# Patient Record
Sex: Female | Born: 1940 | Race: White | Hispanic: No | Marital: Married | Smoking: Never smoker
Health system: Southern US, Community
[De-identification: ages and names within clinical notes are randomized; demographics above are authoritative.]

## PROBLEM LIST (undated history)

## (undated) DIAGNOSIS — E785 Hyperlipidemia, unspecified: Secondary | ICD-10-CM

## (undated) DIAGNOSIS — T7840XA Allergy, unspecified, initial encounter: Secondary | ICD-10-CM

## (undated) DIAGNOSIS — T4145XA Adverse effect of unspecified anesthetic, initial encounter: Secondary | ICD-10-CM

## (undated) DIAGNOSIS — M1990 Unspecified osteoarthritis, unspecified site: Secondary | ICD-10-CM

## (undated) DIAGNOSIS — K219 Gastro-esophageal reflux disease without esophagitis: Secondary | ICD-10-CM

## (undated) DIAGNOSIS — N390 Urinary tract infection, site not specified: Secondary | ICD-10-CM

## (undated) DIAGNOSIS — I10 Essential (primary) hypertension: Secondary | ICD-10-CM

## (undated) DIAGNOSIS — J45909 Unspecified asthma, uncomplicated: Secondary | ICD-10-CM

## (undated) DIAGNOSIS — T8859XA Other complications of anesthesia, initial encounter: Secondary | ICD-10-CM

## (undated) HISTORY — PX: CHOLECYSTECTOMY: SHX55

## (undated) HISTORY — DX: Allergy, unspecified, initial encounter: T78.40XA

## (undated) HISTORY — PX: EYE SURGERY: SHX253

## (undated) HISTORY — DX: Unspecified asthma, uncomplicated: J45.909

## (undated) HISTORY — DX: Hyperlipidemia, unspecified: E78.5

## (undated) HISTORY — DX: Urinary tract infection, site not specified: N39.0

## (undated) HISTORY — DX: Gastro-esophageal reflux disease without esophagitis: K21.9

## (undated) HISTORY — PX: JOINT REPLACEMENT: SHX530

## (undated) HISTORY — DX: Essential (primary) hypertension: I10

---

## 1997-09-25 HISTORY — PX: TOTAL HIP ARTHROPLASTY: SHX124

## 1998-01-20 ENCOUNTER — Ambulatory Visit (HOSPITAL_COMMUNITY): Admission: RE | Admit: 1998-01-20 | Discharge: 1998-01-20 | Payer: Self-pay | Admitting: Specialist

## 1998-08-03 ENCOUNTER — Encounter: Payer: Self-pay | Admitting: Orthopedic Surgery

## 1998-08-10 ENCOUNTER — Inpatient Hospital Stay (HOSPITAL_COMMUNITY): Admission: RE | Admit: 1998-08-10 | Discharge: 1998-08-13 | Payer: Self-pay | Admitting: Orthopedic Surgery

## 1998-08-10 ENCOUNTER — Encounter: Payer: Self-pay | Admitting: Orthopedic Surgery

## 1998-08-13 ENCOUNTER — Inpatient Hospital Stay (HOSPITAL_COMMUNITY)
Admission: RE | Admit: 1998-08-13 | Discharge: 1998-08-23 | Payer: Self-pay | Admitting: Physical Medicine and Rehabilitation

## 1998-08-14 ENCOUNTER — Encounter: Payer: Self-pay | Admitting: Physical Medicine and Rehabilitation

## 1998-08-15 ENCOUNTER — Encounter: Payer: Self-pay | Admitting: Physical Medicine and Rehabilitation

## 1998-08-18 ENCOUNTER — Encounter: Payer: Self-pay | Admitting: Orthopedic Surgery

## 1998-09-03 ENCOUNTER — Ambulatory Visit (HOSPITAL_COMMUNITY): Admission: RE | Admit: 1998-09-03 | Discharge: 1998-09-03 | Payer: Self-pay | Admitting: Specialist

## 2006-12-30 ENCOUNTER — Inpatient Hospital Stay (HOSPITAL_COMMUNITY): Admission: EM | Admit: 2006-12-30 | Discharge: 2007-01-02 | Payer: Self-pay | Admitting: Emergency Medicine

## 2007-04-22 ENCOUNTER — Encounter: Admission: RE | Admit: 2007-04-22 | Discharge: 2007-07-21 | Payer: Self-pay | Admitting: Orthopedic Surgery

## 2007-07-22 ENCOUNTER — Encounter: Admission: RE | Admit: 2007-07-22 | Discharge: 2007-09-25 | Payer: Self-pay | Admitting: Orthopedic Surgery

## 2007-09-26 ENCOUNTER — Encounter: Admission: RE | Admit: 2007-09-26 | Discharge: 2007-09-26 | Payer: Self-pay | Admitting: Orthopedic Surgery

## 2010-09-25 DIAGNOSIS — N390 Urinary tract infection, site not specified: Secondary | ICD-10-CM

## 2010-09-25 HISTORY — DX: Urinary tract infection, site not specified: N39.0

## 2011-02-10 NOTE — Discharge Summary (Signed)
Deborah Pruitt, Deborah Pruitt NO.:  [id number]   MEDICAL RECORD NO.:  [id number]          PATIENT TYPE:  INP   LOCATION:  5034                         FACILITY:  [facility]   PHYSICIAN:  Georges Lynch. [name], M.D.DATE OF BIRTH:  January 01, 1941   DATE OF ADMISSION:  12/30/2006  DATE OF DISCHARGE:  01/01/2007                               DISCHARGE SUMMARY   DISCHARGE DIAGNOSES:  1. Comminuted fracture over the left proximal tibia fibula.  2. Reflux disease.  3. Status post left total hip arthroplasty.   DISCHARGE DIAGNOSES:  1. Closed reduction with long leg casting of left proximal tibia      fibula fracture.  2. Reflux disease.  3. History of left total hip arthroplasty.   HISTORY OF PRESENT ILLNESS:  The patient is a 70 year old female who was  at home earlier the day of admission, missed a step going down the  stairs, fell twisting her left leg.  It was deformed, showed up to the  emergency room, x-rays were taken.  She was found to have a comminuted  fracture of the proximal left tib fib fracture closed.  It was elected  after evaluation, the patient's discussions, possible approach and her  current condition.  The patient elected to proceed with Dr. Darrelyn Hillock to  treat with closed manipulation and casting of her left lower extremity.  The patient was admitted for pain management.   ALLERGIES:  PREVACID.   CURRENT MEDICATIONS:  On admission Protonix 40 mg daily.   CONSULTATIONS:  Routine consults requested physical therapy, case  management.   PROCEDURE:  None.   HOSPITAL COURSE:  On December 30, 2006, the patient was admitted through the  emergency room of [facility] to the orthopedic floor on Dr.  Jeannetta Ellis service.  The patient was evaluated in the emergency room and  she was placed in a long leg cast on her left lower extremity.  The  patient then occurred 2 days followup on the hospital floor, which the  patient left lower extremity remained comfortable, her  exposed areas  remained intact.  She had pink, warm, neurovascularly intact toes.  Cast  was well-fitting.  The patient was sent for an MRI evaluation of the  knee, questionable quad tendon versus patella tendon tear.  An MRI came  back and both tendons were intact.  The patient was able to be  transitioned from IV pain medicines to p.o. meds well.  There were no  other untoward events.  She participated in the therapy and was able to  be discharged home on postop day #2 in good condition with outpatient  home health physical therapy arrangements and followup plan as  necessary.   IMAGING:  An EKG on admission was 61 beats per minute, sinus rhythm with  a sinus arrhythmia.  An MRI of the left lower extremity showed complex,  severe comminuted tibial fracture, fibular head and neck fractures.  Intact ligament structures, no meniscal tears.  Large lipohemia  arthrosis.  Intact quadriceps and patella tendons.  Distal patella  tendinopathy is noted.  Partial muscle tears involving the popliteus and  lateral head  of the gastroc.  The few patchy areas, significant  abnormality of the tib fib may be secondary to osteoporosis with some  patchy red marrow.  Can exclude metastatic disease.  Recommend clinical  correlation.   LABORATORY DATA:  A CBC on admission found the WBC was 8.4, hemoglobin  10.6, hematocrit 31.4, platelets 218.  INR was 1.6 on admission and 1.6  on discharge on Coumadin.  Routine chemistries on April 7, sodium of  138, potassium 3.7, glucose 126, BUN 7, creatinine 0.78.  Estimated JFR  was greater than 60.   ACTIVITY:  The patient is ambulate with the use of a walker,  nonweightbearing on left lower extremity.   DIET:  No restriction.   WOUND CARE:  The patient is to keep the left lower extremity elevated  and keep casting clean and dry.   FOLLOWUP:  The patient is to followup with Dr. Darrelyn Hillock the following  Monday.  The patient is to call [phone] for an  appointment.   MEDICATIONS:  1. Coumadin 5 mg daily.  2. Percocet 10/650 one tablet every 4 hours for pain as needed.  3. Protonix 40 mg daily as taken previously.  4. Multivitamins one tablet a day as previously taken.   DISPOSITION:  Home health physical therapy made with outpatient therapy  agency for ambulation training.   CONDITION ON DISCHARGE:  Improved and good.      Jamelle Rushing, P.A.    ______________________________  Georges Lynch Darrelyn Hillock, M.D.    RWK/MEDQ  D:  03/14/2007  T:  03/14/2007  Job:  [id number]

## 2011-02-10 NOTE — H&P (Signed)
NAMEJACEY, Deborah Pruitt NO.:  [id number]   MEDICAL RECORD NO.:  [id number]          PATIENT TYPE:  INP   LOCATION:  5034                         FACILITY:  [facility]   PHYSICIAN:  Georges Lynch. [name], M.D.DATE OF BIRTH:  23-Jan-1941   DATE OF ADMISSION:  12/30/2006  DATE OF DISCHARGE:                              HISTORY & PHYSICAL   CHIEF COMPLAINT:  Painful swollen left knee.   HISTORY OF PRESENT ILLNESS:  Patient is a 70 year old female who was at  home earlier today, missed a step going down the stairs when she fell  down twisting her left leg.  It was deformed.  She was having quite a  bit of pain, unable to get up off the floor.  Emergency Services were  called and she was transported to [facility] for evaluation.  Upon going to [facility], she was found to have a comminuted fracture of  the tib-fib left leg just below the tibial plateau, involving the tibial  plateau.   ALLERGIES:  PENICILLIN, MILK, AND PREVACID.   CURRENT MEDICATIONS:  Protonix 40 mg a day.   PAST MEDICAL HISTORY:  Includes GERD.   PAST SURGICAL HISTORY:  Left total hip arthroplasty in 1999.   SOCIAL HISTORY:  Patient is married.  Lives with her husband in a three-  story house.  She works as a Environmental manager.  She denies any smoking  or alcohol use.   REVIEW OF SYSTEMS:  Noncontributory in any respiratory, cardiac, GI, GU,  neurologic issues.   PHYSICAL EXAMINATION:  GENERAL:  Patient is a healthy-appearing well  developed female, conscious, alert, and appropriate.  Appears to be  uncomfortable on the Emergency Room gurney.  Her left leg does angulate  off to the lateral aspect just below the knee, significantly swollen.  HEENT:  Head was normocephalic.  Pupils equal, round and reactive.  Gross hearing is intact.  NECK:  Supple.  No palpable lymphadenopathy.  CHEST:  Clear and equal bilaterally.  No wheezes, rales, rhonchi.  HEART:  Regular rate and rhythm.  No murmurs,  rubs or gallops.  ABDOMEN:  Soft, nontender.  Bowel sounds present.  LOWER EXTREMITIES:  Her left knee was slightly angulated just distal to  the knee laterally, significantly swollen.  Motor intact lower extremity  at the foot.  The calf was significantly swollen but it was still soft.  She had good motion of her left hip without any discomfort.  Her right  lower extremity and her upper extremities were normal.  NEUROLOGIC:  She is neurologically intact.  VASCULAR:  Vascularly intact.   X-rays show that she had a complete fracture through the tibial shaft  just below the tibial plateau with fracture lines up through the tibial  plateau.  She also had a fibular fracture.  It was angulated in the  valgus deformity.   IMPRESSION:  Due to her current injuries and findings, Dr. Darrelyn Hillock would  like to treat her with a closed manipulation, so her leg was cleaned  without Betadine.  He injected 20 mL of 2% lidocaine into the fracture  region.  A closed  manipulation was performed.  A fiberglass cast was  placed.  It was bi-valved on the medial and lateral aspect.  Post  reduction x-rays appear that she does still have a little flexion at the  knee but the alignment is grossly still with a little varus deformity.  The fracture margins were overlapping.   DISPOSITION:  At this time Dr. Darrelyn Hillock is going to admit her to the  hospital for pain management.  He would also like to get MRI evaluation  of the knee.  Once the patient is comfortable, able to get up and move  around with physical therapy, we will make arrangements for Home Health  versus skilled care nursing.      Jamelle Rushing, P.A.    ______________________________  Georges Lynch Darrelyn Hillock, M.D.    RWK/MEDQ  D:  12/30/2006  T:  12/31/2006  Job:  [id number]   cc:   Windy Fast A. Darrelyn Hillock, M.D.

## 2013-01-03 ENCOUNTER — Ambulatory Visit (INDEPENDENT_AMBULATORY_CARE_PROVIDER_SITE_OTHER): Payer: BC Managed Care – PPO | Admitting: Nurse Practitioner

## 2013-01-03 DIAGNOSIS — T783XXA Angioneurotic edema, initial encounter: Secondary | ICD-10-CM

## 2013-01-03 DIAGNOSIS — R609 Edema, unspecified: Secondary | ICD-10-CM

## 2013-01-03 MED ORDER — METHYLPREDNISOLONE ACETATE 40 MG/ML IJ SUSP
40.0000 mg | Freq: Once | INTRAMUSCULAR | Status: AC
  Administered 2013-01-03: 40 mg via INTRAMUSCULAR

## 2013-01-03 MED ORDER — METHYLPREDNISOLONE ACETATE 80 MG/ML IJ SUSP: 80.0000 mg | Freq: Once | INTRAMUSCULAR | Status: DC

## 2013-01-03 NOTE — Progress Notes (Signed)
  Subjective:    Patient ID: Deborah Pruitt, female    DOB: [birth year], 72 y.o.   MRN: [id number]  HPI Patient in complaining of intermittent hives and swelling. Started 1 day ago but have been having recurring episode for 2-3 months. Benadryl and Zyrtec have helped to relieve the symptoms. No associated symptoms. Pt denies any change food, products, or medications. Hives described as itchy only, no pain. 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     Review of Systems  Constitutional: Negative.   HENT: Positive for facial swelling.   Respiratory: Negative.   Cardiovascular: Negative.   Genitourinary: Negative.   Skin: Positive for rash (around waist line, under bra, and in groin area).  Neurological: Negative.   Hematological: Negative.   Psychiatric/Behavioral: Negative.        Objective:   Physical Exam  Constitutional: She is oriented to person, place, and time. She appears well-developed and well-nourished.  HENT:  Head: Normocephalic.  Right Ear: External ear normal.  Left Ear: External ear normal.  Eyes: Pupils are equal, round, and reactive to light.  Cardiovascular: Normal rate, regular rhythm and normal heart sounds.   Pulmonary/Chest: Effort normal and breath sounds normal.  Neurological: She is alert and oriented to person, place, and time.  Skin: Skin is warm and dry.  angioedema  Psychiatric: She has a normal mood and affect. Her behavior is normal. Judgment and thought content normal.          Assessment & Plan:  1. Angioedema of lips, initial encounter Keep journal of food/beverages to determine cause of inflammation - methylPREDNISolone acetate (DEPO-MEDROL) injection 80 mg; Inject 1 mL (80 mg total) into the muscle once.  Mary-Margaret Daphine Deutscher, FNP

## 2013-01-03 NOTE — Patient Instructions (Addendum)
Angioedema Angioedema (AE) is a sudden swelling of the eyelids, lips, lobes of ears, external genitalia, skin, and other parts of the body. AE can happen by itself. It usually begins during the night and is found on awakening. It can happen with hives and other allergic reactions. Attacks can be mild and annoying, or life-threatening if the air passages swell. AE generally occurs in a short time period (over minutes to hours) and gets better in 24 to 48 hours. It usually does not cause any serious problems.  There are 2 different kinds of AE:   Allergic AE.  Nonallergic AE.  There may be an overreaction or direct stimulation of cells that are a part of the immune system (mast cells).  There may be problems with the release of chemicals made by the body that cause swelling and inflammation (kinins). AE due to kinins can be inherited from parents (hereditary), or it can develop on its own (acquired). Acquired AE either shows up before, or along with, certain diseases or is due to the body's immune system attacking parts of the body's own cells (autoimmune). CAUSES  Allergic  AE due to allergic reactions are caused by something that causes the body to react (trigger). Common triggers include:  Foods.  Medicines.  Latex.  Direct contact with certain fruits, vegetables, or animal saliva.  Insect stings. Nonallergic  Mast cell stimulation may be caused by:  Medicines.  Dyes used in X-rays.  The body's own immune system reactions to parts of the body (autoimmune disease).  Possibly, some virus infections.  AE due to problems with kinins can be hereditary or acquired. Attacks are triggered by:  Mild injury.  Dental work or any surgery.  Stress.  Sudden changes in temperature.  Exercise.  Medicines.  AE due to problems with kinins can also be due to certain medicines, especially blood pressure medicines like angiotensin-converting enzyme (ACE) inhibitors. African Americans  are at nearly 5 times greater risk of developing AE than Caucasians from ACE inhibitors. SYMPTOMS  Allergic symptoms:  Non-itchy swelling of the skin. Often the swelling is on the face and lips, but any area of the skin can swell. Sometimes, the swelling can be painful. If hives are present, there is intense itching.  Breathing problems if the air passages swell. Nonallergic symptoms:  If internal organs are involved, there may be:  Nausea.  Abdominal pain.  Vomiting.  Difficulty swallowing.  Difficulty passing urine.  Breathing problems if the air passages swell. Depending on the cause of AE, episodes may:  Only happen once (if triggers are removed or avoided).  Come back in unpredictable patterns.  Repeat for several years and then gradually fade away. DIAGNOSIS  AE is diagnosed by:   Asking questions to find out how fast the symptoms began.  Taking a family history.  Physical exam.  Diagnostic tests. Tests could include:  Allergy skin tests to see if the problem is allergic.  Blood tests to diagnose hereditary and some acquired types of AE.  Other tests to see if there is a hidden disease leading to the AE. TREATMENT  Treatment depends on the type and cause (if any) of the AE. Allergic  Allergic types of AE are treated with:  Immediate removal of the trigger or medicine (if any).  Epinephrine injection.  Steroids.  Antihistamines.  Hospitalization for severe attacks. Nonallergic  Mast cell stimulation types of AE are treated with:  Immediate removal of the trigger or medicine (if any).  Epinephrine injection.  Steroids.  Antihistamines.  Hospitalization for severe attacks.  Hereditary AE is treated with:  Medicines to prevent and treat attacks. There is little response to antihistamines, epinephrine, or steroids.  Preventive medicines before dental work or surgery.  Removing or avoiding medicines that trigger  attacks.  Hospitalization for severe attacks.  Acquired AE is treated with:  Treating underlying disease (if any).  Medicines to prevent and treat attacks. HOME CARE INSTRUCTIONS   Always carry your emergency allergy treatment medicines with you.  Wear a medical bracelet.  Avoid known triggers. SEEK MEDICAL CARE IF:   You get repeat attacks.  Your attacks are more frequent or more severe despite preventive measures.  You have hereditary AE and are considering having children. It is important to discuss the risks of passing this on to your children. SEEK IMMEDIATE MEDICAL CARE IF:   You have difficulty breathing.  You have difficulty swallowing.  You experience fainting. This condition should be treated immediately. It can be life-threatening if it involves throat swelling. Document Released: 11/20/2001 Document Revised: 12/04/2011 Document Reviewed: 09/10/2008 [facility] Patient Information 2013 [place], [place].   KEEP JOURNAL OF FOOD AND BEVERAGE YOU ARE INGESTING!!

## 2013-01-17 ENCOUNTER — Ambulatory Visit: Payer: Self-pay | Admitting: Nurse Practitioner

## 2013-01-25 ENCOUNTER — Other Ambulatory Visit: Payer: Self-pay | Admitting: Nurse Practitioner

## 2013-01-28 ENCOUNTER — Other Ambulatory Visit (INDEPENDENT_AMBULATORY_CARE_PROVIDER_SITE_OTHER): Payer: BC Managed Care – PPO

## 2013-01-28 DIAGNOSIS — Z0000 Encounter for general adult medical examination without abnormal findings: Secondary | ICD-10-CM

## 2013-01-28 DIAGNOSIS — E119 Type 2 diabetes mellitus without complications: Secondary | ICD-10-CM

## 2013-01-28 LAB — COMPREHENSIVE METABOLIC PANEL
ALT: 16 U/L (ref 0–35)
AST: 14 U/L (ref 0–37)
Calcium: 9.1 mg/dL (ref 8.4–10.5)
Chloride: 107 mEq/L (ref 96–112)
Creat: 0.83 mg/dL (ref 0.50–1.10)
Potassium: 4.5 mEq/L (ref 3.5–5.3)

## 2013-01-28 NOTE — Progress Notes (Unsigned)
Patient came in for labs only.

## 2013-01-30 ENCOUNTER — Encounter: Payer: Self-pay | Admitting: General Practice

## 2013-01-30 ENCOUNTER — Ambulatory Visit (INDEPENDENT_AMBULATORY_CARE_PROVIDER_SITE_OTHER): Payer: BC Managed Care – PPO | Admitting: General Practice

## 2013-01-30 VITALS — BP 148/76 | HR 75 | Temp 98.6°F | Ht 60.0 in | Wt 172.5 lb

## 2013-01-30 DIAGNOSIS — Z0000 Encounter for general adult medical examination without abnormal findings: Secondary | ICD-10-CM

## 2013-01-30 LAB — NMR LIPOPROFILE WITH LIPIDS
Cholesterol, Total: 151 mg/dL (ref <200)
HDL-C: 40 mg/dL (ref >=40)
LDL (calc): 89 mg/dL (ref <100)
LDL Particle Number: 1283 nmol/L — ABNORMAL HIGH (ref <1000)
LP-IR Score: 54 — ABNORMAL HIGH (ref <=45)
Small LDL Particle Number: 999 nmol/L — ABNORMAL HIGH (ref <=527)
Triglycerides: 111 mg/dL (ref <150)
VLDL Size: 48.7 nm — ABNORMAL HIGH (ref <=46.6)

## 2013-01-30 NOTE — Progress Notes (Signed)
  Subjective:    Patient ID: Deborah Pruitt, female    DOB: 1940/10/03, 72 y.o.   MRN: [id number]  HPI Presents today for physical examination. Reports taking medications as directed. Denies any complaints at this time. Reports having eyes examined. Reports being up to date with mammogram and will schedule pap.    Review of Systems  Constitutional: Negative for fever, chills, activity change, appetite change and fatigue.  HENT: Negative for ear pain, neck pain and neck stiffness.   Eyes: Negative for pain.  Respiratory: Negative for cough, chest tightness and shortness of breath.   Cardiovascular: Negative for chest pain.  Gastrointestinal: Negative for abdominal pain and abdominal distention.  Genitourinary: Negative for difficulty urinating and pelvic pain.  Musculoskeletal: Negative for back pain.  Skin: Negative.   Neurological: Negative for dizziness, syncope and headaches.  Psychiatric/Behavioral: Negative.        Objective:   Physical Exam  Constitutional: She is oriented to person, place, and time. She appears well-developed and well-nourished.  HENT:  Head: Normocephalic and atraumatic.  Right Ear: External ear normal.  Left Ear: External ear normal.  Nose: Nose normal.  Eyes: Conjunctivae and EOM are normal.  Neck: Normal range of motion. No thyromegaly present.  Cardiovascular: Normal rate, regular rhythm and normal heart sounds.   No murmur heard. Pulmonary/Chest: Effort normal and breath sounds normal.  Abdominal: Soft. She exhibits no distension and no mass. There is no tenderness. There is no rebound and no guarding.  Musculoskeletal: Normal range of motion.  Lymphadenopathy:    She has no cervical adenopathy.  Neurological: She is alert and oriented to person, place, and time.  Skin: Skin is warm and dry.  Psychiatric: She has a normal mood and affect.          Assessment & Plan:  Continue current medications Continue healthy diet and exercise   Patient verbalized understanding Coralie Keens, FNP-C

## 2013-01-30 NOTE — Patient Instructions (Signed)

## 2013-02-06 ENCOUNTER — Telehealth: Payer: Self-pay | Admitting: General Practice

## 2013-02-06 NOTE — Telephone Encounter (Signed)
pt aware of labs

## 2013-03-17 ENCOUNTER — Other Ambulatory Visit: Payer: Self-pay | Admitting: General Practice

## 2013-04-30 ENCOUNTER — Other Ambulatory Visit: Payer: Self-pay | Admitting: *Deleted

## 2013-04-30 ENCOUNTER — Other Ambulatory Visit: Payer: Self-pay | Admitting: General Practice

## 2013-04-30 MED ORDER — AMLODIPINE BESYLATE 10 MG PO TABS: 10.0000 mg | ORAL_TABLET | Freq: Every day | ORAL | Status: DC

## 2013-08-19 ENCOUNTER — Encounter (INDEPENDENT_AMBULATORY_CARE_PROVIDER_SITE_OTHER): Payer: Self-pay

## 2013-08-19 ENCOUNTER — Encounter: Payer: Self-pay | Admitting: Family Medicine

## 2013-08-19 ENCOUNTER — Ambulatory Visit (INDEPENDENT_AMBULATORY_CARE_PROVIDER_SITE_OTHER): Payer: BC Managed Care – PPO | Admitting: Family Medicine

## 2013-08-19 VITALS — BP 145/82 | HR 65 | Temp 98.4°F | Ht 60.0 in | Wt 174.0 lb

## 2013-08-19 DIAGNOSIS — I10 Essential (primary) hypertension: Secondary | ICD-10-CM

## 2013-08-19 DIAGNOSIS — Z23 Encounter for immunization: Secondary | ICD-10-CM

## 2013-08-19 DIAGNOSIS — E785 Hyperlipidemia, unspecified: Secondary | ICD-10-CM

## 2013-08-19 DIAGNOSIS — K219 Gastro-esophageal reflux disease without esophagitis: Secondary | ICD-10-CM | POA: Insufficient documentation

## 2013-08-19 MED ORDER — PANTOPRAZOLE SODIUM 40 MG PO TBEC: 40.0000 mg | DELAYED_RELEASE_TABLET | Freq: Every day | ORAL | Status: DC

## 2013-08-19 MED ORDER — AMLODIPINE BESYLATE 10 MG PO TABS: 10.0000 mg | ORAL_TABLET | Freq: Every day | ORAL | Status: DC

## 2013-08-19 MED ORDER — ATORVASTATIN CALCIUM 40 MG PO TABS: 40.0000 mg | ORAL_TABLET | Freq: Every day | ORAL | Status: DC

## 2013-08-19 MED ORDER — OMEGA-3-ACID ETHYL ESTERS 1 G PO CAPS: 1.0000 g | ORAL_CAPSULE | Freq: Every day | ORAL | Status: DC

## 2013-08-19 NOTE — Progress Notes (Signed)
Subjective:    Patient ID: Deborah Pruitt, female    DOB: [birth year], 72 y.o.   MRN: [id number]  Hypertension This is a chronic problem. The current episode started more than 1 year ago. The problem has been waxing and waning since onset. The problem is uncontrolled (Pt hasn't taken bp meds this AM). Associated symptoms include peripheral edema. Pertinent negatives include no anxiety, blurred vision, chest pain, headaches, palpitations or shortness of breath. Risk factors for coronary artery disease include dyslipidemia, post-menopausal state and family history. Past treatments include calcium channel blockers. The current treatment provides mild improvement. Compliance problems include exercise.  There is no history of CAD/MI or a thyroid problem. There is no history of sleep apnea.  Hyperlipidemia This is a chronic problem. The current episode started more than 1 year ago. The problem is controlled. Recent lipid tests were reviewed and are normal. She has no history of diabetes, hypothyroidism or nephrotic syndrome. Factors aggravating her hyperlipidemia include fatty foods. Pertinent negatives include no chest pain, focal sensory loss, leg pain or shortness of breath. Current antihyperlipidemic treatment includes statins and herbal therapy. The current treatment provides moderate improvement of lipids. Risk factors for coronary artery disease include dyslipidemia, family history, hypertension, obesity and post-menopausal.  Gastrophageal Reflux She reports no abdominal pain, no chest pain, no coughing, no heartburn or no nausea. This is a chronic problem. The current episode started more than 1 year ago. The problem occurs rarely. The problem has been resolved. The symptoms are aggravated by certain foods. Pertinent negatives include no fatigue, muscle weakness or orthopnea. She has tried a PPI for the symptoms. The treatment provided significant relief. Past invasive treatments do not include reflux  surgery.      Review of Systems  Constitutional: Negative for fatigue.  Eyes: Negative for blurred vision.  Respiratory: Negative.  Negative for cough and shortness of breath.   Cardiovascular: Negative.  Negative for chest pain and palpitations.  Gastrointestinal: Negative.  Negative for heartburn, nausea and abdominal pain.  Genitourinary: Negative.   Musculoskeletal: Negative for muscle weakness.  Neurological: Negative for headaches.  All other systems reviewed and are negative.       Objective:   Physical Exam  Vitals reviewed. Constitutional: She is oriented to person, place, and time. She appears well-developed and well-nourished.  HENT:  Head: Normocephalic.  Right Ear: External ear normal.  Left Ear: External ear normal.  Mouth/Throat: Oropharynx is clear and moist.  Eyes: Pupils are equal, round, and reactive to light.  Neck: Normal range of motion. Neck supple. No thyromegaly present.  Cardiovascular: Normal rate, regular rhythm, normal heart sounds and intact distal pulses.   No murmur heard. Pulmonary/Chest: Effort normal and breath sounds normal. No respiratory distress. She has no wheezes.  Abdominal: Soft. Bowel sounds are normal. She exhibits no distension. There is no tenderness.  Musculoskeletal: Normal range of motion. She exhibits no edema and no tenderness.  Neurological: She is alert and oriented to person, place, and time.  Skin: Skin is warm and dry. No rash noted. No erythema.  Psychiatric: She has a normal mood and affect. Her behavior is normal. Judgment and thought content normal.     BP 145/82  Pulse 65  Temp(Src) 98.4 F (36.9 C) (Oral)  Ht 5' (1.524 m)  Wt 174 lb (78.926 kg)  BMI 33.98 kg/m2      Assessment & Plan:  Hypertension - Plan: CMP14+EGFR  Hyperlipidemia LDL goal < 100 - Plan: CMP14+EGFR, Lipid panel  GERD (  gastroesophageal reflux disease)  Deatra Canter FNP

## 2013-08-19 NOTE — Addendum Note (Signed)
Addended by: Bearl Mulberry on: 08/19/2013 09:45 AM   Modules accepted: Orders

## 2013-08-19 NOTE — Patient Instructions (Signed)

## 2013-08-20 LAB — LIPID PANEL
Chol/HDL Ratio: 3.2 ratio units (ref 0.0–4.4)
Cholesterol, Total: 113 mg/dL (ref 100–199)
HDL: 35 mg/dL — ABNORMAL LOW (ref >39)
LDL Calculated: 62 mg/dL (ref 0–99)
Triglycerides: 81 mg/dL (ref 0–149)
VLDL Cholesterol Cal: 16 mg/dL (ref 5–40)

## 2013-08-20 LAB — CMP14+EGFR
ALT: 20 IU/L (ref 0–32)
AST: 17 IU/L (ref 0–40)
Albumin/Globulin Ratio: 2 (ref 1.1–2.5)
Albumin: 4.3 g/dL (ref 3.5–4.8)
Alkaline Phosphatase: 70 IU/L (ref 39–117)
BUN/Creatinine Ratio: 12 (ref 11–26)
BUN: 11 mg/dL (ref 8–27)
CO2: 23 mmol/L (ref 18–29)
Calcium: 9 mg/dL (ref 8.6–10.2)
Chloride: 105 mmol/L (ref 97–108)
Creatinine, Ser: 0.89 mg/dL (ref 0.57–1.00)
GFR calc Af Amer: 76 mL/min/{1.73_m2} (ref >59)
GFR calc non Af Amer: 66 mL/min/{1.73_m2} (ref >59)
Globulin, Total: 2.1 g/dL (ref 1.5–4.5)
Glucose: 110 mg/dL — ABNORMAL HIGH (ref 65–99)
Potassium: 4.5 mmol/L (ref 3.5–5.2)
Sodium: 143 mmol/L (ref 134–144)
Total Bilirubin: 0.5 mg/dL (ref 0.0–1.2)
Total Protein: 6.4 g/dL (ref 6.0–8.5)

## 2013-08-26 ENCOUNTER — Telehealth: Payer: Self-pay | Admitting: *Deleted

## 2013-08-26 NOTE — Telephone Encounter (Deleted)
Message copied by Bernita Buffy on Tue Aug 26, 2013 10:45 AM ------      Message from: Deatra Canter      Created: Mon Aug 25, 2013  1:35 PM       Labs ok ------

## 2013-09-04 NOTE — Telephone Encounter (Signed)
Taken care of per note 

## 2013-09-25 DIAGNOSIS — H269 Unspecified cataract: Secondary | ICD-10-CM

## 2013-09-25 HISTORY — DX: Unspecified cataract: H26.9

## 2013-10-16 ENCOUNTER — Ambulatory Visit (INDEPENDENT_AMBULATORY_CARE_PROVIDER_SITE_OTHER): Payer: Medicare Other | Admitting: Nurse Practitioner

## 2013-10-16 ENCOUNTER — Ambulatory Visit (INDEPENDENT_AMBULATORY_CARE_PROVIDER_SITE_OTHER): Payer: Medicare Other

## 2013-10-16 VITALS — BP 160/68 | HR 63 | Temp 98.1°F | Ht 60.0 in

## 2013-10-16 DIAGNOSIS — S93602A Unspecified sprain of left foot, initial encounter: Secondary | ICD-10-CM

## 2013-10-16 DIAGNOSIS — M25579 Pain in unspecified ankle and joints of unspecified foot: Secondary | ICD-10-CM

## 2013-10-16 DIAGNOSIS — S93609A Unspecified sprain of unspecified foot, initial encounter: Secondary | ICD-10-CM

## 2013-10-16 NOTE — Patient Instructions (Signed)
Sprain  A sprain is a tear in one of the strong, fibrous tissues that connect your bones (ligaments). The severity of the sprain depends on how much of the ligament is torn. The tear can be either partial or complete.  CAUSES   Often, sprains are a result of a fall or an injury. The force of the impact causes the fibers of your ligament to stretch beyond their normal length. This excess tension causes the fibers of your ligament to tear.  SYMPTOMS   You may have some loss of motion or increased pain within your normal range of motion. Other symptoms include:  · Bruising.  · Tenderness.  · Swelling.  DIAGNOSIS   In order to diagnose a sprain, your caregiver will physically examine you to determine how torn the ligament is. Your caregiver may also suggest an X-ray exam to make sure no bones are broken.  TREATMENT   If your ligament is only partially torn, treatment usually involves keeping the injured area in a fixed position (immobilization) for a short period. To do this, your caregiver will apply a bandage, cast, or splint to keep the area from moving until it heals. For a partially torn ligament, the healing process usually takes 2 to 3 weeks.  If your ligament is completely torn, you may need surgery to reconnect the ligament to the bone or to reconstruct the ligament. After surgery, a cast or splint may be applied and will need to stay on for 4 to 6 weeks while your ligament heals.  HOME CARE INSTRUCTIONS  · Keep the injured area elevated to decrease swelling.  · To ease pain and swelling, apply ice to your joint twice a day, for 2 to 3 days.  · Put ice in a plastic bag.  · Place a towel between your skin and the bag.  · Leave the ice on for 15 minutes.  · Only take over-the-counter or prescription medicine for pain as directed by your caregiver.  · Do not leave the injured area unprotected until pain and stiffness go away (usually 3 to 4 weeks).  · Do not allow your cast or splint to get wet. Cover your cast or  splint with a plastic bag when you shower or bathe. Do not swim.  · Your caregiver may suggest exercises for you to do during your recovery to prevent or limit permanent stiffness.  SEEK IMMEDIATE MEDICAL CARE IF:  · Your cast or splint becomes damaged.  · Your pain becomes worse.  MAKE SURE YOU:  · Understand these instructions.  · Will watch your condition.  · Will get help right away if you are not doing well or get worse.  Document Released: 09/08/2000 Document Revised: 12/04/2011 Document Reviewed: 09/23/2011  [employer]® Patient Information ©2014 [employer].

## 2013-10-16 NOTE — Progress Notes (Signed)
   Subjective:    Patient ID: Deborah Pruitt, female    DOB: 05/02/1941, 73 y.o.   MRN: [id number]  HPI  Patient in c/o twisting her left foot- painful to walk on. Happened earlier this morning.   Review of Systems  All other systems reviewed and are negative.       Objective:   Physical Exam  Constitutional: She appears well-developed and well-nourished.  Cardiovascular: Normal rate, regular rhythm and normal heart sounds.   Pulmonary/Chest: Effort normal and breath sounds normal.  Musculoskeletal:  From of left foot with pain on dorsal surface with movement in any direction- mild edema along talus with tenderness to palpation.  BP 160/68  Pulse 63  Temp(Src) 98.1 F (36.7 C) (Oral)  Ht 5' (1.524 m)   Left foot xray- no fracture sen-Preliminary reading by Paulene FloorMary Elaysia Devargas, FNP  [facility]       Assessment & Plan:   1. Pain in joint, ankle and foot   2. Sprain of foot, left    Tylenol OTC Wrap foot when up Elevate and ice TID RTO prn Deborah Daphine DeutscherMartin, FNP

## 2014-01-14 ENCOUNTER — Other Ambulatory Visit: Payer: Self-pay | Admitting: General Practice

## 2014-01-15 NOTE — Telephone Encounter (Signed)
ntbs for antibiotic refill 

## 2014-01-15 NOTE — Telephone Encounter (Signed)
Last seen 10/16/13  MMM

## 2014-02-22 ENCOUNTER — Other Ambulatory Visit: Payer: Self-pay | Admitting: Nurse Practitioner

## 2014-02-23 NOTE — Telephone Encounter (Signed)
Seen 08/19/13 and lipid 08/19/13  [facility] [place]

## 2014-05-12 ENCOUNTER — Other Ambulatory Visit: Payer: Self-pay | Admitting: Family Medicine

## 2014-05-21 ENCOUNTER — Ambulatory Visit (INDEPENDENT_AMBULATORY_CARE_PROVIDER_SITE_OTHER): Payer: Medicare Other | Admitting: Family

## 2014-05-21 ENCOUNTER — Encounter: Payer: Self-pay | Admitting: Family

## 2014-05-21 VITALS — BP 179/78 | HR 62 | Temp 98.4°F | Ht 60.0 in | Wt 159.0 lb

## 2014-05-21 DIAGNOSIS — Z13820 Encounter for screening for osteoporosis: Secondary | ICD-10-CM

## 2014-05-21 DIAGNOSIS — K219 Gastro-esophageal reflux disease without esophagitis: Secondary | ICD-10-CM

## 2014-05-21 DIAGNOSIS — Z1321 Encounter for screening for nutritional disorder: Secondary | ICD-10-CM

## 2014-05-21 DIAGNOSIS — I10 Essential (primary) hypertension: Secondary | ICD-10-CM

## 2014-05-21 DIAGNOSIS — E785 Hyperlipidemia, unspecified: Secondary | ICD-10-CM

## 2014-05-21 DIAGNOSIS — Z1211 Encounter for screening for malignant neoplasm of colon: Secondary | ICD-10-CM

## 2014-05-21 MED ORDER — PANTOPRAZOLE SODIUM 40 MG PO TBEC
40.0000 mg | DELAYED_RELEASE_TABLET | Freq: Every day | ORAL | Status: DC
Start: 2014-05-21 — End: 2015-05-12

## 2014-05-21 MED ORDER — HYDROCHLOROTHIAZIDE 12.5 MG PO TABS: 12.5000 mg | ORAL_TABLET | Freq: Every day | ORAL | Status: DC

## 2014-05-21 MED ORDER — AMLODIPINE BESYLATE 10 MG PO TABS: 10.0000 mg | ORAL_TABLET | Freq: Every day | ORAL | Status: DC

## 2014-05-21 MED ORDER — OMEGA-3-ACID ETHYL ESTERS 1 G PO CAPS: ORAL_CAPSULE | ORAL | Status: DC

## 2014-05-21 NOTE — Patient Instructions (Signed)
Hypertension Hypertension, commonly called high blood pressure, is when the force of blood pumping through your arteries is too strong. Your arteries are the blood vessels that carry blood from your heart throughout your body. A blood pressure reading consists of a higher number over a lower number, such as 110/72. The higher number (systolic) is the pressure inside your arteries when your heart pumps. The lower number (diastolic) is the pressure inside your arteries when your heart relaxes. Ideally you want your blood pressure below 120/80. Hypertension forces your heart to work harder to pump blood. Your arteries may become narrow or stiff. Having hypertension puts you at risk for heart disease, stroke, and other problems.  RISK FACTORS Some risk factors for high blood pressure are controllable. Others are not.  Risk factors you cannot control include:   Race. You may be at higher risk if you are African American.  Age. Risk increases with age.  Gender. Men are at higher risk than women before age 45 years. After age 65, women are at higher risk than men. Risk factors you can control include:  Not getting enough exercise or physical activity.  Being overweight.  Getting too much fat, sugar, calories, or salt in your diet.  Drinking too much alcohol. SIGNS AND SYMPTOMS Hypertension does not usually cause signs or symptoms. Extremely high blood pressure (hypertensive crisis) may cause headache, anxiety, shortness of breath, and nosebleed. DIAGNOSIS  To check if you have hypertension, your health care provider will measure your blood pressure while you are seated, with your arm held at the level of your heart. It should be measured at least twice using the same arm. Certain conditions can cause a difference in blood pressure between your right and left arms. A blood pressure reading that is higher than normal on one occasion does not mean that you need treatment. If one blood pressure reading  is high, ask your health care provider about having it checked again. TREATMENT  Treating high blood pressure includes making lifestyle changes and possibly taking medicine. Living a healthy lifestyle can help lower high blood pressure. You may need to change some of your habits. Lifestyle changes may include:  Following the DASH diet. This diet is high in fruits, vegetables, and whole grains. It is low in salt, red meat, and added sugars.  Getting at least 2 hours of brisk physical activity every week.  Losing weight if necessary.  Not smoking.  Limiting alcoholic beverages.  Learning ways to reduce stress. If lifestyle changes are not enough to get your blood pressure under control, your health care provider may prescribe medicine. You may need to take more than one. Work closely with your health care provider to understand the risks and benefits. HOME CARE INSTRUCTIONS  Have your blood pressure rechecked as directed by your health care provider.   Take medicines only as directed by your health care provider. Follow the directions carefully. Blood pressure medicines must be taken as prescribed. The medicine does not work as well when you skip doses. Skipping doses also puts you at risk for problems.   Do not smoke.   Monitor your blood pressure at home as directed by your health care provider. SEEK MEDICAL CARE IF:   You think you are having a reaction to medicines taken.  You have recurrent headaches or feel dizzy.  You have swelling in your ankles.  You have trouble with your vision. SEEK IMMEDIATE MEDICAL CARE IF:  You develop a severe headache or confusion.    You have unusual weakness, numbness, or feel faint.  You have severe chest or abdominal pain.  You vomit repeatedly.  You have trouble breathing. MAKE SURE YOU:   Understand these instructions.  Will watch your condition.  Will get help right away if you are not doing well or get worse. Document  Released: 09/11/2005 Document Revised: 01/26/2014 Document Reviewed: 07/04/2013 [facility] Patient Information 2015 [place], [place]. This information is not intended to replace advice given to you by your health care provider. Make sure you discuss any questions you have with your health care provider. Health Maintenance Adopting a healthy lifestyle and getting preventive care can go a long way to promote health and wellness. Talk with your health care provider about what schedule of regular examinations is right for you. This is a good chance for you to check in with your provider about disease prevention and staying healthy. In between checkups, there are plenty of things you can do on your own. Experts have done a lot of research about which lifestyle changes and preventive measures are most likely to keep you healthy. Ask your health care provider for more information. WEIGHT AND DIET  Eat a healthy diet  Be sure to include plenty of vegetables, fruits, low-fat dairy products, and lean protein.  Do not eat a lot of foods high in solid fats, added sugars, or salt.  Get regular exercise. This is one of the most important things you can do for your health.  Most adults should exercise for at least 150 minutes each week. The exercise should increase your heart rate and make you sweat (moderate-intensity exercise).  Most adults should also do strengthening exercises at least twice a week. This is in addition to the moderate-intensity exercise.  Maintain a healthy weight  Body mass index (BMI) is a measurement that can be used to identify possible weight problems. It estimates body fat based on height and weight. Your health care provider can help determine your BMI and help you achieve or maintain a healthy weight.  For females 71 years of age and older:   A BMI below 18.5 is considered underweight.  A BMI of 18.5 to 24.9 is normal.  A BMI of 25 to 29.9 is considered overweight.  A BMI of  30 and above is considered obese.  Watch levels of cholesterol and blood lipids  You should start having your blood tested for lipids and cholesterol at 71 years of age, then have this test every 5 years.  You may need to have your cholesterol levels checked more often if:  Your lipid or cholesterol levels are high.  You are older than 70 years of age.  You are at high risk for heart disease.  CANCER SCREENING   Lung Cancer  Lung cancer screening is recommended for adults 21-41 years old who are at high risk for lung cancer because of a history of smoking.  A yearly low-dose CT scan of the lungs is recommended for people who:  Currently smoke.  Have quit within the past 15 years.  Have at least a 30-pack-year history of smoking. A pack year is smoking an average of one pack of cigarettes a day for 1 year.  Yearly screening should continue until it has been 15 years since you quit.  Yearly screening should stop if you develop a health problem that would prevent you from having lung cancer treatment.  Breast Cancer  Practice breast self-awareness. This means understanding how your breasts normally appear and  feel.  It also means doing regular breast self-exams. Let your health care provider know about any changes, no matter how small.  If you are in your 20s or 30s, you should have a clinical breast exam (CBE) by a health care provider every 1-3 years as part of a regular health exam.  If you are 75 or older, have a CBE every year. Also consider having a breast X-ray (mammogram) every year.  If you have a family history of breast cancer, talk to your health care provider about genetic screening.  If you are at high risk for breast cancer, talk to your health care provider about having an MRI and a mammogram every year.  Breast cancer gene (BRCA) assessment is recommended for women who have family members with BRCA-related cancers. BRCA-related cancers  include:  Breast.  Ovarian.  Tubal.  Peritoneal cancers.  Results of the assessment will determine the need for genetic counseling and BRCA1 and BRCA2 testing. Cervical Cancer Routine pelvic examinations to screen for cervical cancer are no longer recommended for nonpregnant women who are considered low risk for cancer of the pelvic organs (ovaries, uterus, and vagina) and who do not have symptoms. A pelvic examination may be necessary if you have symptoms including those associated with pelvic infections. Ask your health care provider if a screening pelvic exam is right for you.   The Pap test is the screening test for cervical cancer for women who are considered at risk.  If you had a hysterectomy for a problem that was not cancer or a condition that could lead to cancer, then you no longer need Pap tests.  If you are older than 65 years, and you have had normal Pap tests for the past 10 years, you no longer need to have Pap tests.  If you have had past treatment for cervical cancer or a condition that could lead to cancer, you need Pap tests and screening for cancer for at least 20 years after your treatment.  If you no longer get a Pap test, assess your risk factors if they change (such as having a new sexual partner). This can affect whether you should start being screened again.  Some women have medical problems that increase their chance of getting cervical cancer. If this is the case for you, your health care provider may recommend more frequent screening and Pap tests.  The human papillomavirus (HPV) test is another test that may be used for cervical cancer screening. The HPV test looks for the virus that can cause cell changes in the cervix. The cells collected during the Pap test can be tested for HPV.  The HPV test can be used to screen women 62 years of age and older. Getting tested for HPV can extend the interval between normal Pap tests from three to five years.  An HPV  test also should be used to screen women of any age who have unclear Pap test results.  After 71 years of age, women should have HPV testing as often as Pap tests.  Colorectal Cancer  This type of cancer can be detected and often prevented.  Routine colorectal cancer screening usually begins at 71 years of age and continues through 71 years of age.  Your health care provider may recommend screening at an earlier age if you have risk factors for colon cancer.  Your health care provider may also recommend using home test kits to check for hidden blood in the stool.  A small camera  at the end of a tube can be used to examine your colon directly (sigmoidoscopy or colonoscopy). This is done to check for the earliest forms of colorectal cancer.  Routine screening usually begins at age 28.  Direct examination of the colon should be repeated every 5-10 years through 71 years of age. However, you may need to be screened more often if early forms of precancerous polyps or small growths are found. Skin Cancer  Check your skin from head to toe regularly.  Tell your health care provider about any new moles or changes in moles, especially if there is a change in a mole's shape or color.  Also tell your health care provider if you have a mole that is larger than the size of a pencil eraser.  Always use sunscreen. Apply sunscreen liberally and repeatedly throughout the day.  Protect yourself by wearing long sleeves, pants, a wide-brimmed hat, and sunglasses whenever you are outside. HEART DISEASE, DIABETES, AND HIGH BLOOD PRESSURE   Have your blood pressure checked at least every 1-2 years. High blood pressure causes heart disease and increases the risk of stroke.  If you are between 20 years and 33 years old, ask your health care provider if you should take aspirin to prevent strokes.  Have regular diabetes screenings. This involves taking a blood sample to check your fasting blood sugar  level.  If you are at a normal weight and have a low risk for diabetes, have this test once every three years after 71 years of age.  If you are overweight and have a high risk for diabetes, consider being tested at a younger age or more often. PREVENTING INFECTION  Hepatitis B  If you have a higher risk for hepatitis B, you should be screened for this virus. You are considered at high risk for hepatitis B if:  You were born in a country where hepatitis B is common. Ask your health care provider which countries are considered high risk.  Your parents were born in a high-risk country, and you have not been immunized against hepatitis B (hepatitis B vaccine).  You have HIV or AIDS.  You use needles to inject street drugs.  You live with someone who has hepatitis B.  You have had sex with someone who has hepatitis B.  You get hemodialysis treatment.  You take certain medicines for conditions, including cancer, organ transplantation, and autoimmune conditions. Hepatitis C  Blood testing is recommended for:  Everyone born from [birth year] through 1963.  Anyone with known risk factors for hepatitis C. Sexually transmitted infections (STIs)  You should be screened for sexually transmitted infections (STIs) including gonorrhea and chlamydia if:  You are sexually active and are younger than 71 years of age.  You are older than 71 years of age and your health care provider tells you that you are at risk for this type of infection.  Your sexual activity has changed since you were last screened and you are at an increased risk for chlamydia or gonorrhea. Ask your health care provider if you are at risk.  If you do not have HIV, but are at risk, it may be recommended that you take a prescription medicine daily to prevent HIV infection. This is called pre-exposure prophylaxis (PrEP). You are considered at risk if:  You are sexually active and do not regularly use condoms or know the HIV status  of your partner(s).  You take drugs by injection.  You are sexually active with a partner  who has HIV. Talk with your health care provider about whether you are at high risk of being infected with HIV. If you choose to begin PrEP, you should first be tested for HIV. You should then be tested every 3 months for as long as you are taking PrEP.  PREGNANCY   If you are premenopausal and you may become pregnant, ask your health care provider about preconception counseling.  If you may become pregnant, take 400 to 800 micrograms (mcg) of folic acid every day.  If you want to prevent pregnancy, talk to your health care provider about birth control (contraception). OSTEOPOROSIS AND MENOPAUSE   Osteoporosis is a disease in which the bones lose minerals and strength with aging. This can result in serious bone fractures. Your risk for osteoporosis can be identified using a bone density scan.  If you are 38 years of age or older, or if you are at risk for osteoporosis and fractures, ask your health care provider if you should be screened.  Ask your health care provider whether you should take a calcium or vitamin D supplement to lower your risk for osteoporosis.  Menopause may have certain physical symptoms and risks.  Hormone replacement therapy may reduce some of these symptoms and risks. Talk to your health care provider about whether hormone replacement therapy is right for you.  HOME CARE INSTRUCTIONS   Schedule regular health, dental, and eye exams.  Stay current with your immunizations.   Do not use any tobacco products including cigarettes, chewing tobacco, or electronic cigarettes.  If you are pregnant, do not drink alcohol.  If you are breastfeeding, limit how much and how often you drink alcohol.  Limit alcohol intake to no more than 1 drink per day for nonpregnant women. One drink equals 12 ounces of beer, 5 ounces of wine, or 1 ounces of hard liquor.  Do not use street  drugs.  Do not share needles.  Ask your health care provider for help if you need support or information about quitting drugs.  Tell your health care provider if you often feel depressed.  Tell your health care provider if you have ever been abused or do not feel safe at home. Document Released: 03/27/2011 Document Revised: 01/26/2014 Document Reviewed: 08/13/2013 [facility] Patient Information 2015 [place], [place]. This information is not intended to replace advice given to you by your health care provider. Make sure you discuss any questions you have with your health care provider.

## 2014-05-21 NOTE — Progress Notes (Signed)
Subjective:    Patient ID: Deborah Pruitt, female    DOB: 03-12-1941, 73 y.o.   MRN: [id number]  Hypertension This is a chronic problem. The current episode started more than 1 year ago. The problem has been waxing and waning since onset. The problem is uncontrolled. Pertinent negatives include no anxiety, headaches, palpitations, peripheral edema or shortness of breath. Risk factors for coronary artery disease include dyslipidemia, post-menopausal state and family history. Past treatments include calcium channel blockers. The current treatment provides no improvement. There is no history of kidney disease, CAD/MI, CVA, heart failure or a thyroid problem. There is no history of sleep apnea.  Hyperlipidemia This is a chronic problem. The current episode started more than 1 year ago. The problem is uncontrolled. Recent lipid tests were reviewed and are high. She has no history of diabetes or hypothyroidism. Factors aggravating her hyperlipidemia include fatty foods. Pertinent negatives include no leg pain, myalgias or shortness of breath. Current antihyperlipidemic treatment includes herbal therapy. The current treatment provides mild improvement of lipids. Risk factors for coronary artery disease include dyslipidemia, hypertension, a sedentary lifestyle and family history.  Gastrophageal Reflux She reports no belching, no choking, no heartburn, no sore throat or no water brash. This is a chronic problem. The current episode started more than 1 year ago. The problem occurs rarely. The problem has been resolved. The symptoms are aggravated by certain foods. Pertinent negatives include no muscle weakness or orthopnea. She has tried a PPI for the symptoms. The treatment provided significant relief.      Review of Systems  Constitutional: Negative.   HENT: Negative.  Negative for sore throat.   Eyes: Negative.   Respiratory: Negative.  Negative for choking and shortness of breath.     Cardiovascular: Negative.  Negative for palpitations.  Gastrointestinal: Negative.  Negative for heartburn.  Endocrine: Negative.   Genitourinary: Negative.   Musculoskeletal: Negative.  Negative for muscle weakness and myalgias.  Neurological: Negative.  Negative for headaches.  Hematological: Negative.   Psychiatric/Behavioral: Negative.   All other systems reviewed and are negative.      Objective:   Physical Exam  Vitals reviewed. Constitutional: She is oriented to person, place, and time. She appears well-developed and well-nourished. No distress.  HENT:  Head: Normocephalic and atraumatic.  Right Ear: External ear normal.  Left Ear: External ear normal.  Nose: Nose normal.  Mouth/Throat: Oropharynx is clear and moist.  Eyes: Pupils are equal, round, and reactive to light.  Neck: Normal range of motion. Neck supple. No thyromegaly present.  Cardiovascular: Normal rate, regular rhythm, normal heart sounds and intact distal pulses.   No murmur heard. Pulmonary/Chest: Effort normal and breath sounds normal. No respiratory distress. She has no wheezes.  Abdominal: Soft. Bowel sounds are normal. She exhibits no distension. There is no tenderness.  Musculoskeletal: Normal range of motion. She exhibits no edema and no tenderness.  Neurological: She is alert and oriented to person, place, and time. She has normal reflexes. No cranial nerve deficit.  Skin: Skin is warm and dry.  Psychiatric: She has a normal mood and affect. Her behavior is normal. Judgment and thought content normal.   BP 179/78  Pulse 62  Temp(Src) 98.4 F (36.9 C) (Oral)  Ht 5' (1.524 m)  Wt 159 lb (72.122 kg)  BMI 31.05 kg/m2        Assessment & Plan:  1. Gastroesophageal reflux disease without esophagitis - CMP14+EGFR - pantoprazole (PROTONIX) 40 MG tablet; Take 1 tablet (40  mg total) by mouth daily. As needed  Dispense: 30 tablet; Refill: 11  2. Essential hypertension - CMP14+EGFR - amLODipine  (NORVASC) 10 MG tablet; Take 1 tablet (10 mg total) by mouth daily.  Dispense: 30 tablet; Refill: 11 - hydrochlorothiazide (HYDRODIURIL) 12.5 MG tablet; Take 1 tablet (12.5 mg total) by mouth daily.  Dispense: 90 tablet; Refill: 3  3. Hyperlipidemia with target LDL less than 100 - CMP14+EGFR - Lipid panel - omega-3 acid ethyl esters (LOVAZA) 1 G capsule; TAKE 2 CAPSULES BY MOUTH TWICE A DAY  Dispense: 120 capsule; Refill: 11  4. Colon cancer screening - Ambulatory referral to Gastroenterology - CMP14+EGFR  5. Osteoporosis screening - DG Bone Density; Future - CMP14+EGFR  6. Encounter for vitamin deficiency screening - Vit D  25 hydroxy (rtn osteoporosis monitoring)   Continue all meds Labs pending Health Maintenance reviewed Diet and exercise encouraged RTO 2 for blood pressure recheck  Evelina Dun, FNP

## 2014-05-22 LAB — CMP14+EGFR
ALK PHOS: 60 IU/L (ref 39–117)
ALT: 17 IU/L (ref 0–32)
AST: 20 IU/L (ref 0–40)
Albumin/Globulin Ratio: 1.9 (ref 1.1–2.5)
Albumin: 4.4 g/dL (ref 3.5–4.8)
BUN / CREAT RATIO: 15 (ref 11–26)
BUN: 13 mg/dL (ref 8–27)
CALCIUM: 9.3 mg/dL (ref 8.7–10.3)
CHLORIDE: 104 mmol/L (ref 97–108)
CO2: 22 mmol/L (ref 18–29)
Creatinine, Ser: 0.84 mg/dL (ref 0.57–1.00)
GFR calc Af Amer: 81 mL/min/{1.73_m2} (ref >59)
GFR calc non Af Amer: 71 mL/min/{1.73_m2} (ref >59)
GLOBULIN, TOTAL: 2.3 g/dL (ref 1.5–4.5)
Glucose: 102 mg/dL — ABNORMAL HIGH (ref 65–99)
POTASSIUM: 4.2 mmol/L (ref 3.5–5.2)
SODIUM: 143 mmol/L (ref 134–144)
Total Bilirubin: 0.6 mg/dL (ref 0.0–1.2)
Total Protein: 6.7 g/dL (ref 6.0–8.5)

## 2014-05-22 LAB — LIPID PANEL
CHOL/HDL RATIO: 5.9 ratio — AB (ref 0.0–4.4)
CHOLESTEROL TOTAL: 190 mg/dL (ref 100–199)
HDL: 32 mg/dL — ABNORMAL LOW (ref >39)
LDL Calculated: 119 mg/dL — ABNORMAL HIGH (ref 0–99)
TRIGLYCERIDES: 197 mg/dL — AB (ref 0–149)
VLDL Cholesterol Cal: 39 mg/dL (ref 5–40)

## 2014-05-22 LAB — VITAMIN D 25 HYDROXY (VIT D DEFICIENCY, FRACTURES): Vit D, 25-Hydroxy: 55.4 ng/mL (ref 30.0–100.0)

## 2014-05-25 ENCOUNTER — Telehealth: Payer: Self-pay | Admitting: Family Medicine

## 2014-05-27 ENCOUNTER — Encounter: Payer: Self-pay | Admitting: Internal Medicine

## 2014-06-04 ENCOUNTER — Ambulatory Visit (INDEPENDENT_AMBULATORY_CARE_PROVIDER_SITE_OTHER): Payer: Medicare Other | Admitting: *Deleted

## 2014-06-04 VITALS — BP 141/75 | HR 66

## 2014-06-04 DIAGNOSIS — I10 Essential (primary) hypertension: Secondary | ICD-10-CM

## 2014-06-05 ENCOUNTER — Other Ambulatory Visit: Payer: Self-pay | Admitting: Family

## 2014-06-05 MED ORDER — HYDROCHLOROTHIAZIDE 25 MG PO TABS: 25.0000 mg | ORAL_TABLET | Freq: Every day | ORAL | Status: DC

## 2014-06-05 NOTE — Progress Notes (Signed)
Left detailed message on voicemail.  

## 2014-06-05 NOTE — Progress Notes (Signed)
Pt's blood pressure still elevated. Pt needs to continue amlodipine 10 mg daily and I increased  Hydrochlorothiazide to 25 mg daily. RX sent to pharmacy. Pt will need blood pressure rechecked within next 2 weeks or so.

## 2014-06-29 ENCOUNTER — Other Ambulatory Visit: Payer: Self-pay | Admitting: Family Medicine

## 2014-07-17 ENCOUNTER — Encounter (HOSPITAL_COMMUNITY): Payer: Self-pay | Admitting: Pharmacy Technician

## 2014-07-17 NOTE — Patient Instructions (Signed)
Your procedure is scheduled on: 07/23/2014  Report to [facility] at  930  AM.  Call this number if you have problems the morning of surgery: [phone]   Do not eat food or drink liquids :After Midnight.      Take these medicines the morning of surgery with A SIP OF WATER: amlodipine, hydrodiuril, protonix   Do not wear jewelry, make-up or nail polish.  Do not wear lotions, powders, or perfumes.   Do not shave 48 hours prior to surgery.  Do not bring valuables to the hospital.  Contacts, dentures or bridgework may not be worn into surgery.  Leave suitcase in the car. After surgery it may be brought to your room.  For patients admitted to the hospital, checkout time is 11:00 AM the day of discharge.   Patients discharged the day of surgery will not be allowed to drive home.  :     Please read over the following fact sheets that you were given: Coughing and Deep Breathing, Surgical Site Infection Prevention, Anesthesia Post-op Instructions and Care and Recovery After Surgery    Cataract A cataract is a clouding of the lens of the eye. When a lens becomes cloudy, vision is reduced based on the degree and nature of the clouding. Many cataracts reduce vision to some degree. Some cataracts make people more near-sighted as they develop. Other cataracts increase glare. Cataracts that are ignored and become worse can sometimes look white. The white color can be seen through the pupil. CAUSES   Aging. However, cataracts may occur at any age, even in newborns.   Certain drugs.   Trauma to the eye.   Certain diseases such as diabetes.   Specific eye diseases such as chronic inflammation inside the eye or a sudden attack of a rare form of glaucoma.   Inherited or acquired medical problems.  SYMPTOMS   Gradual, progressive drop in vision in the affected eye.   Severe, rapid visual loss. This most often happens when trauma is the cause.  DIAGNOSIS  To detect a cataract, an eye doctor  examines the lens. Cataracts are best diagnosed with an exam of the eyes with the pupils enlarged (dilated) by drops.  TREATMENT  For an early cataract, vision may improve by using different eyeglasses or stronger lighting. If that does not help your vision, surgery is the only effective treatment. A cataract needs to be surgically removed when vision loss interferes with your everyday activities, such as driving, reading, or watching TV. A cataract may also have to be removed if it prevents examination or treatment of another eye problem. Surgery removes the cloudy lens and usually replaces it with a substitute lens (intraocular lens, IOL).  At a time when both you and your doctor agree, the cataract will be surgically removed. If you have cataracts in both eyes, only one is usually removed at a time. This allows the operated eye to heal and be out of danger from any possible problems after surgery (such as infection or poor wound healing). In rare cases, a cataract may be doing damage to your eye. In these cases, your caregiver may advise surgical removal right away. The vast majority of people who have cataract surgery have better vision afterward. HOME CARE INSTRUCTIONS  If you are not planning surgery, you may be asked to do the following:  Use different eyeglasses.   Use stronger or brighter lighting.   Ask your eye doctor about reducing your medicine dose or changing  medicines if it is thought that a medicine caused your cataract. Changing medicines does not make the cataract go away on its own.   Become familiar with your surroundings. Poor vision can lead to injury. Avoid bumping into things on the affected side. You are at a higher risk for tripping or falling.   Exercise extreme care when driving or operating machinery.   Wear sunglasses if you are sensitive to bright [place] or experiencing problems with glare.  SEEK IMMEDIATE MEDICAL CARE IF:   You have a worsening or sudden vision  loss.   You notice redness, swelling, or increasing pain in the eye.   You have a fever.  Document Released: 09/11/2005 Document Revised: 08/31/2011 Document Reviewed: 05/05/2011 [facility] Patient Information 2012 [email or web address] INSTRUCTIONS POST-ANESTHESIA  IMMEDIATELY FOLLOWING SURGERY:  Do not drive or operate machinery for the first twenty four hours after surgery.  Do not make any important decisions for twenty four hours after surgery or while taking narcotic pain medications or sedatives.  If you develop intractable nausea and vomiting or a severe headache please notify your doctor immediately.  FOLLOW-UP:  Please make an appointment with your surgeon as instructed. You do not need to follow up with anesthesia unless specifically instructed to do so.  WOUND CARE INSTRUCTIONS (if applicable):  Keep a dry clean dressing on the anesthesia/puncture wound site if there is drainage.  Once the wound has quit draining you may leave it open to air.  Generally you should leave the bandage intact for twenty four hours unless there is drainage.  If the epidural site drains for more than 36-48 hours please call the anesthesia department.  QUESTIONS?:  Please feel free to call your physician or the hospital operator if you have any questions, and they will be happy to assist you.

## 2014-07-20 ENCOUNTER — Encounter (HOSPITAL_COMMUNITY): Payer: Self-pay

## 2014-07-20 ENCOUNTER — Encounter (HOSPITAL_COMMUNITY)
Admission: RE | Admit: 2014-07-20 | Discharge: 2014-07-20 | Disposition: A | Discharge status: Home / Self Care | Payer: Medicare Other | Source: Ambulatory Visit | Attending: Ophthalmology | Admitting: Ophthalmology

## 2014-07-20 DIAGNOSIS — Z7982 Long term (current) use of aspirin: Secondary | ICD-10-CM | POA: Diagnosis not present

## 2014-07-20 DIAGNOSIS — H2511 Age-related nuclear cataract, right eye: Secondary | ICD-10-CM | POA: Diagnosis not present

## 2014-07-20 DIAGNOSIS — M1990 Unspecified osteoarthritis, unspecified site: Secondary | ICD-10-CM | POA: Diagnosis not present

## 2014-07-20 DIAGNOSIS — Z79899 Other long term (current) drug therapy: Secondary | ICD-10-CM | POA: Diagnosis not present

## 2014-07-20 DIAGNOSIS — I10 Essential (primary) hypertension: Secondary | ICD-10-CM | POA: Diagnosis not present

## 2014-07-20 DIAGNOSIS — K219 Gastro-esophageal reflux disease without esophagitis: Secondary | ICD-10-CM | POA: Diagnosis not present

## 2014-07-20 HISTORY — DX: Other complications of anesthesia, initial encounter: T88.59XA

## 2014-07-20 HISTORY — DX: Adverse effect of unspecified anesthetic, initial encounter: T41.45XA

## 2014-07-20 HISTORY — DX: Unspecified osteoarthritis, unspecified site: M19.90

## 2014-07-20 LAB — BASIC METABOLIC PANEL WITH GFR
Anion gap: 11 (ref 5–15)
BUN: 11 mg/dL (ref 6–23)
CO2: 28 meq/L (ref 19–32)
Calcium: 9.5 mg/dL (ref 8.4–10.5)
Chloride: 103 meq/L (ref 96–112)
Creatinine, Ser: 0.9 mg/dL (ref 0.50–1.10)
GFR calc Af Amer: 73 mL/min — ABNORMAL LOW
GFR calc non Af Amer: 63 mL/min — ABNORMAL LOW
Glucose, Bld: 103 mg/dL — ABNORMAL HIGH (ref 70–99)
Potassium: 4.2 meq/L (ref 3.7–5.3)
Sodium: 142 meq/L (ref 137–147)

## 2014-07-20 LAB — HEMOGLOBIN AND HEMATOCRIT, BLOOD
HCT: 38.5 % (ref 36.0–46.0)
Hemoglobin: 13.6 g/dL (ref 12.0–15.0)

## 2014-07-21 ENCOUNTER — Telehealth: Payer: Self-pay | Admitting: Family

## 2014-07-22 MED ORDER — PHENYLEPHRINE HCL 2.5 % OP SOLN
OPHTHALMIC | Status: AC
  Filled 2014-07-22: qty 15

## 2014-07-22 MED ORDER — LIDOCAINE HCL (PF) 1 % IJ SOLN
INTRAMUSCULAR | Status: AC
  Filled 2014-07-22: qty 2

## 2014-07-22 MED ORDER — CYCLOPENTOLATE-PHENYLEPHRINE OP SOLN OPTIME - NO CHARGE
OPHTHALMIC | Status: AC
  Filled 2014-07-22: qty 2

## 2014-07-22 MED ORDER — LIDOCAINE HCL 3.5 % OP GEL
OPHTHALMIC | Status: AC
  Filled 2014-07-22: qty 1

## 2014-07-22 MED ORDER — NEOMYCIN-POLYMYXIN-DEXAMETH 3.5-10000-0.1 OP SUSP
OPHTHALMIC | Status: AC
  Filled 2014-07-22: qty 5

## 2014-07-22 MED ORDER — TETRACAINE HCL 0.5 % OP SOLN
OPHTHALMIC | Status: AC
  Filled 2014-07-22: qty 2

## 2014-07-23 ENCOUNTER — Encounter (HOSPITAL_COMMUNITY)
Admission: RE | Disposition: A | Discharge status: Home / Self Care | Payer: Self-pay | Source: Ambulatory Visit | Attending: Ophthalmology

## 2014-07-23 ENCOUNTER — Encounter (HOSPITAL_COMMUNITY): Payer: Medicare Other | Admitting: Anesthesiology

## 2014-07-23 ENCOUNTER — Ambulatory Visit (HOSPITAL_COMMUNITY)
Admission: RE | Admit: 2014-07-23 | Discharge: 2014-07-23 | Disposition: A | Discharge status: Home / Self Care | Payer: Medicare Other | Source: Ambulatory Visit | Attending: Ophthalmology | Admitting: Ophthalmology

## 2014-07-23 ENCOUNTER — Encounter (HOSPITAL_COMMUNITY): Payer: Self-pay

## 2014-07-23 ENCOUNTER — Ambulatory Visit (HOSPITAL_COMMUNITY): Payer: Medicare Other | Admitting: Anesthesiology

## 2014-07-23 DIAGNOSIS — K219 Gastro-esophageal reflux disease without esophagitis: Secondary | ICD-10-CM | POA: Insufficient documentation

## 2014-07-23 DIAGNOSIS — I10 Essential (primary) hypertension: Secondary | ICD-10-CM | POA: Insufficient documentation

## 2014-07-23 DIAGNOSIS — H2511 Age-related nuclear cataract, right eye: Secondary | ICD-10-CM | POA: Insufficient documentation

## 2014-07-23 DIAGNOSIS — M1990 Unspecified osteoarthritis, unspecified site: Secondary | ICD-10-CM | POA: Insufficient documentation

## 2014-07-23 DIAGNOSIS — Z7982 Long term (current) use of aspirin: Secondary | ICD-10-CM | POA: Insufficient documentation

## 2014-07-23 DIAGNOSIS — Z79899 Other long term (current) drug therapy: Secondary | ICD-10-CM | POA: Insufficient documentation

## 2014-07-23 HISTORY — PX: CATARACT EXTRACTION W/PHACO: SHX586

## 2014-07-23 SURGERY — PHACOEMULSIFICATION, CATARACT, WITH IOL INSERTION
Anesthesia: Monitor Anesthesia Care | Site: Eye | Laterality: Right

## 2014-07-23 MED ORDER — NEOMYCIN-POLYMYXIN-DEXAMETH 3.5-10000-0.1 OP SUSP
OPHTHALMIC | Status: DC | PRN
  Administered 2014-07-23: 2 [drp] via OPHTHALMIC

## 2014-07-23 MED ORDER — MIDAZOLAM HCL 2 MG/2ML IJ SOLN
1.0000 mg | INTRAMUSCULAR | Status: DC | PRN
  Administered 2014-07-23: 2 mg via INTRAVENOUS

## 2014-07-23 MED ORDER — MIDAZOLAM HCL 2 MG/2ML IJ SOLN
INTRAMUSCULAR | Status: AC
  Filled 2014-07-23: qty 2

## 2014-07-23 MED ORDER — NEOMYCIN-POLYMYXIN-DEXAMETH 3.5-10000-0.1 OP SUSP
OPHTHALMIC | Status: AC
  Filled 2014-07-23: qty 5

## 2014-07-23 MED ORDER — PHENYLEPHRINE HCL 2.5 % OP SOLN
1.0000 [drp] | OPHTHALMIC | Status: AC
  Administered 2014-07-23 (×3): 1 [drp] via OPHTHALMIC

## 2014-07-23 MED ORDER — CYCLOPENTOLATE-PHENYLEPHRINE 0.2-1 % OP SOLN
1.0000 [drp] | OPHTHALMIC | Status: AC
  Administered 2014-07-23 (×3): 1 [drp] via OPHTHALMIC

## 2014-07-23 MED ORDER — EPINEPHRINE HCL 1 MG/ML IJ SOLN
INTRAMUSCULAR | Status: AC
  Filled 2014-07-23: qty 1

## 2014-07-23 MED ORDER — LIDOCAINE HCL (PF) 1 % IJ SOLN
INTRAMUSCULAR | Status: DC | PRN
  Administered 2014-07-23: .5 mL

## 2014-07-23 MED ORDER — PROVISC 10 MG/ML IO SOLN
INTRAOCULAR | Status: DC | PRN
  Administered 2014-07-23: 0.85 mL via INTRAOCULAR

## 2014-07-23 MED ORDER — FENTANYL CITRATE 0.05 MG/ML IJ SOLN
25.0000 ug | INTRAMUSCULAR | Status: AC
  Administered 2014-07-23 (×2): 25 ug via INTRAVENOUS

## 2014-07-23 MED ORDER — LIDOCAINE 3.5 % OP GEL OPTIME - NO CHARGE
OPHTHALMIC | Status: DC | PRN
  Administered 2014-07-23: 1 [drp] via OPHTHALMIC

## 2014-07-23 MED ORDER — LACTATED RINGERS IV SOLN
INTRAVENOUS | Status: DC
  Administered 2014-07-23: 09:00:00 via INTRAVENOUS

## 2014-07-23 MED ORDER — LIDOCAINE HCL 3.5 % OP GEL
1.0000 "application " | Freq: Once | OPHTHALMIC | Status: AC
  Administered 2014-07-23: 1 via OPHTHALMIC

## 2014-07-23 MED ORDER — EPINEPHRINE HCL 1 MG/ML IJ SOLN
INTRAOCULAR | Status: DC | PRN
  Administered 2014-07-23: 10:00:00

## 2014-07-23 MED ORDER — FENTANYL CITRATE 0.05 MG/ML IJ SOLN
INTRAMUSCULAR | Status: AC
  Filled 2014-07-23: qty 2

## 2014-07-23 MED ORDER — BSS IO SOLN
INTRAOCULAR | Status: DC | PRN
  Administered 2014-07-23: 15 mL

## 2014-07-23 MED ORDER — TETRACAINE HCL 0.5 % OP SOLN
1.0000 [drp] | OPHTHALMIC | Status: AC
  Administered 2014-07-23 (×3): 1 [drp] via OPHTHALMIC

## 2014-07-23 MED ORDER — POVIDONE-IODINE 5 % OP SOLN
OPHTHALMIC | Status: DC | PRN
  Administered 2014-07-23: 1 via OPHTHALMIC

## 2014-07-23 SURGICAL SUPPLY — 11 items
CLOTH BEACON ORANGE TIMEOUT ST (SAFETY) ×2 IMPLANT
EYE SHIELD UNIVERSAL CLEAR (GAUZE/BANDAGES/DRESSINGS) ×2 IMPLANT
GLOVE BIOGEL PI IND STRL 6.5 (GLOVE) IMPLANT
GLOVE BIOGEL PI INDICATOR 6.5 (GLOVE) ×2
GLOVE EXAM NITRILE MD LF STRL (GLOVE) ×2 IMPLANT
PAD ARMBOARD 7.5X6 YLW CONV (MISCELLANEOUS) ×2 IMPLANT
SIGHTPATH CAT PROC W REG LENS (Ophthalmic Related) ×3 IMPLANT
SYRINGE LUER LOK 1CC (MISCELLANEOUS) ×2 IMPLANT
TAPE SURG TRANSPORE 1 IN (GAUZE/BANDAGES/DRESSINGS) ×1 IMPLANT
TAPE SURGICAL TRANSPORE 1 IN (GAUZE/BANDAGES/DRESSINGS) ×2
WATER STERILE IRR 250ML POUR (IV SOLUTION) ×2 IMPLANT

## 2014-07-23 NOTE — Anesthesia Postprocedure Evaluation (Signed)
  Anesthesia Post-op Note  Patient: Deborah Pruitt  Procedure(s) Performed: Procedure(s) (LRB): CATARACT EXTRACTION PHACO AND INTRAOCULAR LENS PLACEMENT RIGHT EYE CDE=6.24 (Right)  Patient Location:  Short Stay  Anesthesia Type: MAC  Level of Consciousness: awake  Airway and Oxygen Therapy: Patient Spontanous Breathing  Post-op Pain: none  Post-op Assessment: Post-op Vital signs reviewed, Patient's Cardiovascular Status Stable, Respiratory Function Stable, Patent Airway, No signs of Nausea or vomiting and Pain level controlled  Post-op Vital Signs: Reviewed and stable  Complications: No apparent anesthesia complications

## 2014-07-23 NOTE — Anesthesia Procedure Notes (Signed)
Procedure Name: MAC Date/Time: 07/23/2014 10:20 AM Performed by: Franco NonesYATES, Montanna Mcbain S Pre-anesthesia Checklist: Patient identified, Emergency Drugs available, Suction available, Timeout performed and Patient being monitored Patient Re-evaluated:Patient Re-evaluated prior to inductionOxygen Delivery Method: Nasal Cannula

## 2014-07-23 NOTE — Transfer of Care (Signed)
Immediate Anesthesia Transfer of Care Note  Patient: Deborah Pruitt  Procedure(s) Performed: Procedure(s) (LRB): CATARACT EXTRACTION PHACO AND INTRAOCULAR LENS PLACEMENT RIGHT EYE CDE=6.24 (Right)  Patient Location: Shortstay  Anesthesia Type: MAC  Level of Consciousness: awake  Airway & Oxygen Therapy: Patient Spontanous Breathing   Post-op Assessment: Report given to PACU RN, Post -op Vital signs reviewed and stable and Patient moving all extremities  Post vital signs: Reviewed and stable  Complications: No apparent anesthesia complications

## 2014-07-23 NOTE — H&P (Signed)
I have reviewed the H&P, the patient was re-examined, and I have identified no interval changes in medical condition and plan of care since the history and physical of record  

## 2014-07-23 NOTE — Anesthesia Preprocedure Evaluation (Signed)
Anesthesia Evaluation  Patient identified by MRN, date of birth, ID band Patient awake    Reviewed: Allergy & Precautions, H&P , NPO status , Patient's Chart, lab work & pertinent test results  Airway Mallampati: II  TM Distance: >3 FB     Dental  (+) Teeth Intact   Pulmonary neg pulmonary ROS,  breath sounds clear to auscultation        Cardiovascular hypertension, Pt. on medications Rhythm:Regular Rate:Normal     Neuro/Psych    GI/Hepatic GERD-  Medicated,  Endo/Other    Renal/GU      Musculoskeletal  (+) Arthritis -,   Abdominal   Peds  Hematology   Anesthesia Other Findings   Reproductive/Obstetrics                             Anesthesia Physical Anesthesia Plan  ASA: II  Anesthesia Plan: MAC   Post-op Pain Management:    Induction: Intravenous  Airway Management Planned: Nasal Cannula  Additional Equipment:   Intra-op Plan:   Post-operative Plan:   Informed Consent: I have reviewed the patients History and Physical, chart, labs and discussed the procedure including the risks, benefits and alternatives for the proposed anesthesia with the patient or authorized representative who has indicated his/her understanding and acceptance.     Plan Discussed with:   Anesthesia Plan Comments:         Anesthesia Quick Evaluation

## 2014-07-23 NOTE — Op Note (Signed)
Date of Admission: 07/23/2014  Date of Surgery: 07/23/2014   Pre-Op Dx: Cataract Right Eye  Post-Op Dx: Senile Nuclear Cataract Right  Eye,  Dx Code H25.11  Surgeon: Gemma PayorKerry Usama Harkless, M.D.  Assistants: None  Anesthesia: Topical with MAC  Indications: Painless, progressive loss of vision with compromise of daily activities.  Surgery: Cataract Extraction with Intraocular lens Implant Right Eye  Discription: The patient had dilating drops and viscous lidocaine placed into the Right eye in the pre-op holding area. After transfer to the operating room, a time out was performed. The patient was then prepped and draped. Beginning with a 75 degree blade a paracentesis port was made at the surgeon's 2 o'clock position. The anterior chamber was then filled with 1% non-preserved lidocaine. This was followed by filling the anterior chamber with Provisc.  A 2.854mm keratome blade was used to make a clear corneal incision at the temporal limbus.  A bent cystatome needle was used to create a continuous tear capsulotomy. Hydrodissection was performed with balanced salt solution on a Fine canula. The lens nucleus was then removed using the phacoemulsification handpiece. Residual cortex was removed with the I&A handpiece. The anterior chamber and capsular bag were refilled with Provisc. A posterior chamber intraocular lens was placed into the capsular bag with it's injector. The implant was positioned with the Kuglan hook. The Provisc was then removed from the anterior chamber and capsular bag with the I&A handpiece. Stromal hydration of the main incision and paracentesis port was performed with BSS on a Fine canula. The wounds were tested for leak which was negative. The patient tolerated the procedure well. There were no operative complications. The patient was then transferred to the recovery room in stable condition.  Complications: None  Specimen: None  EBL: None  Prosthetic device: Hoya iSert 250, power 11.0  D, SN [id number].

## 2014-07-23 NOTE — Discharge Instructions (Signed)

## 2014-07-24 ENCOUNTER — Encounter (HOSPITAL_COMMUNITY): Payer: Self-pay | Admitting: Ophthalmology

## 2014-07-27 ENCOUNTER — Encounter: Payer: Self-pay | Admitting: Family

## 2014-07-27 NOTE — Telephone Encounter (Signed)
Your last kidney results were reviewed and provider noted that they were stable. Stable implies that the number results are continuing to be close to prior results. This does imply that kidney functions are not normal.  Please call to discuss with us.   Patient and I spoke on phone and discussed her concerns.

## 2014-07-28 ENCOUNTER — Encounter: Payer: Self-pay | Admitting: Internal Medicine

## 2014-07-28 ENCOUNTER — Ambulatory Visit (INDEPENDENT_AMBULATORY_CARE_PROVIDER_SITE_OTHER): Payer: Medicare Other | Admitting: Internal Medicine

## 2014-07-28 VITALS — BP 154/76 | HR 64 | Ht 59.5 in | Wt 157.5 lb

## 2014-07-28 DIAGNOSIS — Z1211 Encounter for screening for malignant neoplasm of colon: Secondary | ICD-10-CM

## 2014-07-28 DIAGNOSIS — K219 Gastro-esophageal reflux disease without esophagitis: Secondary | ICD-10-CM

## 2014-07-28 DIAGNOSIS — R1314 Dysphagia, pharyngoesophageal phase: Secondary | ICD-10-CM

## 2014-07-28 NOTE — Progress Notes (Signed)
   Subjective:    Patient ID: Deborah Pruitt, female    DOB: 01/10/1941, 73 y.o.   MRN: [id number]  HPI This is a very nice lady here to discuss reflux and dysphagia. She has had heartburn and indigestion problems for many years since the late 1980s. She also had dysphagia problems which improved but never really resolved. She still has intermittent solid food dysphagia not nearly as frequently as she used to. There is no associated weight loss etc. She has never had an upper endoscopy. She has also never had a screening colonoscopy.  Medications, allergies, past medical history, past surgical history, family history and social history are reviewed and updated in the EMR.  Review of Systems Positive for occasional pedal edema and decreased vision. All other review of systems negative.    Objective:   Physical Exam General:  Well-developed, well-nourished and in no acute distress Eyes:  anicteric. Neck:   supple w/o thyromegaly or mass.  Lungs: Clear to auscultation bilaterally. Heart:  S1S2, no rubs, murmurs, gallops. Abdomen:  soft, non-tender, no hepatosplenomegaly, hernia, or mass and BS+.  Rectal:  deferred Lymph:  no cervical or supraclavicular adenopathy. Extremities:   no edema Skin   no rash. Neuro:  A&O x 3.  Psych:  appropriate mood and  Affect.   Data Reviewed:  Labs, primary care notes in the EMR      Assessment & Plan:   1. Dysphagia, pharyngoesophageal phase   2. Gastroesophageal reflux disease, esophagitis presence not specified   3. Colon cancer screening    She needs to have an upper GI endoscopy to evaluate the dysphagia and chronic reflux. Plan for possible esophageal dilation. The risks and benefits as well as alternatives of endoscopic procedure(s) have been discussed and reviewed. All questions answered. The patient agrees to proceed.  We discussed the various options for colon cancer screening and have decided to perform a screening colonoscopy.The  risks and benefits as well as alternatives of endoscopic procedure(s) have been discussed and reviewed. All questions answered. The patient agrees to proceed.

## 2014-07-28 NOTE — Patient Instructions (Signed)
You have been scheduled for an endoscopy and colonoscopy. Please follow the written instructions given to you at your visit today. Please pick up your prep supplies at the pharmacy. If you use inhalers (even only as needed), please bring them with you on the day of your procedure. Your physician has requested that you go to www.[email or web address] and enter the access code given to you at your visit today. This web site gives a general overview about your procedure. However, you should still follow specific instructions given to you by our office regarding your preparation for the procedure.   I appreciate the opportunity to care for you.  

## 2014-07-30 NOTE — Telephone Encounter (Signed)
Pt aware of appointment date/time 

## 2014-08-04 ENCOUNTER — Encounter: Payer: Self-pay | Admitting: Internal Medicine

## 2014-08-11 ENCOUNTER — Encounter (HOSPITAL_COMMUNITY): Payer: Self-pay | Admitting: *Deleted

## 2014-08-11 ENCOUNTER — Encounter (HOSPITAL_COMMUNITY): Admission: RE | Admit: 2014-08-11 | Payer: Medicare Other | Source: Ambulatory Visit

## 2014-08-12 ENCOUNTER — Other Ambulatory Visit: Payer: Self-pay

## 2014-08-12 ENCOUNTER — Ambulatory Visit: Payer: Self-pay

## 2014-08-14 MED ORDER — LIDOCAINE HCL 3.5 % OP GEL
OPHTHALMIC | Status: AC
  Filled 2014-08-14: qty 1

## 2014-08-14 MED ORDER — CYCLOPENTOLATE-PHENYLEPHRINE OP SOLN OPTIME - NO CHARGE
OPHTHALMIC | Status: AC
  Filled 2014-08-14: qty 2

## 2014-08-14 MED ORDER — NEOMYCIN-POLYMYXIN-DEXAMETH 3.5-10000-0.1 OP SUSP
OPHTHALMIC | Status: AC
  Filled 2014-08-14: qty 5

## 2014-08-14 MED ORDER — TETRACAINE HCL 0.5 % OP SOLN
OPHTHALMIC | Status: AC
  Filled 2014-08-14: qty 2

## 2014-08-14 MED ORDER — PHENYLEPHRINE HCL 2.5 % OP SOLN
OPHTHALMIC | Status: AC
  Filled 2014-08-14: qty 15

## 2014-08-14 MED ORDER — LIDOCAINE HCL (PF) 1 % IJ SOLN
INTRAMUSCULAR | Status: AC
  Filled 2014-08-14: qty 2

## 2014-08-17 ENCOUNTER — Encounter (HOSPITAL_COMMUNITY): Payer: Self-pay | Admitting: *Deleted

## 2014-08-17 ENCOUNTER — Encounter (HOSPITAL_COMMUNITY)
Admission: RE | Disposition: A | Discharge status: Home / Self Care | Payer: Self-pay | Source: Ambulatory Visit | Attending: Ophthalmology

## 2014-08-17 ENCOUNTER — Ambulatory Visit (HOSPITAL_COMMUNITY): Payer: Medicare Other | Admitting: Anesthesiology

## 2014-08-17 ENCOUNTER — Ambulatory Visit (HOSPITAL_COMMUNITY)
Admission: RE | Admit: 2014-08-17 | Discharge: 2014-08-17 | Disposition: A | Discharge status: Home / Self Care | Payer: Medicare Other | Source: Ambulatory Visit | Attending: Ophthalmology | Admitting: Ophthalmology

## 2014-08-17 DIAGNOSIS — K219 Gastro-esophageal reflux disease without esophagitis: Secondary | ICD-10-CM | POA: Diagnosis not present

## 2014-08-17 DIAGNOSIS — H269 Unspecified cataract: Secondary | ICD-10-CM | POA: Diagnosis present

## 2014-08-17 DIAGNOSIS — M1990 Unspecified osteoarthritis, unspecified site: Secondary | ICD-10-CM | POA: Diagnosis not present

## 2014-08-17 DIAGNOSIS — H2512 Age-related nuclear cataract, left eye: Secondary | ICD-10-CM | POA: Diagnosis not present

## 2014-08-17 DIAGNOSIS — I10 Essential (primary) hypertension: Secondary | ICD-10-CM | POA: Diagnosis not present

## 2014-08-17 HISTORY — PX: CATARACT EXTRACTION W/PHACO: SHX586

## 2014-08-17 SURGERY — PHACOEMULSIFICATION, CATARACT, WITH IOL INSERTION
Anesthesia: Monitor Anesthesia Care | Site: Eye | Laterality: Left

## 2014-08-17 MED ORDER — PROVISC 10 MG/ML IO SOLN
INTRAOCULAR | Status: DC | PRN
  Administered 2014-08-17: 0.85 mL via INTRAOCULAR

## 2014-08-17 MED ORDER — MIDAZOLAM HCL 2 MG/2ML IJ SOLN
1.0000 mg | INTRAMUSCULAR | Status: DC | PRN
  Administered 2014-08-17: 2 mg via INTRAVENOUS
  Filled 2014-08-17: qty 2

## 2014-08-17 MED ORDER — BSS IO SOLN
INTRAOCULAR | Status: DC | PRN
  Administered 2014-08-17: 15 mL via INTRAOCULAR

## 2014-08-17 MED ORDER — PHENYLEPHRINE HCL 2.5 % OP SOLN
1.0000 [drp] | OPHTHALMIC | Status: AC
  Administered 2014-08-17 (×3): 1 [drp] via OPHTHALMIC

## 2014-08-17 MED ORDER — EPINEPHRINE HCL 1 MG/ML IJ SOLN
INTRAOCULAR | Status: DC | PRN
  Administered 2014-08-17: 12:00:00

## 2014-08-17 MED ORDER — POVIDONE-IODINE 5 % OP SOLN
OPHTHALMIC | Status: DC | PRN
  Administered 2014-08-17: 1 via OPHTHALMIC

## 2014-08-17 MED ORDER — LACTATED RINGERS IV SOLN
INTRAVENOUS | Status: DC
  Administered 2014-08-17: 1000 mL via INTRAVENOUS

## 2014-08-17 MED ORDER — LIDOCAINE HCL (PF) 1 % IJ SOLN
INTRAMUSCULAR | Status: DC | PRN
  Administered 2014-08-17: .4 mL

## 2014-08-17 MED ORDER — TETRACAINE HCL 0.5 % OP SOLN
1.0000 [drp] | OPHTHALMIC | Status: AC
  Administered 2014-08-17 (×3): 1 [drp] via OPHTHALMIC

## 2014-08-17 MED ORDER — CYCLOPENTOLATE-PHENYLEPHRINE 0.2-1 % OP SOLN
1.0000 [drp] | OPHTHALMIC | Status: AC
  Administered 2014-08-17 (×3): 1 [drp] via OPHTHALMIC

## 2014-08-17 MED ORDER — FENTANYL CITRATE 0.05 MG/ML IJ SOLN
25.0000 ug | INTRAMUSCULAR | Status: AC
  Administered 2014-08-17 (×2): 25 ug via INTRAVENOUS
  Filled 2014-08-17: qty 2

## 2014-08-17 MED ORDER — LIDOCAINE HCL 3.5 % OP GEL
1.0000 "application " | Freq: Once | OPHTHALMIC | Status: AC
  Administered 2014-08-17: 1 via OPHTHALMIC

## 2014-08-17 MED ORDER — EPINEPHRINE HCL 1 MG/ML IJ SOLN
INTRAMUSCULAR | Status: AC
  Filled 2014-08-17: qty 1

## 2014-08-17 MED ORDER — NEOMYCIN-POLYMYXIN-DEXAMETH 3.5-10000-0.1 OP SUSP
OPHTHALMIC | Status: DC | PRN
  Administered 2014-08-17: 1 [drp] via OPHTHALMIC

## 2014-08-17 SURGICAL SUPPLY — 34 items
CAPSULAR TENSION RING-AMO (OPHTHALMIC RELATED) IMPLANT
CLOTH BEACON ORANGE TIMEOUT ST (SAFETY) ×3 IMPLANT
EYE SHIELD UNIVERSAL CLEAR (GAUZE/BANDAGES/DRESSINGS) ×2 IMPLANT
GLOVE BIO SURGEON STRL SZ 6.5 (GLOVE) IMPLANT
GLOVE BIO SURGEONS STRL SZ 6.5 (GLOVE)
GLOVE BIOGEL PI IND STRL 6.5 (GLOVE) IMPLANT
GLOVE BIOGEL PI IND STRL 7.0 (GLOVE) IMPLANT
GLOVE BIOGEL PI IND STRL 7.5 (GLOVE) IMPLANT
GLOVE BIOGEL PI INDICATOR 6.5 (GLOVE)
GLOVE BIOGEL PI INDICATOR 7.0 (GLOVE) ×4
GLOVE BIOGEL PI INDICATOR 7.5 (GLOVE)
GLOVE ECLIPSE 6.5 STRL STRAW (GLOVE) IMPLANT
GLOVE ECLIPSE 7.0 STRL STRAW (GLOVE) IMPLANT
GLOVE ECLIPSE 7.5 STRL STRAW (GLOVE) IMPLANT
GLOVE EXAM NITRILE LRG STRL (GLOVE) IMPLANT
GLOVE EXAM NITRILE MD LF STRL (GLOVE) IMPLANT
GLOVE SKINSENSE NS SZ6.5 (GLOVE)
GLOVE SKINSENSE NS SZ7.0 (GLOVE)
GLOVE SKINSENSE STRL SZ6.5 (GLOVE) IMPLANT
GLOVE SKINSENSE STRL SZ7.0 (GLOVE) IMPLANT
KIT VITRECTOMY (OPHTHALMIC RELATED) IMPLANT
PAD ARMBOARD 7.5X6 YLW CONV (MISCELLANEOUS) ×2 IMPLANT
PROC W NO LENS (INTRAOCULAR LENS)
PROC W SPEC LENS (INTRAOCULAR LENS)
PROCESS W NO LENS (INTRAOCULAR LENS) IMPLANT
PROCESS W SPEC LENS (INTRAOCULAR LENS) IMPLANT
RETRACTOR IRIS SIGHTPATH (OPHTHALMIC RELATED) IMPLANT
RING MALYGIN (MISCELLANEOUS) IMPLANT
SIGHTPATH CAT PROC W REG LENS (Ophthalmic Related) ×3 IMPLANT
SYRINGE LUER LOK 1CC (MISCELLANEOUS) ×3 IMPLANT
TAPE SURG TRANSPARENT 2IN (GAUZE/BANDAGES/DRESSINGS) IMPLANT
TAPE TRANSPARENT 2IN (GAUZE/BANDAGES/DRESSINGS) ×2
VISCOELASTIC ADDITIONAL (OPHTHALMIC RELATED) IMPLANT
WATER STERILE IRR 250ML POUR (IV SOLUTION) ×2 IMPLANT

## 2014-08-17 NOTE — Anesthesia Procedure Notes (Signed)
Procedure Name: MAC Date/Time: 08/17/2014 11:22 AM Performed by: Franco NonesYATES, Aksel Bencomo S Pre-anesthesia Checklist: Patient identified, Emergency Drugs available, Suction available, Timeout performed and Patient being monitored Patient Re-evaluated:Patient Re-evaluated prior to inductionOxygen Delivery Method: Nasal Cannula

## 2014-08-17 NOTE — H&P (Signed)
I have reviewed the H&P, the patient was re-examined, and I have identified no interval changes in medical condition and plan of care since the history and physical of record  

## 2014-08-17 NOTE — Op Note (Signed)
Date of Admission: 08/17/2014  Date of Surgery: 08/17/2014   Pre-Op Dx: Cataract Left Eye  Post-Op Dx: Senile Nuclear Cataract Left  Eye,  Dx Code H25.12  Surgeon: Gemma PayorKerry Adjoa Althouse, M.D.  Assistants: None  Anesthesia: Topical with MAC  Indications: Painless, progressive loss of vision with compromise of daily activities.  Surgery: Cataract Extraction with Intraocular lens Implant Left Eye  Discription: The patient had dilating drops and viscous lidocaine placed into the Left eye in the pre-op holding area. After transfer to the operating room, a time out was performed. The patient was then prepped and draped. Beginning with a 75 degree blade a paracentesis port was made at the surgeon's 2 o'clock position. The anterior chamber was then filled with 1% non-preserved lidocaine. This was followed by filling the anterior chamber with Provisc.  A 2.304mm keratome blade was used to make a clear corneal incision at the temporal limbus.  A bent cystatome needle was used to create a continuous tear capsulotomy. Hydrodissection was performed with balanced salt solution on a Fine canula. The lens nucleus was then removed using the phacoemulsification handpiece. Residual cortex was removed with the I&A handpiece. The anterior chamber and capsular bag were refilled with Provisc. A posterior chamber intraocular lens was placed into the capsular bag with it's injector. It appeared the leading haptic tore the posterior capsule so the optic was fixed with reverse optic capture. The Provisc was then removed from the anterior chamber and capsular bag with the I&A handpiece. Stromal hydration of the main incision and paracentesis port was performed with BSS on a Fine canula. The wounds were tested for leak which was negative. The patient tolerated the procedure well. There were no operative complications. The patient was then transferred to the recovery room in stable condition.  Complications: None  Specimen: None  EBL:  None  Prosthetic device: Hoya iSert 250, power 16.5 D, SN [id number].

## 2014-08-17 NOTE — Anesthesia Preprocedure Evaluation (Signed)
Anesthesia Evaluation  Patient identified by MRN, date of birth, ID band Patient awake    Reviewed: Allergy & Precautions, H&P , NPO status , Patient's Chart, lab work & pertinent test results  Airway Mallampati: II  TM Distance: >3 FB     Dental  (+) Teeth Intact   Pulmonary neg pulmonary ROS,  breath sounds clear to auscultation        Cardiovascular hypertension, Pt. on medications Rhythm:Regular Rate:Normal     Neuro/Psych    GI/Hepatic GERD-  Medicated,  Endo/Other    Renal/GU      Musculoskeletal  (+) Arthritis -,   Abdominal   Peds  Hematology   Anesthesia Other Findings   Reproductive/Obstetrics                             Anesthesia Physical Anesthesia Plan  ASA: II  Anesthesia Plan: MAC   Post-op Pain Management:    Induction: Intravenous  Airway Management Planned: Nasal Cannula  Additional Equipment:   Intra-op Plan:   Post-operative Plan:   Informed Consent: I have reviewed the patients History and Physical, chart, labs and discussed the procedure including the risks, benefits and alternatives for the proposed anesthesia with the patient or authorized representative who has indicated his/her understanding and acceptance.     Plan Discussed with:   Anesthesia Plan Comments:         Anesthesia Quick Evaluation  

## 2014-08-17 NOTE — Discharge Instructions (Signed)

## 2014-08-17 NOTE — Transfer of Care (Signed)
Immediate Anesthesia Transfer of Care Note  Patient: Deborah Pruitt  Procedure(s) Performed: Procedure(s) (LRB): CATARACT EXTRACTION PHACO AND INTRAOCULAR LENS PLACEMENT LEFT EYE (Left)  Patient Location: Shortstay  Anesthesia Type: MAC  Level of Consciousness: awake  Airway & Oxygen Therapy: Patient Spontanous Breathing   Post-op Assessment: Report given to PACU RN, Post -op Vital signs reviewed and stable and Patient moving all extremities  Post vital signs: Reviewed and stable  Complications: No apparent anesthesia complications

## 2014-08-17 NOTE — Anesthesia Postprocedure Evaluation (Signed)
  Anesthesia Post-op Note  Patient: Deborah Pruitt  Procedure(s) Performed: Procedure(s) (LRB): CATARACT EXTRACTION PHACO AND INTRAOCULAR LENS PLACEMENT LEFT EYE (Left)  Patient Location:  Short Stay  Anesthesia Type: MAC  Level of Consciousness: awake  Airway and Oxygen Therapy: Patient Spontanous Breathing  Post-op Pain: none  Post-op Assessment: Post-op Vital signs reviewed, Patient's Cardiovascular Status Stable, Respiratory Function Stable, Patent Airway, No signs of Nausea or vomiting and Pain level controlled  Post-op Vital Signs: Reviewed and stable  Complications: No apparent anesthesia complications

## 2014-08-26 ENCOUNTER — Ambulatory Visit (INDEPENDENT_AMBULATORY_CARE_PROVIDER_SITE_OTHER): Payer: Medicare Other

## 2014-08-26 ENCOUNTER — Encounter: Payer: Self-pay | Admitting: Pharmacist

## 2014-08-26 ENCOUNTER — Ambulatory Visit (INDEPENDENT_AMBULATORY_CARE_PROVIDER_SITE_OTHER): Payer: Medicare Other | Admitting: Pharmacist

## 2014-08-26 VITALS — Ht 60.0 in | Wt 158.5 lb

## 2014-08-26 DIAGNOSIS — Z13820 Encounter for screening for osteoporosis: Secondary | ICD-10-CM

## 2014-08-26 DIAGNOSIS — M810 Age-related osteoporosis without current pathological fracture: Secondary | ICD-10-CM

## 2014-08-26 DIAGNOSIS — R7989 Other specified abnormal findings of blood chemistry: Secondary | ICD-10-CM | POA: Insufficient documentation

## 2014-08-26 NOTE — Patient Instructions (Signed)

## 2014-08-26 NOTE — Progress Notes (Signed)
Patient ID: Deborah Pruitt, female   DOB: 07/03/1941, 73 y.o.   MRN: [id number]  [facility] Current Height: Height: 5' (152.4 cm)      Max Lifetime Height:  5\' 1"  Current Weight: Weight: 158 lb 8 oz (71.895 kg)       Ethnicity:Caucasian    HPI: Patient reports that she has had DEXA at another facility but never here.  Per her report she has been diagnosed with "thin" bones but not sure if this was osteopenia or osteoporosis.  Back Pain?  No       Kyphosis?  No Prior fracture?  Yes - lef at 73 yo Med(s) for Osteoporosis/Osteopenia:  none Med(s) previously tried for Osteoporosis/Osteopenia:  none                                                             PMH: Age at menopause:  About 73 yo Hysterectomy?  No Oophorectomy?  No HRT? No Steroid Use?  No Thyroid med?  No History of cancer?  No History of digestive disorders (ie Crohn's)?  Yes - take daily PPI for GERD Current or previous eating disorders?  No Last Vitamin D Result:  55.4 (04/2014) Last GFR Result:  71 (04/2014)   FH/SH: Family history of osteoporosis?  No Parent with history of hip fracture?  No Family history of breast cancer?  No Exercise?  Yes - walking and stretching of hip.  Wednesday night exercise class at church. Smoking?  No Alcohol?  No    Calcium Assessment Calcium Intake  # of servings/day  Calcium mg  Milk (8 oz) 0   x  300  = 0  Yogurt (4 oz) 0 x  200 = 0  Cheese (1 oz) 1 x  200 = 200mg   Other Calcium sources   1250mg   Ca supplement MVI and calcium supplement = 1050mg    Estimated calcium intake per day 1500mg     DEXA Results Date of Test T-Score for AP Spine L1-L4 T-Score for Total Left Hip T-Score for Total Right Hip  08/26/2014 -0.5 -- -2.8                  Neck of right hip = -2.3  FRAX 10 year estimate: Total FX risk:  21%  (consider medication if >/= 20%) Hip FX risk:  4.8%  (consider medication if >/= 3%)  Assessment: osteoporosis  Recommendations: 1.   Discussed various medications to decrease fracture risk - recommended oral bisphosphonate to start.  Patient declined to start today - wanted to do more research.  Will follow up in 2 to 4 weeks 2.  continue calcium 1200mg  daily through supplementation or diet.  3.  recommend weight bearing exercise - 30 minutes at least 4 days per week.   4.  Counseled and educated about fall risk and prevention.  Recheck DEXA:  2 years  Time spent counseling patient:  30 minutes   Henrene Pastorammy Zealand Boyett, PharmD, CPP

## 2014-09-11 ENCOUNTER — Telehealth: Payer: Self-pay | Admitting: Pharmacist

## 2014-09-11 NOTE — Telephone Encounter (Signed)
Called patient to follow up treatment option for osteoporosis that were discussed at DEXA appt.  Patient states that she has not gotten time to look up options we discussed.  Asked that I follow up again at beginning of 2016.

## 2014-09-30 ENCOUNTER — Encounter: Payer: Self-pay | Admitting: Internal Medicine

## 2014-09-30 ENCOUNTER — Ambulatory Visit (AMBULATORY_SURGERY_CENTER): Payer: PPO | Admitting: Internal Medicine

## 2014-09-30 VITALS — BP 158/82 | HR 61 | Temp 96.9°F | Resp 14 | Ht 59.5 in | Wt 157.0 lb

## 2014-09-30 DIAGNOSIS — K222 Esophageal obstruction: Secondary | ICD-10-CM

## 2014-09-30 DIAGNOSIS — R1314 Dysphagia, pharyngoesophageal phase: Secondary | ICD-10-CM

## 2014-09-30 DIAGNOSIS — Z1211 Encounter for screening for malignant neoplasm of colon: Secondary | ICD-10-CM

## 2014-09-30 MED ORDER — SODIUM CHLORIDE 0.9 % IV SOLN: 500.0000 mL | INTRAVENOUS | Status: DC

## 2014-09-30 NOTE — Op Note (Signed)
[facility] [address] [place], [id number]   COLONOSCOPY PROCEDURE REPORT  PATIENT: Deborah Pruitt, Deborah Pruitt  MR#: [id number] BIRTHDATE: 05/25/1941 , 73  yrs. old GENDER: female ENDOSCOPIST: Iva Booparl E Gessner, MD, [facility] PROCEDURE DATE:  09/30/2014 PROCEDURE:   Colonoscopy, screening First Screening Colonoscopy - Avg.  risk and is 50 yrs.  old or older Yes.  Prior Negative Screening - Now for repeat screening. N/A  History of Adenoma - Now for follow-up colonoscopy & has been > or = to 3 yrs.  N/A  Polyps Removed Today? No.  Polyps Removed Today? No.  Recommend repeat exam, <10 yrs? Polyps Removed Today? No.  Recommend repeat exam, <10 yrs? No. ASA CLASS:   Class II INDICATIONS:first colonoscopy and average risk for colorectal cancer. MEDICATIONS: Residual sedation present, Monitored anesthesia care, and Propofol 100 mg IV  DESCRIPTION OF PROCEDURE:   After the risks benefits and alternatives of the procedure were thoroughly explained, informed consent was obtained.  The digital rectal exam revealed no abnormalities of the rectum.   The LB [id number]  endoscope was introduced through the anus and advanced to the cecum, which was identified by both the appendix and ileocecal valve. No adverse events experienced.   The quality of the prep was excellent, using MiraLax  The instrument was then slowly withdrawn as the colon was fully examined.      COLON FINDINGS: A normal appearing cecum, ileocecal valve, and appendiceal orifice were identified.  The ascending, transverse, descending, sigmoid colon, and rectum appeared unremarkable. Retroflexed right colon and rectal views revealed no abnormalities. The time to cecum=1 minutes 28 seconds.  Withdrawal time=9 minutes 37 seconds.  The scope was withdrawn and the procedure completed. COMPLICATIONS: There were no immediate complications.  ENDOSCOPIC IMPRESSION: Normal colonoscopy - excellent  prep  RECOMMENDATIONS: Follow-up as needed  eSigned:  Iva Booparl E Gessner, MD, [facility] 09/30/2014 8:44 AM   cc: Vernon Preyon Moore, MD and The Patient

## 2014-09-30 NOTE — Progress Notes (Signed)
Called to room to assist during endoscopic procedure.  Patient ID and intended procedure confirmed with present staff. Received instructions for my participation in the procedure from the performing physician.  

## 2014-09-30 NOTE — Patient Instructions (Addendum)
You have a stricture or narrow area where esophagus and stomach meet and I dilated it to help your swallowing. The colonoscopy was normal. You won't need another of those.  I appreciate the opportunity to care for you. Iva Booparl E. Gessner, MD, FACG     YOU HAD AN ENDOSCOPIC PROCEDURE TODAY AT THE [facility]: Refer to the procedure report that was given to you for any specific questions about what was found during the examination.  If the procedure report does not answer your questions, please call your gastroenterologist to clarify.  If you requested that your care partner not be given the details of your procedure findings, then the procedure report has been included in a sealed envelope for you to review at your convenience later.  YOU SHOULD EXPECT: Some feelings of bloating in the abdomen. Passage of more gas than usual.  Walking can help get rid of the air that was put into your GI tract during the procedure and reduce the bloating. If you had a lower endoscopy (such as a colonoscopy or flexible sigmoidoscopy) you may notice spotting of blood in your stool or on the toilet paper. If you underwent a bowel prep for your procedure, then you may not have a normal bowel movement for a few days.  DIET: FOLLOW DILATION HANDOUT.  ACTIVITY: Your care partner should take you home directly after the procedure.  You should plan to take it easy, moving slowly for the rest of the day.  You can resume normal activity the day after the procedure however you should NOT DRIVE or use heavy machinery for 24 hours (because of the sedation medicines used during the test).    SYMPTOMS TO REPORT IMMEDIATELY: A gastroenterologist can be reached at any hour.  During normal business hours, 8:30 AM to 5:00 PM Monday through Friday, call [phone].  After hours and on weekends, please call the GI answering service at [phone] who will take a message and have the physician on call contact  you.   Following lower endoscopy (colonoscopy or flexible sigmoidoscopy):  Excessive amounts of blood in the stool  Significant tenderness or worsening of abdominal pains  Swelling of the abdomen that is new, acute  Fever of 100F or higher  Following upper endoscopy (EGD)  Vomiting of blood or coffee ground material  New chest pain or pain under the shoulder blades  Painful or persistently difficult swallowing  New shortness of breath  Fever of 100F or higher  Black, tarry-looking stools  FOLLOW UP: If any biopsies were taken you will be contacted by phone or by letter within the next 1-3 weeks.  Call your gastroenterologist if you have not heard about the biopsies in 3 weeks.  Our staff will call the home number listed on your records the next business day following your procedure to check on you and address any questions or concerns that you may have at that time regarding the information given to you following your procedure. This is a courtesy call and so if there is no answer at the home number and we have not heard from you through the emergency physician on call, we will assume that you have returned to your regular daily activities without incident.  SIGNATURES/CONFIDENTIALITY: You and/or your care partner have signed paperwork which will be entered into your electronic medical record.  These signatures attest to the fact that that the information above on your After Visit Summary has been reviewed and is understood.  Full responsibility of the confidentiality of this discharge information lies with you and/or your care-partner.   Resume medications. Information given on dilation diet and hiatal hernia with discharge instructions.

## 2014-09-30 NOTE — Progress Notes (Signed)
Report to PACU, RN, vss, BBS= Clear.  

## 2014-09-30 NOTE — Op Note (Signed)
[facility] [address] [place], [id number]   ENDOSCOPY PROCEDURE REPORT  PATIENT: Deborah Pruitt, Deborah Pruitt  MR#: [id number] BIRTHDATE: 1941-03-26 , 73  yrs. old GENDER: female ENDOSCOPIST: Iva Booparl E Javiana Anwar, MD, [facility] PROCEDURE DATE:  09/30/2014 PROCEDURE:  EGD w/ balloon dilation ASA CLASS:     Class II INDICATIONS:  dysphagia. MEDICATIONS: Propofol 200 mg IV and Monitored anesthesia care TOPICAL ANESTHETIC: none  DESCRIPTION OF PROCEDURE: After the risks benefits and alternatives of the procedure were thoroughly explained, informed consent was obtained.  The LB [id number] endoscope was introduced through the mouth and advanced to the second portion of the duodenum , Without limitations.  The instrument was slowly withdrawn as the mucosa was fully examined.    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  Retroflexed views revealed as previously described.     The scope was then withdrawn from the patient and the procedure completed.  COMPLICATIONS: There were no immediate complications.  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  Resume prior diet tomorrow. Stay on PPI   eSigned:  Iva Booparl E Neeta Storey, MD, [facility] 09/30/2014 8:42 AM    CC:Don Christell ConstantMoore, MD and The Patient

## 2014-10-01 ENCOUNTER — Telehealth: Payer: Self-pay

## 2014-10-01 NOTE — Telephone Encounter (Signed)
  Follow up Call-  Call back number 09/30/2014  Post procedure Call Back phone  # [phone]  Permission to leave phone message Yes     Patient questions:  Do you have a fever, pain , or abdominal swelling? No. Pain Score  0 *  Have you tolerated food without any problems? Yes.    Have you been able to return to your normal activities? Yes.    Do you have any questions about your discharge instructions: Diet   No. Medications  No. Follow up visit  No.  Do you have questions or concerns about your Care? No.  Actions: * If pain score is 4 or above: No action needed, pain <4.  No problems per the pt. maw

## 2014-10-14 ENCOUNTER — Telehealth: Payer: Self-pay | Admitting: Pharmacist

## 2014-10-14 NOTE — Telephone Encounter (Signed)
-----   Message from Mon[place] Beachammy Monzerrat [place], [place] sent at 09/11/2014  3:38 PM EST ----- Call to follow up on decision for osteoporosis treatment

## 2014-10-14 NOTE — Telephone Encounter (Signed)
Patient did not answer - left message to call office to discuss medication options to treat osteopenia.

## 2014-10-30 ENCOUNTER — Telehealth: Payer: Self-pay | Admitting: Pharmacist

## 2014-10-30 NOTE — Telephone Encounter (Signed)
I spoke with patient again about treatment options for osteoporosis.  She again postpones treatment because she has not looked at options again since our discussion.  She reports that she has had cataracts surgery and has been frustrated with results.  She is afraid of making the wrong decision about her health.  I urged patient to review information that I gave her and to call me with any questions or concerns.  I will be glad to help her with treatment decisions and address concern. She agrees to review and call me back.

## 2015-03-22 ENCOUNTER — Other Ambulatory Visit: Payer: Self-pay

## 2015-04-27 ENCOUNTER — Other Ambulatory Visit: Payer: Self-pay | Admitting: Family

## 2015-04-30 ENCOUNTER — Telehealth: Payer: Self-pay | Admitting: Family Medicine

## 2015-04-30 MED ORDER — OMEGA-3-ACID ETHYL ESTERS 1 G PO CAPS: 2.0000 | ORAL_CAPSULE | Freq: Two times a day (BID) | ORAL | Status: DC

## 2015-04-30 NOTE — Telephone Encounter (Signed)
done

## 2015-05-12 ENCOUNTER — Encounter: Payer: Self-pay | Admitting: Family Medicine

## 2015-05-12 ENCOUNTER — Ambulatory Visit (INDEPENDENT_AMBULATORY_CARE_PROVIDER_SITE_OTHER): Payer: PPO | Admitting: Family Medicine

## 2015-05-12 VITALS — BP 147/83 | HR 71 | Temp 98.1°F | Ht 59.5 in | Wt 158.4 lb

## 2015-05-12 DIAGNOSIS — R7989 Other specified abnormal findings of blood chemistry: Secondary | ICD-10-CM

## 2015-05-12 DIAGNOSIS — I10 Essential (primary) hypertension: Secondary | ICD-10-CM | POA: Diagnosis not present

## 2015-05-12 DIAGNOSIS — E785 Hyperlipidemia, unspecified: Secondary | ICD-10-CM

## 2015-05-12 DIAGNOSIS — K219 Gastro-esophageal reflux disease without esophagitis: Secondary | ICD-10-CM | POA: Diagnosis not present

## 2015-05-12 MED ORDER — FAMOTIDINE 20 MG PO TABS: 20.0000 mg | ORAL_TABLET | Freq: Two times a day (BID) | ORAL | Status: DC

## 2015-05-12 MED ORDER — OMEGA-3-ACID ETHYL ESTERS 1 G PO CAPS: 2.0000 | ORAL_CAPSULE | Freq: Two times a day (BID) | ORAL | Status: DC

## 2015-05-12 NOTE — Assessment & Plan Note (Signed)
Good compliance meds Needs refill Labs

## 2015-05-12 NOTE — Assessment & Plan Note (Signed)
Taking vitamin D continue

## 2015-05-12 NOTE — Assessment & Plan Note (Addendum)
Elevated hear but good control at home Continue amlodipine She never took hydrochlorothiazide, discontinue officially Labs

## 2015-05-12 NOTE — Progress Notes (Signed)
Patient ID: Deborah Pruitt, female   DOB: 1941/08/14, 74 y.o.   MRN: [id number]   HPI  Patient presents today follow-up chronic medical problems  Hypertension Good compliance No dyspnea, palpitations, leg edema Checks blood pressure at home and average blood pressure is 120s to 130s systolic over 70s diastolic She never took hydrochlorothiazide regularly as she felt drained whenever she started taking it.  GERD Takes her current PPI about 2-3 times per week. She is worried about side effects of the medication she is hurting about in the knees. She has severe central chest burning pain with lying down about 3-4 times per year. She takes Pepcid occasionally feels that this helps her a lot also.  Hyperlipidemia Needs refill Tolerating meds easily  PMH: Smoking status noted ROS: Per HPI, otherwise negative  Objective: BP 147/83 mmHg  Pulse 71  Temp(Src) 98.1 F (36.7 C) (Oral)  Ht 4' 11.5" (1.511 m)  Wt 158 lb 6.4 oz (71.85 kg)  BMI 31.47 kg/m2 Gen: NAD, alert, cooperative with exam HEENT: NCAT, TMs celar WNL, Nares clear, oropharynx clear Neck: No thyromegaly, trachea midline, supple CV: RRR, good S1/S2, no murmur Resp: CTABL, no wheezes, non-labored Ext: No edema, warm Neuro: Alert and oriented, No gross deficits  Assessment and plan:  Low serum vitamin D Taking vitamin D continue   Hypertension Elevated hear but good control at home Continue amlodipine She never took hydrochlorothiazide, discontinue officially Labs  Hyperlipidemia with target LDL less than 100 Good compliance meds Needs refill Labs  GERD (gastroesophageal reflux disease) Symptoms rare occurring only 3-4 times per year Taking Protonix about 2-3 times per week, due to concerns about side effects Discontinue Protonix, take Pepcid instead when necessary    Orders Placed This Encounter  Procedures  . Vit D  25 hydroxy (rtn osteoporosis monitoring)    Meds ordered this encounter    Medications  . omega-3 acid ethyl esters (LOVAZA) 1 G capsule    Sig: Take 2 capsules (2 g total) by mouth 2 (two) times daily.    Dispense:  120 capsule    Refill:  11  . famotidine (PEPCID) 20 MG tablet    Sig: Take 1 tablet (20 mg total) by mouth 2 (two) times daily.    Dispense:  60 tablet    Refill:  11    Murtis Sink, MD [facility] 05/12/2015, 9:27 AM

## 2015-05-12 NOTE — Assessment & Plan Note (Signed)
Symptoms rare occurring only 3-4 times per year Taking Protonix about 2-3 times per week, due to concerns about side effects Discontinue Protonix, take Pepcid instead when necessary

## 2015-05-13 ENCOUNTER — Telehealth: Payer: Self-pay | Admitting: Family Medicine

## 2015-05-13 DIAGNOSIS — E785 Hyperlipidemia, unspecified: Secondary | ICD-10-CM

## 2015-05-13 LAB — CMP14+EGFR
ALT: 13 IU/L (ref 0–32)
AST: 15 IU/L (ref 0–40)
Albumin/Globulin Ratio: 1.7 (ref 1.1–2.5)
Albumin: 4.3 g/dL (ref 3.5–4.8)
Alkaline Phosphatase: 61 IU/L (ref 39–117)
BUN/Creatinine Ratio: 13 (ref 11–26)
BUN: 12 mg/dL (ref 8–27)
Bilirubin Total: 0.5 mg/dL (ref 0.0–1.2)
CO2: 25 mmol/L (ref 18–29)
Calcium: 9.3 mg/dL (ref 8.7–10.3)
Chloride: 103 mmol/L (ref 97–108)
Creatinine, Ser: 0.89 mg/dL (ref 0.57–1.00)
GFR, EST AFRICAN AMERICAN: 75 mL/min/{1.73_m2} (ref >59)
GFR, EST NON AFRICAN AMERICAN: 65 mL/min/{1.73_m2} (ref >59)
GLUCOSE: 109 mg/dL — AB (ref 65–99)
Globulin, Total: 2.5 g/dL (ref 1.5–4.5)
Potassium: 4.2 mmol/L (ref 3.5–5.2)
Sodium: 142 mmol/L (ref 134–144)
TOTAL PROTEIN: 6.8 g/dL (ref 6.0–8.5)

## 2015-05-13 LAB — LIPID PANEL
CHOL/HDL RATIO: 5.8 ratio — AB (ref 0.0–4.4)
Cholesterol, Total: 187 mg/dL (ref 100–199)
HDL: 32 mg/dL — AB (ref >39)
LDL Calculated: 128 mg/dL — ABNORMAL HIGH (ref 0–99)
Triglycerides: 135 mg/dL (ref 0–149)
VLDL CHOLESTEROL CAL: 27 mg/dL (ref 5–40)

## 2015-05-13 LAB — CBC WITH DIFFERENTIAL/PLATELET
BASOS ABS: 0 10*3/uL (ref 0.0–0.2)
BASOS: 1 %
EOS (ABSOLUTE): 0.2 10*3/uL (ref 0.0–0.4)
Eos: 4 %
Hematocrit: 39.9 % (ref 34.0–46.6)
Hemoglobin: 13.5 g/dL (ref 11.1–15.9)
IMMATURE GRANS (ABS): 0 10*3/uL (ref 0.0–0.1)
Immature Granulocytes: 0 %
LYMPHS: 39 %
Lymphocytes Absolute: 1.4 10*3/uL (ref 0.7–3.1)
MCH: 30.5 pg (ref 26.6–33.0)
MCHC: 33.8 g/dL (ref 31.5–35.7)
MCV: 90 fL (ref 79–97)
Monocytes Absolute: 0.3 10*3/uL (ref 0.1–0.9)
Monocytes: 9 %
NEUTROS PCT: 47 %
Neutrophils Absolute: 1.7 10*3/uL (ref 1.4–7.0)
PLATELETS: 243 10*3/uL (ref 150–379)
RBC: 4.42 x10E6/uL (ref 3.77–5.28)
RDW: 14.2 % (ref 12.3–15.4)
WBC: 3.7 10*3/uL (ref 3.4–10.8)

## 2015-05-13 LAB — VITAMIN D 25 HYDROXY (VIT D DEFICIENCY, FRACTURES): Vit D, 25-Hydroxy: 51.6 ng/mL (ref 30.0–100.0)

## 2015-05-13 MED ORDER — ICOSAPENT ETHYL 1 G PO CAPS: 2.0000 g | ORAL_CAPSULE | Freq: Two times a day (BID) | ORAL | Status: DC

## 2015-05-13 NOTE — Telephone Encounter (Signed)
Patient's triglycerides have improved daily all slightly worse. Could be because of DHA portion of lovaza which has been shown to actually elevated LDL. Will change toVascepa. Will ask nursing to notify.   It is ok for her to finish the Lovaza she has currently.   Murtis Sink, MD [facility] 05/13/2015, 8:06 AM

## 2015-05-13 NOTE — Telephone Encounter (Signed)
Pt notified of results Verbalizes understanding Also informed pt of new Rx for Vascepa

## 2015-05-28 ENCOUNTER — Encounter: Payer: Self-pay | Admitting: Family Medicine

## 2015-05-28 ENCOUNTER — Telehealth: Payer: Self-pay | Admitting: *Deleted

## 2015-05-28 NOTE — Telephone Encounter (Signed)
Ok to [employer] due to cost, continue Omega 3  Murtis Sink, MD [facility] 05/28/2015, 1:53 PM

## 2015-06-11 ENCOUNTER — Encounter: Payer: Self-pay | Admitting: Physician Assistant

## 2015-06-11 ENCOUNTER — Ambulatory Visit (INDEPENDENT_AMBULATORY_CARE_PROVIDER_SITE_OTHER): Payer: PPO | Admitting: Physician Assistant

## 2015-06-11 VITALS — BP 148/80 | HR 67 | Ht 59.5 in | Wt 156.0 lb

## 2015-06-11 DIAGNOSIS — I10 Essential (primary) hypertension: Secondary | ICD-10-CM | POA: Diagnosis not present

## 2015-06-11 DIAGNOSIS — Z8249 Family history of ischemic heart disease and other diseases of the circulatory system: Secondary | ICD-10-CM | POA: Diagnosis not present

## 2015-06-11 DIAGNOSIS — E785 Hyperlipidemia, unspecified: Secondary | ICD-10-CM

## 2015-06-11 DIAGNOSIS — G8929 Other chronic pain: Secondary | ICD-10-CM | POA: Diagnosis not present

## 2015-06-11 DIAGNOSIS — R079 Chest pain, unspecified: Secondary | ICD-10-CM | POA: Diagnosis not present

## 2015-06-11 NOTE — Progress Notes (Signed)
   Subjective:    Patient ID: Deborah Pruitt, female    DOB: Feb 11, 1941, 74 y.o.   MRN: [id number]  HPI 74 y/o female presents with c/o back pain, which has progressed to CP over the past 2 weeks. She feels that it may be related to her chair at work because it is very uncomfortable and causes her back pain. She has comorbid HTN and HLD. Has not seen a cardiologist in the past. Family h/o heart disease in parents, mother had CABG and brother CABG.   Episodes of CP starts between her shoulders, pain will ease with stretching or walking, however it progresses to her chest. Pain in chest is described as pressure and soreness. Denies arm pain, SOB, lightheaded or dizziness.Episodes have occurred  Over the past 24 hours. Intensity has not worsened but length of episodes have.  She was sitting at the computer when the episodes occurred.   No DOE, no h/o smoking     Review of Systems  Constitutional: Negative.   HENT: Negative.   Eyes: Negative.   Respiratory: Positive for chest tightness. Negative for shortness of breath.   Cardiovascular: Positive for chest pain (pressure ).  Gastrointestinal: Negative.   Endocrine: Negative.   Genitourinary: Negative.   Musculoskeletal: Positive for back pain.  Skin: Negative.   Allergic/Immunologic: Negative.   Neurological: Negative.   Psychiatric/Behavioral: Negative.        Objective:   Physical Exam  Constitutional: She is oriented to person, place, and time. She appears well-developed and well-nourished. No distress.  Cardiovascular:  EKG wnl   Pulmonary/Chest:  Pain is not reproducible with pressure to chest   Neurological: She is alert and oriented to person, place, and time.  Skin: She is not diaphoretic.  Psychiatric: She has a normal mood and affect. Her behavior is normal. Judgment and thought content normal.  Nursing note and vitals reviewed.         Assessment & Plan:  1. Chest pain, unspecified chest pain type  - EKG  12-Lead - Ambulatory referral to Cardiology  2. Recurrent chest pain  - Ambulatory referral to Cardiology  3. Family history of heart disease in female family member before age 18  - Ambulatory referral to Cardiology  4. Family history of heart disease in female family member before age 79  - Ambulatory referral to Cardiology  5. Essential hypertension  - Ambulatory referral to Cardiology  6. Hyperlipemia  - Ambulatory referral to Cardiology    I have advised patient to go to ED immediately if episodes of CP continue before appt with Cardiology  We discussed going to ED or Cardiology today but she would prefer to wait for appointment with Cardiology. I have discussed her RF's and strongly advised her to go to ED if an episode occurs over the weekend or prior to appt.   Tiffany A. Chauncey Reading PA-C

## 2015-06-11 NOTE — Progress Notes (Signed)
Patient evaluated through triage. VS stable. EKG WNL. Patient appeared well and in no acute distress.  She will return for appt at 12:10 with Lynwood Dawley, PA-C. Patient was agreeable to this.

## 2015-06-11 NOTE — Patient Instructions (Signed)
Chest Pain (Nonspecific) °It is often hard to give a diagnosis for the cause of chest pain. There is always a chance that your pain could be related to something serious, such as a heart attack or a blood clot in the lungs. You need to follow up with your doctor. °HOME CARE °· If antibiotic medicine was given, take it as directed by your doctor. Finish the medicine even if you start to feel better. °· For the next few days, avoid activities that bring on chest pain. Continue physical activities as told by your doctor. °· Do not use any tobacco products. This includes cigarettes, chewing tobacco, and e-cigarettes. °· Avoid drinking alcohol. °· Only take medicine as told by your doctor. °· Follow your doctor's suggestions for more testing if your chest pain does not go away. °· Keep all doctor visits you made. °GET HELP IF: °· Your chest pain does not go away, even after treatment. °· You have a rash with blisters on your chest. °· You have a fever. °GET HELP RIGHT AWAY IF:  °· You have more pain or pain that spreads to your arm, neck, jaw, back, or belly (abdomen). °· You have shortness of breath. °· You cough more than usual or cough up blood. °· You have very bad back or belly pain. °· You feel sick to your stomach (nauseous) or throw up (vomit). °· You have very bad weakness. °· You pass out (faint). °· You have chills. °This is an emergency. Do not wait to see if the problems will go away. Call your local emergency services (911 in [place] Do not drive yourself to the hospital. °MAKE SURE YOU:  °· Understand these instructions. °· Will watch your condition. °· Will get help right away if you are not doing well or get worse. °Document Released: 02/28/2008 Document Revised: 09/16/2013 Document Reviewed: 02/28/2008 °ExitCare® Patient Information ©2015 [employer]. This information is not intended to replace advice given to you by your health care provider. Make sure you discuss any questions you have with your  health care provider. ° °Angina Pectoris °Angina pectoris is extreme discomfort in your chest, neck, or arm. Your doctor may call it just angina. It is caused by a lack of oxygen to your heart wall. It may feel like tightness or heavy pressure. It may feel like a crushing or squeezing pain. Some people say it feels like gas. It may go down your shoulders, back, and arms. Some people have symptoms other than pain. These include: °· Tiredness. °· Shortness of breath. °· Cold sweats. °· Feeling sick to your stomach (nausea). °There are four types of angina: °· Stable angina. This type often lasts the same amount of time each time it happens. Activity, stress, or excitement can bring it on. It often gets better after taking a medicine called nitroglycerin. This goes under your tongue. °· Unstable angina. This type can happen when you are not active or even during sleep. It can suddenly get worse or happen more often. It may not get better after taking the special medicine. It can last up to 30 minutes. °· Microvascular angina. This type is more common in women. It may be more severe or last longer than other types. °· Prinzmetal angina. This type often happens when you are not active or in the early morning hours. °HOME CARE  °· Only take medicines as told by your doctor. °· Stay active or exercise more as told by your doctor. °· Limit very hard activity   activity as told by your doctor.  Limit heavy lifting as told by your doctor.  Keep a healthy weight.  Learn about and eat foods that are healthy for your heart.  Do not use any tobacco such as cigarettes, chewing tobacco, or e-cigarettes. GET HELP RIGHT AWAY IF:   You have chest, neck, deep shoulder, or arm pain or discomfort that lasts more than a few minutes.  You have chest, neck, deep shoulder, or arm pain or discomfort that goes away and comes back over and over again.  You have heavy sweating that seems to happen for no reason.  You have shortness of  breath or trouble breathing.  Your angina does not get better after a few minutes of rest.  Your angina does not get better after you take nitroglycerin medicine. These can all be symptoms of a heart attack. Get help right away. Call your local emergency service (911 in [place] Do not  drive yourself to the hospital. Do not  wait to for your symptoms to go away. MAKE SURE YOU:   Understand these instructions.  Will watch your condition.  Will get help right away if you are not doing well or get worse. Document Released: 02/28/2008 Document Revised: 09/16/2013 Document Reviewed: 01/13/2014 [facility] Patient Information 2015 [place], [place]. This information is not intended to replace advice given to you by your health care provider. Make sure you discuss any questions you have with your health care provider.

## 2015-06-12 ENCOUNTER — Encounter (HOSPITAL_COMMUNITY): Payer: Self-pay | Admitting: *Deleted

## 2015-06-12 ENCOUNTER — Emergency Department (HOSPITAL_COMMUNITY)
Admission: EM | Admit: 2015-06-12 | Discharge: 2015-06-12 | Disposition: A | Discharge status: Home / Self Care | Payer: PPO | Attending: Emergency Medicine | Admitting: Emergency Medicine

## 2015-06-12 ENCOUNTER — Emergency Department (HOSPITAL_COMMUNITY): Payer: PPO

## 2015-06-12 DIAGNOSIS — Z8639 Personal history of other endocrine, nutritional and metabolic disease: Secondary | ICD-10-CM | POA: Diagnosis not present

## 2015-06-12 DIAGNOSIS — Z8719 Personal history of other diseases of the digestive system: Secondary | ICD-10-CM | POA: Diagnosis not present

## 2015-06-12 DIAGNOSIS — R110 Nausea: Secondary | ICD-10-CM | POA: Diagnosis not present

## 2015-06-12 DIAGNOSIS — R51 Headache: Secondary | ICD-10-CM | POA: Insufficient documentation

## 2015-06-12 DIAGNOSIS — Z880 Allergy status to penicillin: Secondary | ICD-10-CM | POA: Insufficient documentation

## 2015-06-12 DIAGNOSIS — I10 Essential (primary) hypertension: Secondary | ICD-10-CM | POA: Diagnosis not present

## 2015-06-12 DIAGNOSIS — Z8739 Personal history of other diseases of the musculoskeletal system and connective tissue: Secondary | ICD-10-CM | POA: Diagnosis not present

## 2015-06-12 DIAGNOSIS — R079 Chest pain, unspecified: Secondary | ICD-10-CM | POA: Diagnosis not present

## 2015-06-12 DIAGNOSIS — J45909 Unspecified asthma, uncomplicated: Secondary | ICD-10-CM | POA: Diagnosis not present

## 2015-06-12 DIAGNOSIS — Z79899 Other long term (current) drug therapy: Secondary | ICD-10-CM | POA: Insufficient documentation

## 2015-06-12 DIAGNOSIS — Z87440 Personal history of urinary (tract) infections: Secondary | ICD-10-CM | POA: Insufficient documentation

## 2015-06-12 DIAGNOSIS — R232 Flushing: Secondary | ICD-10-CM | POA: Insufficient documentation

## 2015-06-12 LAB — CBC
HEMATOCRIT: 40.4 % (ref 36.0–46.0)
HEMOGLOBIN: 13.7 g/dL (ref 12.0–15.0)
MCH: 30.2 pg (ref 26.0–34.0)
MCHC: 33.9 g/dL (ref 30.0–36.0)
MCV: 89 fL (ref 78.0–100.0)
Platelets: 198 10*3/uL (ref 150–400)
RBC: 4.54 MIL/uL (ref 3.87–5.11)
RDW: 13 % (ref 11.5–15.5)
WBC: 4.2 10*3/uL (ref 4.0–10.5)

## 2015-06-12 LAB — BASIC METABOLIC PANEL
ANION GAP: 8 (ref 5–15)
BUN: 8 mg/dL (ref 6–20)
CALCIUM: 9.4 mg/dL (ref 8.9–10.3)
CO2: 25 mmol/L (ref 22–32)
Chloride: 106 mmol/L (ref 101–111)
Creatinine, Ser: 0.87 mg/dL (ref 0.44–1.00)
GFR calc Af Amer: >60 mL/min (ref >60)
GFR calc non Af Amer: >60 mL/min (ref >60)
GLUCOSE: 127 mg/dL — AB (ref 65–99)
Potassium: 3.5 mmol/L (ref 3.5–5.1)
Sodium: 139 mmol/L (ref 135–145)

## 2015-06-12 LAB — TROPONIN I

## 2015-06-12 NOTE — ED Notes (Signed)
Discharge instructions reviewed with patient. Understanding verbalized. Patient declined wheelchair at time of discharge. No distress noted at time of discharge.

## 2015-06-12 NOTE — Discharge Instructions (Signed)
Call the cardiology clinic on Monday to arrange a follow-up appointment. The contact information has been provided in this discharge summary.  Return to the emergency department if symptoms significantly worsen or change.   Chest Pain (Nonspecific) It is often hard to give a specific diagnosis for the cause of chest pain. There is always a chance that your pain could be related to something serious, such as a heart attack or a blood clot in the lungs. You need to follow up with your health care provider for further evaluation. CAUSES   Heartburn.  Pneumonia or bronchitis.  Anxiety or stress.  Inflammation around your heart (pericarditis) or lung (pleuritis or pleurisy).  A blood clot in the lung.  A collapsed lung (pneumothorax). It can develop suddenly on its own (spontaneous pneumothorax) or from trauma to the chest.  Shingles infection (herpes zoster virus). The chest wall is composed of bones, muscles, and cartilage. Any of these can be the source of the pain.  The bones can be bruised by injury.  The muscles or cartilage can be strained by coughing or overwork.  The cartilage can be affected by inflammation and become sore (costochondritis). DIAGNOSIS  Lab tests or other studies may be needed to find the cause of your pain. Your health care provider may have you take a test called an ambulatory electrocardiogram (ECG). An ECG records your heartbeat patterns over a 24-hour period. You may also have other tests, such as:  Transthoracic echocardiogram (TTE). During echocardiography, sound waves are used to evaluate how blood flows through your heart.  Transesophageal echocardiogram (TEE).  Cardiac monitoring. This allows your health care provider to monitor your heart rate and rhythm in real time.  Holter monitor. This is a portable device that records your heartbeat and can help diagnose heart arrhythmias. It allows your health care provider to track your heart activity for  several days, if needed.  Stress tests by exercise or by giving medicine that makes the heart beat faster. TREATMENT   Treatment depends on what may be causing your chest pain. Treatment may include:  Acid blockers for heartburn.  Anti-inflammatory medicine.  Pain medicine for inflammatory conditions.  Antibiotics if an infection is present.  You may be advised to change lifestyle habits. This includes stopping smoking and avoiding alcohol, caffeine, and chocolate.  You may be advised to keep your head raised (elevated) when sleeping. This reduces the chance of acid going backward from your stomach into your esophagus. Most of the time, nonspecific chest pain will improve within 2-3 days with rest and mild pain medicine.  HOME CARE INSTRUCTIONS   If antibiotics were prescribed, take them as directed. Finish them even if you start to feel better.  For the next few days, avoid physical activities that bring on chest pain. Continue physical activities as directed.  Do not use any tobacco products, including cigarettes, chewing tobacco, or electronic cigarettes.  Avoid drinking alcohol.  Only take medicine as directed by your health care provider.  Follow your health care provider's suggestions for further testing if your chest pain does not go away.  Keep any follow-up appointments you made. If you do not go to an appointment, you could develop lasting (chronic) problems with pain. If there is any problem keeping an appointment, call to reschedule. SEEK MEDICAL CARE IF:   Your chest pain does not go away, even after treatment.  You have a rash with blisters on your chest.  You have a fever. SEEK IMMEDIATE MEDICAL CARE  IF:   You have increased chest pain or pain that spreads to your arm, neck, jaw, back, or abdomen.  You have shortness of breath.  You have an increasing cough, or you cough up blood.  You have severe back or abdominal pain.  You feel nauseous or  vomit.  You have severe weakness.  You faint.  You have chills. This is an emergency. Do not wait to see if the pain will go away. Get medical help at once. Call your local emergency services (911 in [place] Do not drive yourself to the hospital. MAKE SURE YOU:   Understand these instructions.  Will watch your condition.  Will get help right away if you are not doing well or get worse. Document Released: 06/21/2005 Document Revised: 09/16/2013 Document Reviewed: 04/16/2008 [facility] Patient Information 2015 [place], [place]. This information is not intended to replace advice given to you by your health care provider. Make sure you discuss any questions you have with your health care provider.

## 2015-06-12 NOTE — ED Provider Notes (Signed)
CSN: [id number]   Arrival date & time 06/12/15 0029  History  This chart was scribed for Geoffery Lyons, MD by Bethel Born, ED Scribe. This patient was seen in room A12C/A12C and the patient's care was started at 12:59 AM.  Chief Complaint  Patient presents with  . Chest Pain    HPI The history is provided by the patient. No language interpreter was used.   Deborah Pruitt is a 74 y.o. female with PMHx of GERD, HLD, HTN, and asthma who presents to the Emergency Department complaining of intermittent and diffuse chest pain with onset 2-3 weeks ago. She has no pain at present. Initially the pain was primarily in the back and described as sore. Recently the pain has been in the chest.The pain is variably improved with movement and occurs and resolves spontaneously. She recently had a normal EKG at another ED.  Associated symptoms include hot flashes, nausea, and headache. Pt denies vomiting and SOB. No personal history of cardiac disease but her family has history of CAD. Pt has never had a stress test. Denies tobacco use.   Past Medical History  Diagnosis Date  . Hyperlipidemia   . Hypertension   . GERD (gastroesophageal reflux disease)   . Complication of anesthesia     Ileus post hip replacement  . Arthritis   . Asthma   . Urinary tract infection 2012    Past Surgical History  Procedure Laterality Date  . Cholecystectomy    . Joint replacement Left     hip  . Cataract extraction w/phaco Right 07/23/2014    Procedure: CATARACT EXTRACTION PHACO AND INTRAOCULAR LENS PLACEMENT RIGHT EYE CDE=6.24;  Surgeon: Gemma Payor, MD;  Location: [facility];  Service: Ophthalmology;  Laterality: Right;  . Total hip arthroplasty Left 1999  . Cataract extraction w/phaco Left 08/17/2014    Procedure: CATARACT EXTRACTION PHACO AND INTRAOCULAR LENS PLACEMENT LEFT EYE;  Surgeon: Gemma Payor, MD;  Location: [facility];  Service: Ophthalmology;  Laterality: Left;  CDE:4.65    Family History  Problem Relation  Age of Onset  . Stroke Mother   . Hypertension Mother   . Heart disease Father   . Liver cancer Brother   . Lung cancer Brother   . Stomach cancer Maternal Grandmother   . Heart disease Mother   . Colon cancer Neg Hx   . Colon polyps Neg Hx   . Diabetes Father   . Esophageal cancer Neg Hx     Social History  Substance Use Topics  . Smoking status: Never Smoker   . Smokeless tobacco: None  . Alcohol Use: No     Review of Systems 10 Systems reviewed and all are negative for acute change except as noted in the HPI. Home Medications   Prior to Admission medications   Medication Sig Start Date End Date Taking? Authorizing Alon Mazor  acidophilus (RISAQUAD) CAPS capsule Take 2 capsules by mouth daily.    Historical Tymere Depuy, MD  amLODipine (NORVASC) 10 MG tablet Take 1 tablet (10 mg total) by mouth daily. Patient taking differently: Take 5 mg by mouth daily. Pt takes 1/2 tablet, by mouth, daily 05/21/14   Junie Spencer, FNP  CALCIUM PO Take 2 tablets by mouth daily.    Historical Chyann Ambrocio, MD  Cholecalciferol (VITAMIN D3) 2000 UNITS TABS Take 1 tablet by mouth daily.    Historical Darol Cush, MD  Cyanocobalamin (VITAMIN B 12 PO) Take 1 tablet by mouth daily.    Historical Evadean Sproule, MD  famotidine (PEPCID)  20 MG tablet Take 1 tablet (20 mg total) by mouth 2 (two) times daily. 05/12/15   Elenora Gamma, MD  Multiple Vitamin (MULTIVITAMIN WITH MINERALS) TABS tablet Take 2 tablets by mouth daily.    Historical Katonya Blecher, MD    Allergies  Milk-related compounds; Ciprofloxacin; Dexilant; Penicillins; and Vioxx  Triage Vitals: BP 162/76 mmHg  Pulse 60  Temp(Src) 97.7 F (36.5 C) (Oral)  Resp 16  SpO2 95%  Physical Exam  Constitutional: She is oriented to person, place, and time. She appears well-developed and well-nourished. No distress.  HENT:  Head: Normocephalic and atraumatic.  Eyes: EOM are normal.  Neck: Normal range of motion.  Cardiovascular: Normal rate, regular rhythm  and normal heart sounds.   Pulmonary/Chest: Effort normal and breath sounds normal.  Abdominal: Soft. She exhibits no distension. There is no tenderness.  Musculoskeletal: Normal range of motion.  Neurological: She is alert and oriented to person, place, and time.  Skin: Skin is warm and dry.  Psychiatric: She has a normal mood and affect. Judgment normal.  Nursing note and vitals reviewed.   ED Course  Procedures   DIAGNOSTIC STUDIES: Oxygen Saturation is 95% on RA, normal by my interpretation.    COORDINATION OF CARE: 1:05 AM Discussed treatment plan which includes CXR, EKG, and labs with pt at bedside and pt agreed to plan.  2:23 AM I re-evaluated the patient and provided an update on the results of testing.  Labs Reviewed  BASIC METABOLIC PANEL - Abnormal; Notable for the following:    Glucose, Bld 127 (*)    All other components within normal limits  CBC  TROPONIN I    Imaging Review Dg Chest 2 View  06/12/2015   CLINICAL DATA:  Chest pain and nausea today.  EXAM: CHEST  2 VIEW  COMPARISON:  12/30/2006  FINDINGS: The cardiomediastinal contours are unchanged. The lungs are clear. Pulmonary vasculature is normal. No consolidation, pleural effusion, or pneumothorax. No acute osseous abnormalities are seen.  IMPRESSION: No acute pulmonary process.   Electronically Signed   By: Rubye Oaks M.D.   On: 06/12/2015 01:54     I, Geoffery Lyons, MD, personally reviewed and evaluated these images and lab results as part of my medical decision-making.   EKG Interpretation  Date/Time:  Saturday June 12 2015 00:31:30 EDT Ventricular Rate:  63 PR Interval:  148 QRS Duration: 70 QT Interval:  394 QTC Calculation: 403 R Axis:   27 Text Interpretation:  Normal sinus rhythm Normal ECG Confirmed by DELO  MD, DOUGLAS ([id number]) on 06/12/2015 12:50:01 AM    MDM   Final diagnoses:  None     Patient presents with complaints of chest discomfort that has been occurring  intermittently for the past week. She was seen by her primary doctor earlier today and was told tests were unremarkable. He presents here this evening after another episode that occurred. Her troponin is negative and EKG is unchanged. Her symptoms sound atypical for cardiac pain and with negative enzymes and unchanged EKG with symptoms intermittently for a week, I feel as though she is appropriate for discharge and outpatient follow-up. I will give her the contact information for the cardiology clinic to call on Monday to arrange an appointment. If she worsens, she understands to return here to be reevaluated.   I personally performed the services described in this documentation, which was scribed in my presence. The recorded information has been reviewed and is accurate.    Geoffery Lyons, MD 06/12/15  0231 

## 2015-06-12 NOTE — ED Notes (Signed)
The pt is c/o chest pain for over a week.  She was seen by her doctor earlier today in the office.  She had a ekg and everything was ok.  C/o posterior chest pain and nausea.  She no longer has GB

## 2015-06-24 ENCOUNTER — Telehealth: Payer: Self-pay | Admitting: Physician Assistant

## 2015-06-24 NOTE — Telephone Encounter (Signed)
New Message  This message is to inform you that we have made 3 consecutive attempts to contact your patient. We were unsuccessful in these attempts and wanted you to be aware of our efforts. We have mailed a letter to the pt to call in and schedule and will remove the patient from our referral work queue at this time.  Shanti [facility]

## 2015-06-24 NOTE — Telephone Encounter (Signed)
Ok. Thank you. I see that she presented to the ED on 06/12/15 and was referred to Cardiology also

## 2015-07-09 ENCOUNTER — Other Ambulatory Visit: Payer: Self-pay | Admitting: Family

## 2015-07-15 ENCOUNTER — Telehealth: Payer: Self-pay | Admitting: Family Medicine

## 2015-08-23 ENCOUNTER — Telehealth: Payer: Self-pay | Admitting: Family Medicine

## 2015-10-07 ENCOUNTER — Telehealth: Payer: Self-pay | Admitting: Family Medicine

## 2016-01-26 DIAGNOSIS — H2513 Age-related nuclear cataract, bilateral: Secondary | ICD-10-CM | POA: Diagnosis not present

## 2016-01-26 DIAGNOSIS — H40033 Anatomical narrow angle, bilateral: Secondary | ICD-10-CM | POA: Diagnosis not present

## 2016-01-31 ENCOUNTER — Other Ambulatory Visit: Payer: Self-pay | Admitting: Family Medicine

## 2016-02-04 DIAGNOSIS — H2640 Unspecified secondary cataract: Secondary | ICD-10-CM | POA: Diagnosis not present

## 2016-02-28 ENCOUNTER — Other Ambulatory Visit: Payer: Self-pay | Admitting: Family Medicine

## 2016-02-28 NOTE — Telephone Encounter (Signed)
Last seen 06/11/15 Deborah Pruitt  Dr Ermalinda MemosBradshaw  PCP

## 2016-03-25 DIAGNOSIS — G44219 Episodic tension-type headache, not intractable: Secondary | ICD-10-CM | POA: Diagnosis not present

## 2016-05-10 ENCOUNTER — Other Ambulatory Visit: Payer: Self-pay | Admitting: Family Medicine

## 2016-05-10 DIAGNOSIS — E785 Hyperlipidemia, unspecified: Secondary | ICD-10-CM

## 2016-05-12 ENCOUNTER — Encounter: Payer: Self-pay | Admitting: Family Medicine

## 2016-05-12 ENCOUNTER — Ambulatory Visit (INDEPENDENT_AMBULATORY_CARE_PROVIDER_SITE_OTHER): Payer: PPO | Admitting: Family Medicine

## 2016-05-12 VITALS — BP 138/83 | HR 66 | Temp 97.1°F | Ht 59.5 in | Wt 162.4 lb

## 2016-05-12 DIAGNOSIS — E785 Hyperlipidemia, unspecified: Secondary | ICD-10-CM

## 2016-05-12 DIAGNOSIS — I10 Essential (primary) hypertension: Secondary | ICD-10-CM | POA: Diagnosis not present

## 2016-05-12 DIAGNOSIS — Z0000 Encounter for general adult medical examination without abnormal findings: Secondary | ICD-10-CM | POA: Insufficient documentation

## 2016-05-12 DIAGNOSIS — K219 Gastro-esophageal reflux disease without esophagitis: Secondary | ICD-10-CM | POA: Diagnosis not present

## 2016-05-12 DIAGNOSIS — Z23 Encounter for immunization: Secondary | ICD-10-CM

## 2016-05-12 NOTE — Patient Instructions (Signed)
Great to see you!  Try to start walking 20 minutes 5 days a week  We will cal with your lab results within 1 week

## 2016-05-12 NOTE — Addendum Note (Signed)
Addended by: Margurite AuerbachOMPTON, KARLA G on: 05/12/2016 10:01 AM   Modules accepted: Orders

## 2016-05-12 NOTE — Progress Notes (Signed)
   HPI  Patient presents today here for follow-up of chronic medical condition.  Hypertension Good medication compliance Checks blood pressure at home ranging 120-135 over 70s. No headache, chest pain, dyspnea, palpitations, leg edema.  Hyperlipidemia Watching her diet, limiting fried and fatty foods. Good medication compliance.  GERD Almost daily symptoms, it is helped by Pepcid, although not completely. History of hiatal hernia Recently treated with Protonix for 10+ years  PMH: Smoking status noted ROS: Per HPI  Objective: BP 138/83   Pulse 66   Temp 97.1 F (36.2 C) (Oral)   Ht 4' 11.5" (1.511 m)   Wt 162 lb 6 oz (73.7 kg)   BMI 32.25 kg/m  Gen: NAD, alert, cooperative with exam HEENT: NCAT CV: RRR, good S1/S2, no murmur Resp: CTABL, no wheezes, non-labored Abd: SNTND, BS present, no guarding or organomegaly Ext: No edema, warm Neuro: Alert and oriented, No gross deficits  Assessment and plan:  # Hypertension Well-controlled No medication changes, continue amlodipine  # Hyperlipidemia Repeating labs Continue localized in   # GERD Reasonably well-controlled, continue H2 blocker  # Healthcare maintenance Ordered mammogram today Prevnar as well Pneumovax next year    Orders Placed This Encounter  Procedures  . CMP14+EGFR  . CBC with Differential/Platelet  . Lipid panel    Laroy Apple, MD [facility] 05/12/2016, 8:15 AM

## 2016-05-13 LAB — CBC WITH DIFFERENTIAL/PLATELET
BASOS ABS: 0 10*3/uL (ref 0.0–0.2)
BASOS: 1 %
EOS (ABSOLUTE): 0.1 10*3/uL (ref 0.0–0.4)
Eos: 4 %
Hematocrit: 39.9 % (ref 34.0–46.6)
Hemoglobin: 13.9 g/dL (ref 11.1–15.9)
IMMATURE GRANULOCYTES: 0 %
Immature Grans (Abs): 0 10*3/uL (ref 0.0–0.1)
Lymphocytes Absolute: 1.6 10*3/uL (ref 0.7–3.1)
Lymphs: 39 %
MCH: 31.7 pg (ref 26.6–33.0)
MCHC: 34.8 g/dL (ref 31.5–35.7)
MCV: 91 fL (ref 79–97)
MONOS ABS: 0.4 10*3/uL (ref 0.1–0.9)
Monocytes: 10 %
NEUTROS PCT: 46 %
Neutrophils Absolute: 1.8 10*3/uL (ref 1.4–7.0)
PLATELETS: 225 10*3/uL (ref 150–379)
RBC: 4.38 x10E6/uL (ref 3.77–5.28)
RDW: 13.9 % (ref 12.3–15.4)
WBC: 4 10*3/uL (ref 3.4–10.8)

## 2016-05-13 LAB — CMP14+EGFR
A/G RATIO: 1.7 (ref 1.2–2.2)
ALK PHOS: 60 IU/L (ref 39–117)
ALT: 16 IU/L (ref 0–32)
AST: 17 IU/L (ref 0–40)
Albumin: 4.3 g/dL (ref 3.5–4.8)
BILIRUBIN TOTAL: 0.4 mg/dL (ref 0.0–1.2)
BUN/Creatinine Ratio: 13 (ref 12–28)
BUN: 12 mg/dL (ref 8–27)
CHLORIDE: 104 mmol/L (ref 96–106)
CO2: 24 mmol/L (ref 18–29)
Calcium: 9.6 mg/dL (ref 8.7–10.3)
Creatinine, Ser: 0.91 mg/dL (ref 0.57–1.00)
GFR calc Af Amer: 73 mL/min/{1.73_m2} (ref >59)
GFR calc non Af Amer: 63 mL/min/{1.73_m2} (ref >59)
GLOBULIN, TOTAL: 2.5 g/dL (ref 1.5–4.5)
Glucose: 115 mg/dL — ABNORMAL HIGH (ref 65–99)
POTASSIUM: 4.1 mmol/L (ref 3.5–5.2)
SODIUM: 144 mmol/L (ref 134–144)
Total Protein: 6.8 g/dL (ref 6.0–8.5)

## 2016-05-13 LAB — LIPID PANEL
Chol/HDL Ratio: 5.7 ratio units — ABNORMAL HIGH (ref 0.0–4.4)
Cholesterol, Total: 153 mg/dL (ref 100–199)
HDL: 27 mg/dL — AB (ref >39)
LDL Calculated: 67 mg/dL (ref 0–99)
Triglycerides: 293 mg/dL — ABNORMAL HIGH (ref 0–149)
VLDL Cholesterol Cal: 59 mg/dL — ABNORMAL HIGH (ref 5–40)

## 2016-05-19 ENCOUNTER — Telehealth: Payer: Self-pay | Admitting: Family Medicine

## 2016-05-19 NOTE — Telephone Encounter (Signed)
Patient aware of results.

## 2016-05-21 ENCOUNTER — Other Ambulatory Visit: Payer: Self-pay | Admitting: Family Medicine

## 2016-05-21 DIAGNOSIS — K219 Gastro-esophageal reflux disease without esophagitis: Secondary | ICD-10-CM

## 2016-08-07 ENCOUNTER — Other Ambulatory Visit: Payer: Self-pay | Admitting: Family Medicine

## 2016-08-27 ENCOUNTER — Other Ambulatory Visit: Payer: Self-pay | Admitting: Family Medicine

## 2016-08-27 DIAGNOSIS — E785 Hyperlipidemia, unspecified: Secondary | ICD-10-CM

## 2016-09-20 ENCOUNTER — Encounter: Payer: PPO | Admitting: *Deleted

## 2016-09-25 ENCOUNTER — Other Ambulatory Visit: Payer: Self-pay | Admitting: Family Medicine

## 2016-09-25 DIAGNOSIS — K219 Gastro-esophageal reflux disease without esophagitis: Secondary | ICD-10-CM

## 2016-09-25 DIAGNOSIS — E785 Hyperlipidemia, unspecified: Secondary | ICD-10-CM

## 2016-11-01 ENCOUNTER — Ambulatory Visit (INDEPENDENT_AMBULATORY_CARE_PROVIDER_SITE_OTHER): Payer: PPO | Admitting: Family Medicine

## 2016-11-01 ENCOUNTER — Encounter: Payer: Self-pay | Admitting: Family Medicine

## 2016-11-01 VITALS — BP 159/78 | HR 67 | Temp 97.7°F | Ht 59.5 in | Wt 162.0 lb

## 2016-11-01 DIAGNOSIS — K2100 Gastro-esophageal reflux disease with esophagitis, without bleeding: Secondary | ICD-10-CM

## 2016-11-01 MED ORDER — PANTOPRAZOLE SODIUM 40 MG PO TBEC: 40.0000 mg | DELAYED_RELEASE_TABLET | Freq: Every day | ORAL | 2 refills | Status: DC

## 2016-11-01 NOTE — Progress Notes (Signed)
BP (!) 159/78   Pulse 67   Temp 97.7 F (36.5 C) (Oral)   Ht 4' 11.5" (1.511 m)   Wt 162 lb (73.5 kg)   BMI 32.17 kg/m    Subjective:    Patient ID: Deborah Pruitt, female    DOB: May 31, 1941, 76 y.o.   MRN: [id number]  HPI: Deborah Pruitt is a 76 y.o. female presenting on 11/01/2016 for constriction in esophagus?   HPI Dysphagia Patient has been having issues with a globus sensation and feeling like food gets stuck in her throat. She says it usually happens with potatoes and bread products and is usually very short-lived and then passes. This time it started last night when she was eating some chicken and she felt like it got stuck in her throat but it did not clear this time until just within the past 30 minutes. She does not feel nauseous or vomiting but she does feel like some of the acid and maybe some of the foods sometimes comes back up and nuts with clears it. She usually ends up getting hiccups afterwards. She used to be on protonic switch controlled greatly but then was switched to famotidine because of concerns of calcium and osteoporosis but she has not been doing very well since she was switched to famotidine. She says she has an EGD as recently as 2015 which did not show any major abnormalities. She denies any blood in her vomit or stool. Right now in office she says since it finally did clear her throat is feeling good but before she was feeling like something was stuck there and she had a sore throat because of it.  Relevant past medical, surgical, family and social history reviewed and updated as indicated. Interim medical history since our last visit reviewed. Allergies and medications reviewed and updated.  Review of Systems  Constitutional: Negative for chills and fever.  HENT: Positive for sore throat and trouble swallowing. Negative for congestion, ear discharge and ear pain.   Eyes: Negative for redness and visual disturbance.  Respiratory: Negative for  chest tightness and shortness of breath.   Cardiovascular: Negative for chest pain and leg swelling.  Gastrointestinal: Negative for abdominal pain, diarrhea, nausea and vomiting.  Genitourinary: Negative for difficulty urinating and dysuria.  Musculoskeletal: Negative for back pain and gait problem.  Skin: Negative for rash.  Neurological: Negative for light-headedness and headaches.  Psychiatric/Behavioral: Negative for agitation and behavioral problems.  All other systems reviewed and are negative.   Per HPI unless specifically indicated above   Allergies as of 11/01/2016      Reactions   Milk-related Compounds Other (See Comments)   "I almost died"   Ciprofloxacin Hives, Swelling   Dexilant [dexlansoprazole] Hives, Swelling   Penicillins Other (See Comments)   Childhood allergy.   Vioxx [rofecoxib] Hives, Swelling      Medication List       Accurate as of 11/01/16 10:40 AM. Always use your most recent med list.          acidophilus Caps capsule Take 2 capsules by mouth daily.   amLODipine 10 MG tablet Commonly known as:  NORVASC TAKE 1 TABLET (10 MG TOTAL) BY MOUTH DAILY.   famotidine 20 MG tablet Commonly known as:  PEPCID TAKE 1 TABLET (20 MG TOTAL) BY MOUTH 2 (TWO) TIMES DAILY.   multivitamin with minerals Tabs tablet Take 2 tablets by mouth daily.   omega-3 acid ethyl esters 1 g capsule Commonly known as:  LOVAZA TAKE 2 CAPSULES (2 G TOTAL) BY MOUTH 2 (TWO) TIMES DAILY.   pantoprazole 40 MG tablet Commonly known as:  PROTONIX Take 1 tablet (40 mg total) by mouth daily.   RESTASIS MULTIDOSE 0.05 % ophthalmic emulsion Generic drug:  cycloSPORINE   VITAMIN B 12 PO Take 1 tablet by mouth daily.   Vitamin D3 2000 units Tabs Take 1 tablet by mouth daily.          Objective:    BP (!) 159/78   Pulse 67   Temp 97.7 F (36.5 C) (Oral)   Ht 4' 11.5" (1.511 m)   Wt 162 lb (73.5 kg)   BMI 32.17 kg/m   Wt Readings from Last 3 Encounters:  11/01/16  162 lb (73.5 kg)  05/12/16 162 lb 6 oz (73.7 kg)  06/11/15 156 lb (70.8 kg)    Physical Exam  Constitutional: She is oriented to person, place, and time. She appears well-developed and well-nourished. No distress.  HENT:  Right Ear: External ear normal.  Left Ear: External ear normal.  Nose: Nose normal.  Mouth/Throat: Oropharynx is clear and moist. No oropharyngeal exudate.  Eyes: Conjunctivae are normal.  Neck: Neck supple. No thyromegaly present.  Cardiovascular: Normal rate, regular rhythm, normal heart sounds and intact distal pulses.   No murmur heard. Pulmonary/Chest: Effort normal and breath sounds normal. No respiratory distress. She has no wheezes.  Abdominal: Soft. Bowel sounds are normal. She exhibits no distension. There is no tenderness. There is no rebound.  Musculoskeletal: Normal range of motion. She exhibits no edema or tenderness.  Lymphadenopathy:    She has no cervical adenopathy.  Neurological: She is alert and oriented to person, place, and time. Coordination normal.  Skin: Skin is warm and dry. No rash noted. She is not diaphoretic.  Psychiatric: She has a normal mood and affect. Her behavior is normal.  Nursing note and vitals reviewed.     Assessment & Plan:   Problem List Items Addressed This Visit      Digestive   GERD (gastroesophageal reflux disease) - Primary   Relevant Medications   pantoprazole (PROTONIX) 40 MG tablet       Follow up plan: Return in about 3 months (around 01/29/2017), or if symptoms worsen or fail to improve, for Follow-up GERD.  Counseling provided for all of the vaccine components No orders of the defined types were placed in this encounter.   Arville CareJoshua Dettinger, MD [facility] 11/01/2016, 10:40 AM

## 2016-12-04 ENCOUNTER — Other Ambulatory Visit: Payer: Self-pay | Admitting: Family Medicine

## 2016-12-04 DIAGNOSIS — E785 Hyperlipidemia, unspecified: Secondary | ICD-10-CM

## 2017-01-19 ENCOUNTER — Other Ambulatory Visit: Payer: Self-pay | Admitting: Family Medicine

## 2017-02-08 ENCOUNTER — Other Ambulatory Visit: Payer: Self-pay | Admitting: Family Medicine

## 2017-02-08 DIAGNOSIS — E785 Hyperlipidemia, unspecified: Secondary | ICD-10-CM

## 2017-04-07 ENCOUNTER — Other Ambulatory Visit: Payer: Self-pay | Admitting: Family Medicine

## 2017-04-07 DIAGNOSIS — E785 Hyperlipidemia, unspecified: Secondary | ICD-10-CM

## 2017-05-31 ENCOUNTER — Other Ambulatory Visit: Payer: Self-pay | Admitting: Family Medicine

## 2017-05-31 DIAGNOSIS — E785 Hyperlipidemia, unspecified: Secondary | ICD-10-CM

## 2017-07-11 ENCOUNTER — Other Ambulatory Visit: Payer: Self-pay | Admitting: *Deleted

## 2017-07-11 NOTE — Telephone Encounter (Signed)
Last seen 10/2015

## 2017-07-12 MED ORDER — AMLODIPINE BESYLATE 10 MG PO TABS: ORAL_TABLET | ORAL | 2 refills | Status: DC

## 2017-07-26 ENCOUNTER — Other Ambulatory Visit: Payer: Self-pay | Admitting: Family Medicine

## 2017-07-26 DIAGNOSIS — K2100 Gastro-esophageal reflux disease with esophagitis, without bleeding: Secondary | ICD-10-CM

## 2017-07-27 NOTE — Telephone Encounter (Signed)
Last seen 11/01/16  Dr Dettinger  Dr Ermalinda MemosBradshaw PCP

## 2017-08-23 ENCOUNTER — Encounter: Payer: Self-pay | Admitting: Pediatrics

## 2017-08-23 ENCOUNTER — Ambulatory Visit (INDEPENDENT_AMBULATORY_CARE_PROVIDER_SITE_OTHER): Payer: PPO

## 2017-08-23 ENCOUNTER — Ambulatory Visit: Payer: PPO | Admitting: Pediatrics

## 2017-08-23 VITALS — BP 166/83 | HR 64 | Temp 97.2°F | Ht 59.5 in | Wt 163.0 lb

## 2017-08-23 DIAGNOSIS — Z23 Encounter for immunization: Secondary | ICD-10-CM

## 2017-08-23 DIAGNOSIS — M179 Osteoarthritis of knee, unspecified: Secondary | ICD-10-CM | POA: Diagnosis not present

## 2017-08-23 DIAGNOSIS — I10 Essential (primary) hypertension: Secondary | ICD-10-CM

## 2017-08-23 DIAGNOSIS — M79604 Pain in right leg: Secondary | ICD-10-CM

## 2017-08-23 NOTE — Progress Notes (Signed)
  Subjective:   Patient ID: Deborah Pruitt, female    DOB: 10/12/1940, 76 y.o.   MRN: [id number] CC: Leg Pain (right, evaluated by EMS)  HPI: Deborah Pruitt is a 76 y.o. female presenting for Leg Pain (right, evaluated by EMS)  Had pain in upper R leg last night, got so severe called EMS Started as a cramp walking to the bathroom When sitting back down started to feel nauseous, achy Couldn't stand due to severe pain Said so severe felt like she was dying Straightening leg when she had the pain helped a little bit Lasted about 5-10 min Yesterday was doing more than usual activities, moving tables, doing lots of  yard work  Today R upper leg feels slightly sore on the lateral side Points to approximately 15 cm circle in the right upper inner leg where pain was severe last night Denies any groin pain 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 Allergies and medications reviewed and updated. Social History   Tobacco Use  Smoking Status Never Smoker  Smokeless Tobacco Never Used   ROS: Per HPI   Objective:    BP (!) 166/83   Pulse 64   Temp (!) 97.2 F (36.2 C) (Oral)   Ht 4' 11.5" (1.511 m)   Wt 163 lb (73.9 kg)   BMI 32.37 kg/m   Wt Readings from Last 3 Encounters:  08/23/17 163 lb (73.9 kg)  11/01/16 162 lb (73.5 kg)  05/12/16 162 lb 6 oz (73.7 kg)    Gen: NAD, alert, cooperative with exam, NCAT EYES: EOMI, no conjunctival injection, or no icterus CV: NRRR, normal S1/S2, no murmur, distal pulses 2+ b/l Resp: CTABL, no wheezes, normal WOB Abd: +BS, soft, NTND. no guarding or organomegaly Ext: No edema, warm Neuro:  Alert and oriented, strength equal b/l UE and LE, coordination grossly normal MSK: Decreased internal and external rotation bilateral hips left more than right No pain with range of motion right hip ROM right knee 80 to 180 degrees, no pain with ROM  Mild tenderness lateral R upper thigh with palpation Skin: R Leg normal to inspection, no redness or induration, no rash   Assessment & Plan:  Deborah Pruitt was seen today for leg pain.  Diagnoses and all orders for this visit:  Right leg pain Increased activity last night Concerned with the severity of her pain though has since resolved and not returned Will get femur xray to look for bone lesion Return precautions dicussed -     DG FEMUR, MIN 2 VIEWS RIGHT; Future  Encounter for immunization -     Flu vaccine HIGH DOSE PF  HTN BP elevated today Taking BP meds regularly No symptoms At home usually much improved, 130s/60s per pt Cont to check at home Let me know if remains elevated  Follow up plan: Return if symptoms worsen or fail to improve. Rex Krasarol Vincent, MD [facility]

## 2017-09-05 ENCOUNTER — Other Ambulatory Visit: Payer: Self-pay

## 2017-09-05 DIAGNOSIS — E785 Hyperlipidemia, unspecified: Secondary | ICD-10-CM

## 2017-09-05 MED ORDER — OMEGA-3-ACID ETHYL ESTERS 1 G PO CAPS: 2.0000 | ORAL_CAPSULE | Freq: Two times a day (BID) | ORAL | 1 refills | Status: DC

## 2017-10-11 ENCOUNTER — Other Ambulatory Visit: Payer: Self-pay | Admitting: *Deleted

## 2017-10-11 DIAGNOSIS — E785 Hyperlipidemia, unspecified: Secondary | ICD-10-CM

## 2017-10-11 MED ORDER — OMEGA-3-ACID ETHYL ESTERS 1 G PO CAPS: 2.0000 | ORAL_CAPSULE | Freq: Two times a day (BID) | ORAL | 3 refills | Status: DC

## 2017-10-24 ENCOUNTER — Telehealth: Payer: Self-pay | Admitting: Family Medicine

## 2017-11-19 ENCOUNTER — Encounter: Payer: Self-pay | Admitting: Pediatrics

## 2017-11-28 ENCOUNTER — Other Ambulatory Visit: Payer: Self-pay

## 2017-11-28 ENCOUNTER — Encounter (HOSPITAL_COMMUNITY): Payer: Self-pay | Admitting: Obstetrics and Gynecology

## 2017-11-28 ENCOUNTER — Emergency Department (HOSPITAL_COMMUNITY): Payer: PPO

## 2017-11-28 ENCOUNTER — Observation Stay (HOSPITAL_COMMUNITY)
Admission: EM | Admit: 2017-11-28 | Discharge: 2017-11-30 | Disposition: A | Discharge status: Home / Self Care | Payer: PPO | Attending: Internal Medicine | Admitting: Internal Medicine

## 2017-11-28 DIAGNOSIS — S72002A Fracture of unspecified part of neck of left femur, initial encounter for closed fracture: Secondary | ICD-10-CM | POA: Diagnosis present

## 2017-11-28 DIAGNOSIS — W19XXXA Unspecified fall, initial encounter: Secondary | ICD-10-CM | POA: Diagnosis not present

## 2017-11-28 DIAGNOSIS — I10 Essential (primary) hypertension: Secondary | ICD-10-CM | POA: Diagnosis not present

## 2017-11-28 DIAGNOSIS — E876 Hypokalemia: Secondary | ICD-10-CM

## 2017-11-28 DIAGNOSIS — Z79899 Other long term (current) drug therapy: Secondary | ICD-10-CM | POA: Insufficient documentation

## 2017-11-28 DIAGNOSIS — Z96642 Presence of left artificial hip joint: Secondary | ICD-10-CM | POA: Insufficient documentation

## 2017-11-28 DIAGNOSIS — E785 Hyperlipidemia, unspecified: Secondary | ICD-10-CM | POA: Diagnosis not present

## 2017-11-28 DIAGNOSIS — S7290XA Unspecified fracture of unspecified femur, initial encounter for closed fracture: Secondary | ICD-10-CM | POA: Diagnosis present

## 2017-11-28 DIAGNOSIS — W1830XA Fall on same level, unspecified, initial encounter: Secondary | ICD-10-CM | POA: Insufficient documentation

## 2017-11-28 DIAGNOSIS — S72009A Fracture of unspecified part of neck of unspecified femur, initial encounter for closed fracture: Secondary | ICD-10-CM | POA: Diagnosis present

## 2017-11-28 DIAGNOSIS — S79912A Unspecified injury of left hip, initial encounter: Secondary | ICD-10-CM | POA: Diagnosis not present

## 2017-11-28 DIAGNOSIS — M9712XA Periprosthetic fracture around internal prosthetic left knee joint, initial encounter: Secondary | ICD-10-CM | POA: Diagnosis not present

## 2017-11-28 DIAGNOSIS — S79922A Unspecified injury of left thigh, initial encounter: Secondary | ICD-10-CM | POA: Diagnosis not present

## 2017-11-28 DIAGNOSIS — S72392A Other fracture of shaft of left femur, initial encounter for closed fracture: Secondary | ICD-10-CM | POA: Diagnosis not present

## 2017-11-28 DIAGNOSIS — M25552 Pain in left hip: Secondary | ICD-10-CM | POA: Diagnosis not present

## 2017-11-28 DIAGNOSIS — S299XXA Unspecified injury of thorax, initial encounter: Secondary | ICD-10-CM | POA: Diagnosis not present

## 2017-11-28 DIAGNOSIS — K219 Gastro-esophageal reflux disease without esophagitis: Secondary | ICD-10-CM | POA: Diagnosis not present

## 2017-11-28 DIAGNOSIS — M25559 Pain in unspecified hip: Secondary | ICD-10-CM | POA: Diagnosis not present

## 2017-11-28 DIAGNOSIS — M79605 Pain in left leg: Secondary | ICD-10-CM | POA: Diagnosis not present

## 2017-11-28 HISTORY — DX: Unspecified fracture of unspecified femur, initial encounter for closed fracture: S72.90XA

## 2017-11-28 HISTORY — DX: Fracture of unspecified part of neck of left femur, initial encounter for closed fracture: S72.002A

## 2017-11-28 LAB — COMPREHENSIVE METABOLIC PANEL WITH GFR
ALT: 20 U/L (ref 14–54)
AST: 25 U/L (ref 15–41)
Albumin: 4 g/dL (ref 3.5–5.0)
Alkaline Phosphatase: 69 U/L (ref 38–126)
Anion gap: 8 (ref 5–15)
BUN: 14 mg/dL (ref 6–20)
CO2: 25 mmol/L (ref 22–32)
Calcium: 9.1 mg/dL (ref 8.9–10.3)
Chloride: 106 mmol/L (ref 101–111)
Creatinine, Ser: 0.93 mg/dL (ref 0.44–1.00)
GFR calc Af Amer: >60 mL/min
GFR calc non Af Amer: 59 mL/min — ABNORMAL LOW
Glucose, Bld: 159 mg/dL — ABNORMAL HIGH (ref 65–99)
Potassium: 3.8 mmol/L (ref 3.5–5.1)
Sodium: 139 mmol/L (ref 135–145)
Total Bilirubin: 0.7 mg/dL (ref 0.3–1.2)
Total Protein: 7.2 g/dL (ref 6.5–8.1)

## 2017-11-28 LAB — CBC WITH DIFFERENTIAL/PLATELET
BASOS ABS: 0 10*3/uL (ref 0.0–0.1)
Basophils Relative: 0 %
Eosinophils Absolute: 0.2 10*3/uL (ref 0.0–0.7)
Eosinophils Relative: 3 %
HEMATOCRIT: 39.1 % (ref 36.0–46.0)
Hemoglobin: 13.2 g/dL (ref 12.0–15.0)
LYMPHS ABS: 1.4 10*3/uL (ref 0.7–4.0)
LYMPHS PCT: 26 %
MCH: 30.2 pg (ref 26.0–34.0)
MCHC: 33.8 g/dL (ref 30.0–36.0)
MCV: 89.5 fL (ref 78.0–100.0)
MONO ABS: 0.5 10*3/uL (ref 0.1–1.0)
MONOS PCT: 10 %
NEUTROS ABS: 3.2 10*3/uL (ref 1.7–7.7)
Neutrophils Relative %: 61 %
Platelets: 226 10*3/uL (ref 150–400)
RBC: 4.37 MIL/uL (ref 3.87–5.11)
RDW: 13 % (ref 11.5–15.5)
WBC: 5.3 10*3/uL (ref 4.0–10.5)

## 2017-11-28 LAB — PROTIME-INR
INR: 0.99
PROTHROMBIN TIME: 13 s (ref 11.4–15.2)

## 2017-11-28 LAB — TYPE AND SCREEN
ABO/RH(D): O POS
ANTIBODY SCREEN: NEGATIVE

## 2017-11-28 LAB — ABO/RH: ABO/RH(D): O POS

## 2017-11-28 MED ORDER — VITAMIN D 1000 UNITS PO TABS
2000.0000 [IU] | ORAL_TABLET | Freq: Every day | ORAL | Status: DC
  Administered 2017-11-29 – 2017-11-30 (×2): 2000 [IU] via ORAL
  Filled 2017-11-28 (×2): qty 2

## 2017-11-28 MED ORDER — ADULT MULTIVITAMIN W/MINERALS CH
2.0000 | ORAL_TABLET | Freq: Every day | ORAL | Status: DC
  Administered 2017-11-29 – 2017-11-30 (×2): 2 via ORAL
  Filled 2017-11-28 (×2): qty 2

## 2017-11-28 MED ORDER — MORPHINE SULFATE (PF) 2 MG/ML IV SOLN: 0.5000 mg | INTRAVENOUS | Status: DC | PRN

## 2017-11-28 MED ORDER — PANTOPRAZOLE SODIUM 40 MG PO TBEC
40.0000 mg | DELAYED_RELEASE_TABLET | Freq: Every day | ORAL | Status: DC
  Administered 2017-11-29 – 2017-11-30 (×2): 40 mg via ORAL
  Filled 2017-11-28 (×2): qty 1

## 2017-11-28 MED ORDER — OMEGA-3-ACID ETHYL ESTERS 1 G PO CAPS
2.0000 | ORAL_CAPSULE | Freq: Every day | ORAL | Status: DC
  Administered 2017-11-29 – 2017-11-30 (×2): 2 g via ORAL
  Filled 2017-11-28 (×2): qty 2

## 2017-11-28 MED ORDER — FENTANYL CITRATE (PF) 100 MCG/2ML IJ SOLN
50.0000 ug | Freq: Once | INTRAMUSCULAR | Status: AC
  Administered 2017-11-28: 50 ug via INTRAVENOUS
  Filled 2017-11-28: qty 2

## 2017-11-28 MED ORDER — AMLODIPINE BESYLATE 5 MG PO TABS
10.0000 mg | ORAL_TABLET | Freq: Every day | ORAL | Status: DC
  Administered 2017-11-29 – 2017-11-30 (×2): 10 mg via ORAL
  Filled 2017-11-28 (×2): qty 2

## 2017-11-28 MED ORDER — RISAQUAD PO CAPS
2.0000 | ORAL_CAPSULE | Freq: Every day | ORAL | Status: DC
  Administered 2017-11-29 – 2017-11-30 (×2): 2 via ORAL
  Filled 2017-11-28 (×2): qty 2

## 2017-11-28 MED ORDER — CYCLOBENZAPRINE HCL 10 MG PO TABS
10.0000 mg | ORAL_TABLET | Freq: Once | ORAL | Status: AC
  Administered 2017-11-28: 23:00:00 10 mg via ORAL
  Filled 2017-11-28: qty 1

## 2017-11-28 MED ORDER — HYDROCODONE-ACETAMINOPHEN 5-325 MG PO TABS
1.0000 | ORAL_TABLET | Freq: Four times a day (QID) | ORAL | Status: DC | PRN
  Administered 2017-11-29: 04:00:00 2 via ORAL
  Administered 2017-11-29 – 2017-11-30 (×2): 1 via ORAL
  Filled 2017-11-28: qty 2
  Filled 2017-11-28 (×2): qty 1
  Filled 2017-11-28: qty 2

## 2017-11-28 MED ORDER — ENOXAPARIN SODIUM 40 MG/0.4ML ~~LOC~~ SOLN
40.0000 mg | Freq: Every day | SUBCUTANEOUS | Status: DC
  Administered 2017-11-28 – 2017-11-29 (×2): 40 mg via SUBCUTANEOUS
  Filled 2017-11-28 (×2): qty 0.4

## 2017-11-28 MED ORDER — CYCLOSPORINE 0.05 % OP EMUL
1.0000 [drp] | Freq: Two times a day (BID) | OPHTHALMIC | Status: DC
  Administered 2017-11-28 – 2017-11-30 (×4): 1 [drp] via OPHTHALMIC
  Filled 2017-11-28 (×4): qty 1

## 2017-11-28 NOTE — H&P (Signed)
History and Physical    Deborah Pruitt:[id number] DOB: 07-06-1941 DOA: 11/28/2017  PCP: Elenora Gamma, MD  Patient coming from: Home.  Chief Complaint: Fall.  HPI: Deborah Pruitt is a 77 y.o. female with history of hypertension hyperlipidemia had a fall at fast food restaurant while trying to get up from the CT position.  Patient states he may have hit her head but did not lose consciousness.  He was finding it difficult to get up from the following position and 3 other people had to help her.  Following which he started developing pain in the left hip.  Denies any chest pain shortness of breath dizziness or palpitations.  ED Course: In the ER x-rays revealed left periprosthetic hip fracture.  Dr. Ranell Patrick on-call orthopedic surgeon was consulted and at this time the requested pain relief and conservative management.  Physical therapy evaluation.  Patient admitted for pain management and possible rehab placement.  Review of Systems: As per HPI, rest all negative.   Past Medical History:  Diagnosis Date  . Arthritis   . Asthma   . Complication of anesthesia    Ileus post hip replacement  . GERD (gastroesophageal reflux disease)   . Hyperlipidemia   . Hypertension   . Urinary tract infection 2012    Past Surgical History:  Procedure Laterality Date  . CATARACT EXTRACTION W/PHACO Right 07/23/2014   Procedure: CATARACT EXTRACTION PHACO AND INTRAOCULAR LENS PLACEMENT RIGHT EYE CDE=6.24;  Surgeon: Gemma Payor, MD;  Location: [facility];  Service: Ophthalmology;  Laterality: Right;  . CATARACT EXTRACTION W/PHACO Left 08/17/2014   Procedure: CATARACT EXTRACTION PHACO AND INTRAOCULAR LENS PLACEMENT LEFT EYE;  Surgeon: Gemma Payor, MD;  Location: [facility];  Service: Ophthalmology;  Laterality: Left;  CDE:4.65  . CHOLECYSTECTOMY    . JOINT REPLACEMENT Left    hip  . TOTAL HIP ARTHROPLASTY Left 1999     reports that  has never smoked. she has never used smokeless tobacco. She  reports that she does not drink alcohol or use drugs.  Allergies  Allergen Reactions  . Milk-Related Compounds Other (See Comments)    "I almost died"  . Ciprofloxacin Hives and Swelling  . Dexilant [Dexlansoprazole] Hives and Swelling  . Penicillins Other (See Comments)    Childhood allergy.  . Vioxx [Rofecoxib] Hives and Swelling    Family History  Problem Relation Age of Onset  . Stroke Mother   . Hypertension Mother   . Heart disease Mother   . Heart disease Father   . Diabetes Father   . Liver cancer Brother   . Lung cancer Brother   . Stomach cancer Maternal Grandmother   . Colon cancer Neg Hx   . Colon polyps Neg Hx   . Esophageal cancer Neg Hx     Prior to Admission medications   Medication Sig Start Date End Date Taking? Authorizing Provider  acidophilus (RISAQUAD) CAPS capsule Take 2 capsules by mouth daily.   Yes [provider]  amLODipine (NORVASC) 10 MG tablet TAKE 1 TABLET (10 MG TOTAL) BY MOUTH DAILY. 07/12/17  Yes Elenora Gamma, MD  Cholecalciferol (VITAMIN D3) 2000 UNITS TABS Take 1 tablet by mouth daily.   Yes [provider]  Cyanocobalamin (VITAMIN B 12 PO) Take 1 tablet by mouth daily.   Yes [provider]  Multiple Vitamin (MULTIVITAMIN WITH MINERALS) TABS tablet Take 2 tablets by mouth daily.   Yes [provider]  omega-3 acid ethyl esters (LOVAZA) 1  g capsule Take 2 capsules (2 g total) by mouth 2 (two) times daily. Patient taking differently: Take 2 capsules by mouth daily.  10/11/17  Yes Elenora Gamma, MD  pantoprazole (PROTONIX) 40 MG tablet TAKE 1 TABLET (40 MG TOTAL) BY MOUTH DAILY. 07/27/17  Yes Elenora Gamma, MD  RESTASIS MULTIDOSE 0.05 % ophthalmic emulsion Place 1 drop into both eyes 2 (two) times daily.  03/25/16  Yes [provider]  famotidine (PEPCID) 20 MG tablet TAKE 1 TABLET (20 MG TOTAL) BY MOUTH 2 (TWO) TIMES DAILY. Patient not taking: Reported on 11/28/2017 09/25/16   Elenora Gamma, MD    Physical Exam: Vitals:   11/28/17 1749 11/28/17 1951 11/28/17 2000 11/28/17 2100  BP: (!) 155/73 (!) 145/61 138/61 128/62  Pulse: 71 81 79 62  Resp: (!) 21 14 16 17   SpO2: 98% 96% 96% 94%  Weight: 73.9 kg (163 lb)     Height: 5' (1.524 m)         Constitutional: Moderately built and nourished. Vitals:   11/28/17 1749 11/28/17 1951 11/28/17 2000 11/28/17 2100  BP: (!) 155/73 (!) 145/61 138/61 128/62  Pulse: 71 81 79 62  Resp: (!) 21 14 16 17   SpO2: 98% 96% 96% 94%  Weight: 73.9 kg (163 lb)     Height: 5' (1.524 m)      Eyes: Anicteric no pallor. ENMT: No discharge from the ears eyes nose or mouth. Neck: No mass felt.  No neck rigidity no JVD appreciated. Respiratory: No rhonchi or crepitations. Cardiovascular: S1-S2 heard no murmurs appreciated. Abdomen: Soft nontender bowel sounds present. Musculoskeletal: Pain on moving left hip. Skin: No rash.  Skin appears warm. Neurologic: Alert awake oriented to time place and person.  Moves all extremities. Psychiatric: Appears normal.  Normal affect.   Labs on Admission: I have personally reviewed following labs and imaging studies  CBC: Recent Labs  Lab 11/28/17 1747  WBC 5.3  NEUTROABS 3.2  HGB 13.2  HCT 39.1  MCV 89.5  PLT 226   Basic Metabolic Panel: Recent Labs  Lab 11/28/17 1747  NA 139  K 3.8  CL 106  CO2 25  GLUCOSE 159*  BUN 14  CREATININE 0.93  CALCIUM 9.1   GFR: Estimated Creatinine Clearance: 47.7 mL/min (by C-G formula based on SCr of 0.93 mg/dL). Liver Function Tests: Recent Labs  Lab 11/28/17 1747  AST 25  ALT 20  ALKPHOS 69  BILITOT 0.7  PROT 7.2  ALBUMIN 4.0   No results for input(s): LIPASE, AMYLASE in the last 168 hours. No results for input(s): AMMONIA in the last 168 hours. Coagulation Profile: Recent Labs  Lab 11/28/17 1747  INR 0.99   Cardiac Enzymes: No results for input(s): CKTOTAL, CKMB, CKMBINDEX, TROPONINI in the last 168 hours. BNP (last 3  results) No results for input(s): PROBNP in the last 8760 hours. HbA1C: No results for input(s): HGBA1C in the last 72 hours. CBG: No results for input(s): GLUCAP in the last 168 hours. Lipid Profile: No results for input(s): CHOL, HDL, LDLCALC, TRIG, CHOLHDL, LDLDIRECT in the last 72 hours. Thyroid Function Tests: No results for input(s): TSH, T4TOTAL, FREET4, T3FREE, THYROIDAB in the last 72 hours. Anemia Panel: No results for input(s): VITAMINB12, FOLATE, FERRITIN, TIBC, IRON, RETICCTPCT in the last 72 hours. Urine analysis: No results found for: COLORURINE, APPEARANCEUR, LABSPEC, PHURINE, GLUCOSEU, HGBUR, BILIRUBINUR, KETONESUR, PROTEINUR, UROBILINOGEN, NITRITE, LEUKOCYTESUR Sepsis Labs: @LABRCNTIP (procalcitonin:4,lacticidven:4) )No results found for this or any previous visit (from the  past 240 hour(s)).   Radiological Exams on Admission: Dg Chest 2 View  Result Date: 11/28/2017 CLINICAL DATA:  Recent fall with findings suspicious of proximal left femoral fracture EXAM: CHEST - 2 VIEW COMPARISON:  06/12/2015 FINDINGS: Cardiac shadow is enlarged but accentuated by the frontal technique. The lungs are clear bilaterally. No sizable effusion is seen. Mild degenerative changes of the thoracic spine are noted. IMPRESSION: No acute abnormality noted. Electronically Signed   By: Alcide CleverMark  Lukens M.D.   On: 11/28/2017 18:48   Dg Hip Unilat With Pelvis 2-3 Views Left  Result Date: 11/28/2017 CLINICAL DATA:  Left hip pain following fall, initial encounter EXAM: DG HIP (WITH OR WITHOUT PELVIS) 2-3V LEFT COMPARISON:  None. FINDINGS: The pelvic ring appears intact. Left hip prosthesis is noted in satisfactory position. Mild cortical irregularity is noted along the proximal femur just below the greater trochanter suspicious for an underlying undisplaced fracture. IMPRESSION: Changes suspicious for an undisplaced fracture just below the trochanteric region in the proximal left femur. Electronically Signed    By: Alcide CleverMark  Lukens M.D.   On: 11/28/2017 18:45   Dg Femur Min 2 Views Left  Result Date: 11/28/2017 CLINICAL DATA:  Recent fall with left leg pain, initial encounter EXAM: LEFT FEMUR 2 VIEWS COMPARISON:  None. FINDINGS: Left hip prosthesis is noted. Better visualized on the femur film is a cortical irregularity just below the intratrochanteric region suspicious for undisplaced fracture. No other focal abnormality is noted. Degenerative changes about the knee joint are seen. IMPRESSION: Cortical irregularity just below the intratrochanteric region suspicious for undisplaced proximal femoral fracture. Electronically Signed   By: Alcide CleverMark  Lukens M.D.   On: 11/28/2017 18:48    EKG: Independently reviewed.  Normal sinus rhythm with PVCs.  Assessment/Plan Principal Problem:   Hip fracture, left (HCC) Active Problems:   Hyperlipidemia with target LDL less than 100   Essential hypertension   Femur fracture (HCC)   Hip fracture (HCC)    1. Left periprosthetic hip fracture -appreciate orthopedic surgery consult.  Patient placed on pain relief medications and physical therapy consult.  Patient probably may need rehab.  Orthopedic surgery not planning surgical management at this time.  Nonweightbearing. 2. Hypertension we will continue patient's home medication -amlodipine.  Will keep patient on PRN IV hydralazine. 3. Hyperlipidemia continue statins.   DVT prophylaxis: Lovenox since surgery is not anticipated. Code Status: Full code. Family Communication: Patient's husband. Disposition Plan: To be determined. Consults called: Orthopedic surgery. Admission status: Observation.   Eduard ClosArshad N Markita Stcharles MD [facility] Pager [phone].  If 7PM-7AM, please contact night-coverage [email or web address] Password [email or web address]  11/28/2017, 10:24 PM

## 2017-11-28 NOTE — Consult Note (Signed)
Reason for Consult:Left Hip Pain after fall Referring Physician: EDP  Deborah Pruitt is an 77 y.o. female.  HPI: 77 yo female who fell off of a seat at [employer] this afternoon injuring her left hip.  Patient complained of immediate severe pain in the left hip and thigh.  The patient has a history of left total hip replacement 20 years ago by Dr Lindwood Qua.  Patient unable to stand after fall due to pain and was transported by EMS to [facility] ED for further evaluation.  She denies other complaints currently although she says she hit her head but it does not hurt now.  Past Medical History:  Diagnosis Date  . Arthritis   . Asthma   . Complication of anesthesia    Ileus post hip replacement  . GERD (gastroesophageal reflux disease)   . Hyperlipidemia   . Hypertension   . Urinary tract infection 2012    Past Surgical History:  Procedure Laterality Date  . CATARACT EXTRACTION W/PHACO Right 07/23/2014   Procedure: CATARACT EXTRACTION PHACO AND INTRAOCULAR LENS PLACEMENT RIGHT EYE CDE=6.24;  Surgeon: Tonny Branch, MD;  Location: [facility];  Service: Ophthalmology;  Laterality: Right;  . CATARACT EXTRACTION W/PHACO Left 08/17/2014   Procedure: CATARACT EXTRACTION PHACO AND INTRAOCULAR LENS PLACEMENT LEFT EYE;  Surgeon: Tonny Branch, MD;  Location: [facility];  Service: Ophthalmology;  Laterality: Left;  CDE:4.65  . CHOLECYSTECTOMY    . JOINT REPLACEMENT Left    hip  . TOTAL HIP ARTHROPLASTY Left 1999    Family History  Problem Relation Age of Onset  . Stroke Mother   . Hypertension Mother   . Heart disease Mother   . Heart disease Father   . Diabetes Father   . Liver cancer Brother   . Lung cancer Brother   . Stomach cancer Maternal Grandmother   . Colon cancer Neg Hx   . Colon polyps Neg Hx   . Esophageal cancer Neg Hx     Social History:  reports that  has never smoked. she has never used smokeless tobacco. She reports that she does not drink alcohol or use drugs.  Allergies:  Allergies   Allergen Reactions  . Milk-Related Compounds Other (See Comments)    "I almost died"  . Ciprofloxacin Hives and Swelling  . Dexilant [Dexlansoprazole] Hives and Swelling  . Penicillins Other (See Comments)    Childhood allergy.  . Vioxx [Rofecoxib] Hives and Swelling    Medications: I have reviewed the patient's current medications.  Results for orders placed or performed during the hospital encounter of 11/28/17 (from the past 48 hour(s))  Type and screen [place]     Status: None   Collection Time: 11/28/17  5:45 PM  Result Value Ref Range   ABO/RH(D) O POS    Antibody Screen NEG    Sample Expiration      12/01/2017 Performed at [facility], [place] [address] [place], [place] [address]   ABO/Rh     Status: None   Collection Time: 11/28/17  5:45 PM  Result Value Ref Range   ABO/RH(D)      O POS Performed at [facility], [place] [address], [place], [place] [address]   CBC with Differential     Status: None   Collection Time: 11/28/17  5:47 PM  Result Value Ref Range   WBC 5.3 4.0 - 10.5 K/uL   RBC 4.37 3.87 - 5.11 MIL/uL   Hemoglobin 13.2 12.0 -  15.0 g/dL   HCT 39.1 36.0 - 46.0 %   MCV 89.5 78.0 - 100.0 fL   MCH 30.2 26.0 - 34.0 pg   MCHC 33.8 30.0 - 36.0 g/dL   RDW 13.0 11.5 - 15.5 %   Platelets 226 150 - 400 K/uL   Neutrophils Relative % 61 %   Neutro Abs 3.2 1.7 - 7.7 K/uL   Lymphocytes Relative 26 %   Lymphs Abs 1.4 0.7 - 4.0 K/uL   Monocytes Relative 10 %   Monocytes Absolute 0.5 0.1 - 1.0 K/uL   Eosinophils Relative 3 %   Eosinophils Absolute 0.2 0.0 - 0.7 K/uL   Basophils Relative 0 %   Basophils Absolute 0.0 0.0 - 0.1 K/uL    Comment: Performed at [facility], [place] [address], [place], [place] [address]  Comprehensive metabolic panel     Status: Abnormal   Collection Time: 11/28/17  5:47 PM  Result Value Ref Range   Sodium 139 135 - 145 mmol/L   Potassium 3.8 3.5 - 5.1 mmol/L    Chloride 106 101 - 111 mmol/L   CO2 25 22 - 32 mmol/L   Glucose, Bld 159 (H) 65 - 99 mg/dL   BUN 14 6 - 20 mg/dL   Creatinine, Ser 0.93 0.44 - 1.00 mg/dL   Calcium 9.1 8.9 - 10.3 mg/dL   Total Protein 7.2 6.5 - 8.1 g/dL   Albumin 4.0 3.5 - 5.0 g/dL   AST 25 15 - 41 U/L   ALT 20 14 - 54 U/L   Alkaline Phosphatase 69 38 - 126 U/L   Total Bilirubin 0.7 0.3 - 1.2 mg/dL   GFR calc non Af Amer 59 (L) >60 mL/min   GFR calc Af Amer >60 >60 mL/min    Comment: (NOTE) The eGFR has been calculated using the CKD EPI equation. This calculation has not been validated in all clinical situations. eGFR's persistently <60 mL/min signify possible Chronic Kidney Disease.    Anion gap 8 5 - 15    Comment: Performed at [facility], [place] [address], [place],  [address]  Protime-INR     Status: None   Collection Time: 11/28/17  5:47 PM  Result Value Ref Range   Prothrombin Time 13.0 11.4 - 15.2 seconds   INR 0.99     Comment: Performed at [facility], [place] [address], [place],  [address]    Dg Chest 2 View  Result Date: 11/28/2017 CLINICAL DATA:  Recent fall with findings suspicious of proximal left femoral fracture EXAM: CHEST - 2 VIEW COMPARISON:  06/12/2015 FINDINGS: Cardiac shadow is enlarged but accentuated by the frontal technique. The lungs are clear bilaterally. No sizable effusion is seen. Mild degenerative changes of the thoracic spine are noted. IMPRESSION: No acute abnormality noted. Electronically Signed   By: Inez Catalina M.D.   On: 11/28/2017 18:48   Dg Hip Unilat With Pelvis 2-3 Views Left  Result Date: 11/28/2017 CLINICAL DATA:  Left hip pain following fall, initial encounter EXAM: DG HIP (WITH OR WITHOUT PELVIS) 2-3V LEFT COMPARISON:  None. FINDINGS: The pelvic ring appears intact. Left hip prosthesis is noted in satisfactory position. Mild cortical irregularity is noted along the proximal femur just below the greater trochanter  suspicious for an underlying undisplaced fracture. IMPRESSION: Changes suspicious for an undisplaced fracture just below the trochanteric region in the proximal left femur. Electronically Signed   By: Inez Catalina M.D.   On: 11/28/2017 18:45  Dg Femur Min 2 Views Left  Result Date: 11/28/2017 CLINICAL DATA:  Recent fall with left leg pain, initial encounter EXAM: LEFT FEMUR 2 VIEWS COMPARISON:  None. FINDINGS: Left hip prosthesis is noted. Better visualized on the femur film is a cortical irregularity just below the intratrochanteric region suspicious for undisplaced fracture. No other focal abnormality is noted. Degenerative changes about the knee joint are seen. IMPRESSION: Cortical irregularity just below the intratrochanteric region suspicious for undisplaced proximal femoral fracture. Electronically Signed   By: Inez Catalina M.D.   On: 11/28/2017 18:48    ROS Blood pressure (!) 145/61, pulse 81, resp. rate 14, height 5' (1.524 m), weight 73.9 kg (163 lb), SpO2 96 %. Physical Exam  Well developed, well nourished female in no apparent distress relaxing on ED stretcher.  No tenderness in the CTL spine. Normal AROM of the neck.  Skull is nontender and there is no swelling nor deformity. Bilateral shoulders, elbows, and wrists with normal AROM and normal strength. Sensation intact bilateral UEs Abdomen nontender and non distended. Bilateral LEs well perfused with normal sensation and able to wiggle her toes. Right LE with normal hip and knee ROM Left LE guarded with no knee tenderness and moderate generalized swelling which is baseline for her after a remote tibia fracture. Unable to assess hip ROM due to pain Leg lengths grossly equal  Assessment/Plan: Left periprosthetic hip fracture (trochanteric) non displaced - admit for pain control and physical therapy Recommend conservative management for this low energy injury which should be stable and should heal without surgery. Minimal WB on the  left LE (foot flat just for balance) with a walker.  Will be at risk for DVT - needs prophylaxis.  I will alert Dr Gladstone Lighter to her admission. We will follow with you, thank you!  Nichalos Brenton,STEVEN R 11/28/2017, 9:03 PM

## 2017-11-28 NOTE — ED Provider Notes (Signed)
[facility]-EMERGENCY DEPT Provider Note   CSN: [id number] Arrival date & time: 11/28/17  1653     History   Chief Complaint Chief Complaint  Patient presents with  . Fall    HPI Deborah Pruitt is a 77 y.o. female.  The history is provided by the patient and medical records. No language interpreter was used.  Hip Pain  This is a new problem. The current episode started 1 to 2 hours ago. The problem occurs constantly. The problem has not changed since onset.Pertinent negatives include no chest pain, no abdominal pain, no headaches and no shortness of breath. The symptoms are aggravated by walking and standing. Nothing relieves the symptoms. She has tried nothing for the symptoms. The treatment provided no relief.    Past Medical History:  Diagnosis Date  . Arthritis   . Asthma   . Complication of anesthesia    Ileus post hip replacement  . GERD (gastroesophageal reflux disease)   . Hyperlipidemia   . Hypertension   . Urinary tract infection 2012    Patient Active Problem List   Diagnosis Date Noted  . Essential hypertension 08/23/2017  . Healthcare maintenance 05/12/2016  . Low serum vitamin D 08/26/2014  . Osteoporosis 08/26/2014  . Hyperlipidemia with target LDL less than 100 08/19/2013  . GERD (gastroesophageal reflux disease) 08/19/2013    Past Surgical History:  Procedure Laterality Date  . CATARACT EXTRACTION W/PHACO Right 07/23/2014   Procedure: CATARACT EXTRACTION PHACO AND INTRAOCULAR LENS PLACEMENT RIGHT EYE CDE=6.24;  Surgeon: Gemma Payor, MD;  Location: [facility];  Service: Ophthalmology;  Laterality: Right;  . CATARACT EXTRACTION W/PHACO Left 08/17/2014   Procedure: CATARACT EXTRACTION PHACO AND INTRAOCULAR LENS PLACEMENT LEFT EYE;  Surgeon: Gemma Payor, MD;  Location: [facility];  Service: Ophthalmology;  Laterality: Left;  CDE:4.65  . CHOLECYSTECTOMY    . JOINT REPLACEMENT Left    hip  . TOTAL HIP ARTHROPLASTY Left 1999    OB  History    No data available       Home Medications    Prior to Admission medications   Medication Sig Start Date End Date Taking? Authorizing Provider  acidophilus (RISAQUAD) CAPS capsule Take 2 capsules by mouth daily.    [provider]  amLODipine (NORVASC) 10 MG tablet TAKE 1 TABLET (10 MG TOTAL) BY MOUTH DAILY. 07/12/17   Elenora Gamma, MD  Cholecalciferol (VITAMIN D3) 2000 UNITS TABS Take 1 tablet by mouth daily.    [provider]  Cyanocobalamin (VITAMIN B 12 PO) Take 1 tablet by mouth daily.    [provider]  famotidine (PEPCID) 20 MG tablet TAKE 1 TABLET (20 MG TOTAL) BY MOUTH 2 (TWO) TIMES DAILY. 09/25/16   Elenora Gamma, MD  Multiple Vitamin (MULTIVITAMIN WITH MINERALS) TABS tablet Take 2 tablets by mouth daily.    [provider]  omega-3 acid ethyl esters (LOVAZA) 1 g capsule Take 2 capsules (2 g total) by mouth 2 (two) times daily. 10/11/17   Elenora Gamma, MD  pantoprazole (PROTONIX) 40 MG tablet TAKE 1 TABLET (40 MG TOTAL) BY MOUTH DAILY. 07/27/17   Elenora Gamma, MD  RESTASIS MULTIDOSE 0.05 % ophthalmic emulsion  03/25/16   [provider]    Family History Family History  Problem Relation Age of Onset  . Stroke Mother   . Hypertension Mother   . Heart disease Mother   . Heart disease Father   . Diabetes Father   . Liver  cancer Brother   . Lung cancer Brother   . Stomach cancer Maternal Grandmother   . Colon cancer Neg Hx   . Colon polyps Neg Hx   . Esophageal cancer Neg Hx     Social History Social History   Tobacco Use  . Smoking status: Never Smoker  . Smokeless tobacco: Never Used  Substance Use Topics  . Alcohol use: No  . Drug use: No     Allergies   Milk-related compounds; Ciprofloxacin; Dexilant [dexlansoprazole]; Penicillins; and Vioxx [rofecoxib]   Review of Systems Review of Systems  Constitutional: Negative for chills, diaphoresis, fatigue and fever.  HENT: Negative for  congestion.   Respiratory: Negative for cough, chest tightness, shortness of breath, wheezing and stridor.   Cardiovascular: Negative for chest pain and palpitations.  Gastrointestinal: Negative for abdominal pain, constipation, diarrhea, nausea and rectal pain.  Genitourinary: Negative for dysuria, enuresis and flank pain.  Musculoskeletal: Negative for back pain, neck pain and neck stiffness.  Skin: Negative for rash and wound.  Neurological: Negative for weakness, light-headedness, numbness and headaches.  Psychiatric/Behavioral: Negative for agitation.  All other systems reviewed and are negative.    Physical Exam Updated Vital Signs BP (!) 161/72 (BP Location: Left Arm)   Pulse 65   Temp 98.2 F (36.8 C) (Oral)   Resp 16   Ht 5' (1.524 m)   Wt 73.9 kg (163 lb)   SpO2 98%   BMI 31.83 kg/m   Physical Exam  Constitutional: She appears well-developed and well-nourished. No distress.  HENT:  Head: Normocephalic and atraumatic.  Mouth/Throat: Oropharynx is clear and moist. No oropharyngeal exudate.  Eyes: Conjunctivae and EOM are normal. Pupils are equal, round, and reactive to light.  Neck: Normal range of motion.  Cardiovascular: Normal rate and intact distal pulses.  No murmur heard. Pulmonary/Chest: Effort normal. No stridor. No respiratory distress. She has no rales. She exhibits no tenderness.  Abdominal: Soft. Bowel sounds are normal. She exhibits no distension. There is no tenderness.  Musculoskeletal: She exhibits tenderness and deformity. She exhibits no edema.       Left elbow: She exhibits normal range of motion, no swelling, no effusion and no deformity. No tenderness found.       Left hip: She exhibits tenderness and deformity. She exhibits no laceration.       Arms: Neurological: She is alert. No cranial nerve deficit or sensory deficit. She exhibits normal muscle tone.  Skin: Capillary refill takes less than 2 seconds. No rash noted. She is not diaphoretic. No  erythema.  Nursing note and vitals reviewed.    ED Treatments / Results  Labs (all labs ordered are listed, but only abnormal results are displayed) Labs Reviewed  COMPREHENSIVE METABOLIC PANEL - Abnormal; Notable for the following components:      Result Value   Glucose, Bld 159 (*)    GFR calc non Af Amer 59 (*)    All other components within normal limits  CBC WITH DIFFERENTIAL/PLATELET  PROTIME-INR  TYPE AND SCREEN  ABO/RH    EKG  EKG Interpretation  Date/Time:  Wednesday November 28 2017 17:57:19 EST Ventricular Rate:  70 PR Interval:    QRS Duration: 88 QT Interval:  405 QTC Calculation: 437 R Axis:   34 Text Interpretation:  Sinus rhythm Multiple ventricular premature complexes When compared to prior,  more PVC present.  No STEMI Confirmed by Theda Belfastegeler, Chris ([id number]) on 11/28/2017 6:18:23 PM       Radiology Dg Chest 2  View  Result Date: 11/28/2017 CLINICAL DATA:  Recent fall with findings suspicious of proximal left femoral fracture EXAM: CHEST - 2 VIEW COMPARISON:  06/12/2015 FINDINGS: Cardiac shadow is enlarged but accentuated by the frontal technique. The lungs are clear bilaterally. No sizable effusion is seen. Mild degenerative changes of the thoracic spine are noted. IMPRESSION: No acute abnormality noted. Electronically Signed   By: Alcide Clever M.D.   On: 11/28/2017 18:48   Dg Hip Unilat With Pelvis 2-3 Views Left  Result Date: 11/28/2017 CLINICAL DATA:  Left hip pain following fall, initial encounter EXAM: DG HIP (WITH OR WITHOUT PELVIS) 2-3V LEFT COMPARISON:  None. FINDINGS: The pelvic ring appears intact. Left hip prosthesis is noted in satisfactory position. Mild cortical irregularity is noted along the proximal femur just below the greater trochanter suspicious for an underlying undisplaced fracture. IMPRESSION: Changes suspicious for an undisplaced fracture just below the trochanteric region in the proximal left femur. Electronically Signed   By: Alcide Clever  M.D.   On: 11/28/2017 18:45   Dg Femur Min 2 Views Left  Result Date: 11/28/2017 CLINICAL DATA:  Recent fall with left leg pain, initial encounter EXAM: LEFT FEMUR 2 VIEWS COMPARISON:  None. FINDINGS: Left hip prosthesis is noted. Better visualized on the femur film is a cortical irregularity just below the intratrochanteric region suspicious for undisplaced fracture. No other focal abnormality is noted. Degenerative changes about the knee joint are seen. IMPRESSION: Cortical irregularity just below the intratrochanteric region suspicious for undisplaced proximal femoral fracture. Electronically Signed   By: Alcide Clever M.D.   On: 11/28/2017 18:48    Procedures Procedures (including critical care time)  Medications Ordered in ED Medications  acidophilus (RISAQUAD) capsule 2 capsule (not administered)  amLODipine (NORVASC) tablet 10 mg (not administered)  cholecalciferol (VITAMIN D) tablet 2,000 Units (not administered)  multivitamin with minerals tablet 2 tablet (not administered)  omega-3 acid ethyl esters (LOVAZA) capsule 2 g (not administered)  pantoprazole (PROTONIX) EC tablet 40 mg (not administered)  cycloSPORINE (RESTASIS) 0.05 % ophthalmic emulsion 1 drop (1 drop Both Eyes Given 11/28/17 2305)  HYDROcodone-acetaminophen (NORCO/VICODIN) 5-325 MG per tablet 1-2 tablet (not administered)  morphine 2 MG/ML injection 0.5 mg (not administered)  enoxaparin (LOVENOX) injection 40 mg (40 mg Subcutaneous Given 11/28/17 2305)  fentaNYL (SUBLIMAZE) injection 50 mcg (50 mcg Intravenous Given 11/28/17 1953)  cyclobenzaprine (FLEXERIL) tablet 10 mg (10 mg Oral Given 11/28/17 2305)     Initial Impression / Assessment and Plan / ED Course  I have reviewed the triage vital signs and the nursing notes.  Pertinent labs & imaging results that were available during my care of the patient were reviewed by me and considered in my medical decision making (see chart for details).     GAYLA BENN is a  77 y.o. female with a past medical history significant for hypertension, hyperlipidemia, GERD, osteoporosis, and prior left hip fracture status post replacement in 1999 who presents with a fall and left hip injury.  Patient reports that she was at [employer] this afternoon when she slid out of the bench falling to the ground.  She reports that she had immediate onset of left hip pain and leg shortening.  She tried to stand up and was unable to bear weight due to pain.  She describes the pain as 10 out of 10 when she tries to bear weight or move it however at rest it is a 3 out of 10.  She reports some tingling in  her left leg but still able to wiggle her toes and ankle.  She denies any pain in her lower leg or her knee.  Pain is primarily from the mid thigh towards the hip on the left side.  Patient also reports a superficial abrasion on her left elbow but denies pain in this location.  She reports hitting her head but denies any headache, vision changes, nausea or vomiting.  Patient denies other complaints on arrival and had no preceding symptoms today.  On exam, patient has shortening of the left lower leg.  Patient had normal dorsalis pedis pulse in her bilateral lower extremities.  Patient had normal sensation in her legs.  Patient had tenderness in the left hip and left thigh.  No evidence of skin injury on the left leg.  Patient had a small less than 1 cm abrasion in the left elbow that was hemostatic.  Normal elbow range of motion.  Symmetric grip strength in upper extremities.  Normal pulses and sensation in upper extremities.  Patient had no focal neurologic deficits on my exam however her range of motion was limited due to hip pain.  Lungs were clear and chest was nontender.  Based on patient's history of hip fracture I suspect patient has recurrent hip fracture.  Patient will have imaging as well as screening laboratory testing chest x-ray and EKG for preoperative screening purposes.    Patient did not  want pain medicine on arrival however, she may need pain management as well.  Patient reports that [facility] orthopedics took care of her previously for her different leg surgeries and it was primarily managed by Dr. Darrelyn Hillock.   Anticipate speaking with transfer orthopedics after imaging and workup is completed.  7:43 PM Diagnostic imaging revealed a nondisplaced proximal left femur fracture just below the intertrochanteric region.  [facility] will be called.  Patient began developing worsening pain and muscle spasms, will provide pain medication.  Screening laboratory testing was overall reassuring.  Chest x-ray unremarkable.    Suspect patient will be admitted as she is unable to bear weight on her femur fracture.  7:58 PM Orthopedic return my call and recommended patient be admitted to hospitalist service and they will come see the patient.  They felt that based on the imaging report of nondisplaced fracture, she may not need surgery.  Hospitalist and will be called for admission.   Final Clinical Impressions(s) / ED Diagnoses   Final diagnoses:  Fall, initial encounter  Other closed fracture of shaft of left femur, initial encounter (HCC)    Clinical Impression: 1. Fall, initial encounter   2. Other closed fracture of shaft of left femur, initial encounter (HCC)     Disposition: Admit  This note was prepared with assistance of Dragon voice recognition software. Occasional wrong-word or sound-a-like substitutions may have occurred due to the inherent limitations of voice recognition software.      Kemiah Booz, Canary Brim, MD 11/29/17 [phone]

## 2017-11-28 NOTE — ED Notes (Signed)
ED TO INPATIENT HANDOFF REPORT  Name/Age/Gender Deborah Pruitt 77 y.o. female  Code Status Code Status History    This patient does not have a recorded code status. Please follow your organizational policy for patients in this situation.    Advance Directive Documentation     Most Recent Value  Type of Advance Directive  Healthcare Power of Attorney, Living will  Pre-existing out of facility DNR order (yellow form or pink MOST form)  No data  "MOST" Form in Place?  No data      Home/SNF/Other Nursing Home  Chief Complaint Fall; Hip Pain  Level of Care/Admitting Diagnosis ED Disposition    ED Disposition Condition Comment   Admit  Hospital Area: [facility] [[id number]]  Level of Care: Med-Surg [16]  Diagnosis: Femur fracture [facility]) [045409]  Admitting Physician: Rise Patience [phone]  Attending Physician: Rise Patience [[id number]]  PT Class (Do Not Modify): Observation [104]  PT Acc Code (Do Not Modify): Observation [10022]       Medical History Past Medical History:  Diagnosis Date  . Arthritis   . Asthma   . Complication of anesthesia    Ileus post hip replacement  . GERD (gastroesophageal reflux disease)   . Hyperlipidemia   . Hypertension   . Urinary tract infection 2012    Allergies Allergies  Allergen Reactions  . Milk-Related Compounds Other (See Comments)    "I almost died"  . Ciprofloxacin Hives and Swelling  . Dexilant [Dexlansoprazole] Hives and Swelling  . Penicillins Other (See Comments)    Childhood allergy.  . Vioxx [Rofecoxib] Hives and Swelling    IV Location/Drains/Wounds Patient Lines/Drains/Airways Status   Active Line/Drains/Airways    Name:   Placement date:   Placement time:   Site:   Days:   Peripheral IV 11/28/17 Left Antecubital   11/28/17    1752    Antecubital   less than 1   Incision (Closed) 07/23/14 Eye Right   07/23/14    1032     1224   Incision (Closed) 08/17/14 Eye Left   08/17/14     1133     1199          Labs/Imaging Results for orders placed or performed during the hospital encounter of 11/28/17 (from the past 48 hour(s))  Type and screen [place]     Status: None   Collection Time: 11/28/17  5:45 PM  Result Value Ref Range   ABO/RH(D) O POS    Antibody Screen NEG    Sample Expiration      12/01/2017 Performed at [facility], [place] [address], [place], [place] [address]   ABO/Rh     Status: None   Collection Time: 11/28/17  5:45 PM  Result Value Ref Range   ABO/RH(D)      O POS Performed at [facility], [place] [address], [place], [place] [address]   CBC with Differential     Status: None   Collection Time: 11/28/17  5:47 PM  Result Value Ref Range   WBC 5.3 4.0 - 10.5 K/uL   RBC 4.37 3.87 - 5.11 MIL/uL   Hemoglobin 13.2 12.0 - 15.0 g/dL   HCT 39.1 36.0 - 46.0 %   MCV 89.5 78.0 - 100.0 fL   MCH 30.2 26.0 - 34.0 pg   MCHC 33.8 30.0 - 36.0 g/dL   RDW 13.0 11.5 - 15.5 %   Platelets 226 150 - 400  K/uL   Neutrophils Relative % 61 %   Neutro Abs 3.2 1.7 - 7.7 K/uL   Lymphocytes Relative 26 %   Lymphs Abs 1.4 0.7 - 4.0 K/uL   Monocytes Relative 10 %   Monocytes Absolute 0.5 0.1 - 1.0 K/uL   Eosinophils Relative 3 %   Eosinophils Absolute 0.2 0.0 - 0.7 K/uL   Basophils Relative 0 %   Basophils Absolute 0.0 0.0 - 0.1 K/uL    Comment: Performed at [facility], [place] [address], [place], [place] [address]  Comprehensive metabolic panel     Status: Abnormal   Collection Time: 11/28/17  5:47 PM  Result Value Ref Range   Sodium 139 135 - 145 mmol/L   Potassium 3.8 3.5 - 5.1 mmol/L   Chloride 106 101 - 111 mmol/L   CO2 25 22 - 32 mmol/L   Glucose, Bld 159 (H) 65 - 99 mg/dL   BUN 14 6 - 20 mg/dL   Creatinine, Ser 0.93 0.44 - 1.00 mg/dL   Calcium 9.1 8.9 - 10.3 mg/dL   Total Protein 7.2 6.5 - 8.1 g/dL   Albumin 4.0 3.5 - 5.0 g/dL   AST 25 15 - 41 U/L   ALT 20 14 - 54 U/L    Alkaline Phosphatase 69 38 - 126 U/L   Total Bilirubin 0.7 0.3 - 1.2 mg/dL   GFR calc non Af Amer 59 (L) >60 mL/min   GFR calc Af Amer >60 >60 mL/min    Comment: (NOTE) The eGFR has been calculated using the CKD EPI equation. This calculation has not been validated in all clinical situations. eGFR's persistently <60 mL/min signify possible Chronic Kidney Disease.    Anion gap 8 5 - 15    Comment: Performed at [facility], [place] [address], [place], [place] [address]  Protime-INR     Status: None   Collection Time: 11/28/17  5:47 PM  Result Value Ref Range   Prothrombin Time 13.0 11.4 - 15.2 seconds   INR 0.99     Comment: Performed at [facility], [place] [address] [place], [place] [address]   Dg Chest 2 View  Result Date: 11/28/2017 CLINICAL DATA:  Recent fall with findings suspicious of proximal left femoral fracture EXAM: CHEST - 2 VIEW COMPARISON:  06/12/2015 FINDINGS: Cardiac shadow is enlarged but accentuated by the frontal technique. The lungs are clear bilaterally. No sizable effusion is seen. Mild degenerative changes of the thoracic spine are noted. IMPRESSION: No acute abnormality noted. Electronically Signed   By: Inez Catalina M.D.   On: 11/28/2017 18:48   Dg Hip Unilat With Pelvis 2-3 Views Left  Result Date: 11/28/2017 CLINICAL DATA:  Left hip pain following fall, initial encounter EXAM: DG HIP (WITH OR WITHOUT PELVIS) 2-3V LEFT COMPARISON:  None. FINDINGS: The pelvic ring appears intact. Left hip prosthesis is noted in satisfactory position. Mild cortical irregularity is noted along the proximal femur just below the greater trochanter suspicious for an underlying undisplaced fracture. IMPRESSION: Changes suspicious for an undisplaced fracture just below the trochanteric region in the proximal left femur. Electronically Signed   By: Inez Catalina M.D.   On: 11/28/2017 18:45   Dg Femur Min 2 Views Left  Result Date: 11/28/2017 CLINICAL DATA:   Recent fall with left leg pain, initial encounter EXAM: LEFT FEMUR 2 VIEWS COMPARISON:  None. FINDINGS: Left hip prosthesis is noted. Better visualized on the femur film is a cortical irregularity just below the  intratrochanteric region suspicious for undisplaced fracture. No other focal abnormality is noted. Degenerative changes about the knee joint are seen. IMPRESSION: Cortical irregularity just below the intratrochanteric region suspicious for undisplaced proximal femoral fracture. Electronically Signed   By: Inez Catalina M.D.   On: 11/28/2017 18:48    Pending Labs Unresulted Labs (From admission, onward)   None      Vitals/Pain Today's Vitals   11/28/17 1745 11/28/17 1749 11/28/17 1951 11/28/17 1952  BP: (!) 155/73 (!) 155/73 (!) 145/61   Pulse: 72 71 81   Resp: 18 (!) 21 14   SpO2: 98% 98% 96%   Weight:  163 lb (73.9 kg)    Height:  5' (1.524 m)    PainSc:    6     Isolation Precautions No active isolations  Medications Medications  fentaNYL (SUBLIMAZE) injection 50 mcg (50 mcg Intravenous Given 11/28/17 1953)    Mobility walks

## 2017-11-28 NOTE — ED Triage Notes (Signed)
Per EMS:  Pt was at [employer] and went to sit on a bench and slid and hit the ground on L hip. Shortening noted Per EMS Pt had a L hip replacement in 1999. Pt reports a pain of 10/10. Pt has had a significant ortho hx. 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

## 2017-11-28 NOTE — Progress Notes (Signed)
Pt gave permission for husband to remain in the room while nursing admission history was done. Deborah Pruitt. Deborah Pruitt BSN, RN-BC Admissions RN 11/28/2017 8:39 PM

## 2017-11-29 DIAGNOSIS — S72002G Fracture of unspecified part of neck of left femur, subsequent encounter for closed fracture with delayed healing: Secondary | ICD-10-CM

## 2017-11-29 DIAGNOSIS — E876 Hypokalemia: Secondary | ICD-10-CM | POA: Diagnosis not present

## 2017-11-29 DIAGNOSIS — I10 Essential (primary) hypertension: Secondary | ICD-10-CM | POA: Diagnosis not present

## 2017-11-29 DIAGNOSIS — W19XXXA Unspecified fall, initial encounter: Secondary | ICD-10-CM | POA: Diagnosis not present

## 2017-11-29 DIAGNOSIS — S72392A Other fracture of shaft of left femur, initial encounter for closed fracture: Secondary | ICD-10-CM | POA: Diagnosis not present

## 2017-11-29 DIAGNOSIS — E785 Hyperlipidemia, unspecified: Secondary | ICD-10-CM | POA: Diagnosis not present

## 2017-11-29 LAB — CBC WITH DIFFERENTIAL/PLATELET
BASOS ABS: 0 10*3/uL (ref 0.0–0.1)
Basophils Relative: 0 %
Eosinophils Absolute: 0.1 10*3/uL (ref 0.0–0.7)
Eosinophils Relative: 2 %
HCT: 35.1 % — ABNORMAL LOW (ref 36.0–46.0)
HEMOGLOBIN: 12 g/dL (ref 12.0–15.0)
LYMPHS PCT: 29 %
Lymphs Abs: 1.5 10*3/uL (ref 0.7–4.0)
MCH: 30.8 pg (ref 26.0–34.0)
MCHC: 34.2 g/dL (ref 30.0–36.0)
MCV: 90.2 fL (ref 78.0–100.0)
MONO ABS: 0.5 10*3/uL (ref 0.1–1.0)
Monocytes Relative: 10 %
NEUTROS ABS: 3 10*3/uL (ref 1.7–7.7)
Neutrophils Relative %: 59 %
Platelets: 206 10*3/uL (ref 150–400)
RBC: 3.89 MIL/uL (ref 3.87–5.11)
RDW: 13.2 % (ref 11.5–15.5)
WBC: 5 10*3/uL (ref 4.0–10.5)

## 2017-11-29 LAB — BASIC METABOLIC PANEL
ANION GAP: 7 (ref 5–15)
BUN: 12 mg/dL (ref 6–20)
CO2: 26 mmol/L (ref 22–32)
Calcium: 8.6 mg/dL — ABNORMAL LOW (ref 8.9–10.3)
Chloride: 106 mmol/L (ref 101–111)
Creatinine, Ser: 0.82 mg/dL (ref 0.44–1.00)
GFR calc Af Amer: >60 mL/min (ref >60)
GLUCOSE: 171 mg/dL — AB (ref 65–99)
POTASSIUM: 3.3 mmol/L — AB (ref 3.5–5.1)
Sodium: 139 mmol/L (ref 135–145)

## 2017-11-29 MED ORDER — POTASSIUM CHLORIDE CRYS ER 20 MEQ PO TBCR
40.0000 meq | EXTENDED_RELEASE_TABLET | Freq: Once | ORAL | Status: AC
  Administered 2017-11-29: 40 meq via ORAL
  Filled 2017-11-29: qty 2

## 2017-11-29 MED ORDER — METHOCARBAMOL 500 MG PO TABS
500.0000 mg | ORAL_TABLET | Freq: Three times a day (TID) | ORAL | Status: DC | PRN
  Administered 2017-11-29 – 2017-11-30 (×4): 500 mg via ORAL
  Filled 2017-11-29 (×4): qty 1

## 2017-11-29 MED ORDER — DIAZEPAM 5 MG/ML IJ SOLN
5.0000 mg | Freq: Once | INTRAMUSCULAR | Status: DC
  Filled 2017-11-29: qty 2

## 2017-11-29 MED ORDER — METHOCARBAMOL 1000 MG/10ML IJ SOLN
500.0000 mg | Freq: Three times a day (TID) | INTRAVENOUS | Status: DC | PRN
  Filled 2017-11-29: qty 5

## 2017-11-29 NOTE — Progress Notes (Addendum)
PROGRESS NOTE    Deborah Pruitt  ZOX:[id number] DOB: [birth year] DOA: 11/28/2017 PCP: Elenora Gamma, MD    Brief Narrative:  Deborah Pruitt is a 77 y.o. female with history of hypertension hyperlipidemia had a fall at fast food restaurant while trying to get up from the seated position.  Patient states she may have hit her head but did not lose consciousness.  She was finding it difficult to get up from the following position and 3 other people had to help her.  Following which he started developing pain in the left hip.  Denied any chest pain shortness of breath dizziness or palpitations.  ED Course: In the ER x-rays revealed left periprosthetic hip fracture.  Dr. Ranell Patrick on-call orthopedic surgeon was consulted and at this time the requested pain relief and conservative management.  Physical therapy evaluation.  Patient admitted for pain management and possible rehab placement.     Assessment & Plan:   Principal Problem:   Hip fracture, left (HCC) Active Problems:   Hyperlipidemia with target LDL less than 100   Essential hypertension   Femur fracture (HCC)   Hip fracture (HCC)  #1 left periprosthetic hip fracture Secondary to mechanical fall.  Continue current pain regimen.  We will add Robaxin as needed for muscle spasms.  Patient seen in consultation by orthopedics who are currently recommending conservative treatment with PT/OT, pain management.  Up with therapy.  Per orthopedics.  2.  Hypertension Blood pressure stable.  Continue current regimen of Norvasc.  3.  Hyperlipidemia Continue lovaza.  4.  Hypokalemia Replete.   DVT prophylaxis: Lovenox Code Status: Full Family Communication: Updated patient.  No family at bedside. Disposition Plan: Per orthopedics likely this weekend home versus SNIF.   Consultants:   Orthopedics: Dr. Ranell Patrick 11/28/2017  Procedures:   Chest x-ray 11/28/2017  Films of the left femur 11/28/2017  Plain films of the L hip  11/28/2017  Antimicrobials:  None   Subjective: She states as long as she sits still has no pain however when she tries to move has some pain in the left hip.  Patient states was able to ambulate with a little bit with physical therapy today.  No chest pain no shortness of breath.  Patient complaining of some muscle spasms as well with her pain.  Objective: Vitals:   11/28/17 2240 11/29/17 0421 11/29/17 1051 11/29/17 1337  BP: (!) 161/72 (!) 143/59 138/60 (!) 149/65  Pulse: 65 64 63 68  Resp: 16 15 14 15   Temp: 98.2 F (36.8 C) 98.2 F (36.8 C) 98.1 F (36.7 C) 98.6 F (37 C)  TempSrc: Oral Oral Oral Oral  SpO2: 98% 97% 98% 100%  Weight:      Height:        Intake/Output Summary (Last 24 hours) at 11/29/2017 1351 Last data filed at 11/29/2017 1338 Gross per 24 hour  Intake 470 ml  Output 600 ml  Net -130 ml   Filed Weights   11/28/17 1749  Weight: 73.9 kg (163 lb)    Examination:  General exam: Appears calm and comfortable  Respiratory system: Clear to auscultation. Respiratory effort normal. Cardiovascular system: S1 & S2 heard, RRR. No JVD, murmurs, rubs, gallops or clicks. No pedal edema. Gastrointestinal system: Abdomen is nondistended, soft and nontender. No organomegaly or masses felt. Normal bowel sounds heard. Central nervous system: Alert and oriented. No focal neurological deficits. Extremities: Left hip tender to palpation.  Ice pack on left hip.  Skin: No rashes, lesions  or ulcers Psychiatry: Judgement and insight appear normal. Mood & affect appropriate.     Data Reviewed: I have personally reviewed following labs and imaging studies  CBC: Recent Labs  Lab 11/28/17 1747 11/29/17 0917  WBC 5.3 5.0  NEUTROABS 3.2 3.0  HGB 13.2 12.0  HCT 39.1 35.1*  MCV 89.5 90.2  PLT 226 206   Basic Metabolic Panel: Recent Labs  Lab 11/28/17 1747 11/29/17 0917  NA 139 139  K 3.8 3.3*  CL 106 106  CO2 25 26  GLUCOSE 159* 171*  BUN 14 12  CREATININE 0.93  0.82  CALCIUM 9.1 8.6*   GFR: Estimated Creatinine Clearance: 54.1 mL/min (by C-G formula based on SCr of 0.82 mg/dL). Liver Function Tests: Recent Labs  Lab 11/28/17 1747  AST 25  ALT 20  ALKPHOS 69  BILITOT 0.7  PROT 7.2  ALBUMIN 4.0   No results for input(s): LIPASE, AMYLASE in the last 168 hours. No results for input(s): AMMONIA in the last 168 hours. Coagulation Profile: Recent Labs  Lab 11/28/17 1747  INR 0.99   Cardiac Enzymes: No results for input(s): CKTOTAL, CKMB, CKMBINDEX, TROPONINI in the last 168 hours. BNP (last 3 results) No results for input(s): PROBNP in the last 8760 hours. HbA1C: No results for input(s): HGBA1C in the last 72 hours. CBG: No results for input(s): GLUCAP in the last 168 hours. Lipid Profile: No results for input(s): CHOL, HDL, LDLCALC, TRIG, CHOLHDL, LDLDIRECT in the last 72 hours. Thyroid Function Tests: No results for input(s): TSH, T4TOTAL, FREET4, T3FREE, THYROIDAB in the last 72 hours. Anemia Panel: No results for input(s): VITAMINB12, FOLATE, FERRITIN, TIBC, IRON, RETICCTPCT in the last 72 hours. Sepsis Labs: No results for input(s): PROCALCITON, LATICACIDVEN in the last 168 hours.  No results found for this or any previous visit (from the past 240 hour(s)).       Radiology Studies: Dg Chest 2 View  Result Date: 11/28/2017 CLINICAL DATA:  Recent fall with findings suspicious of proximal left femoral fracture EXAM: CHEST - 2 VIEW COMPARISON:  06/12/2015 FINDINGS: Cardiac shadow is enlarged but accentuated by the frontal technique. The lungs are clear bilaterally. No sizable effusion is seen. Mild degenerative changes of the thoracic spine are noted. IMPRESSION: No acute abnormality noted. Electronically Signed   By: Alcide CleverMark  Lukens M.D.   On: 11/28/2017 18:48   Dg Hip Unilat With Pelvis 2-3 Views Left  Result Date: 11/28/2017 CLINICAL DATA:  Left hip pain following fall, initial encounter EXAM: DG HIP (WITH OR WITHOUT PELVIS)  2-3V LEFT COMPARISON:  None. FINDINGS: The pelvic ring appears intact. Left hip prosthesis is noted in satisfactory position. Mild cortical irregularity is noted along the proximal femur just below the greater trochanter suspicious for an underlying undisplaced fracture. IMPRESSION: Changes suspicious for an undisplaced fracture just below the trochanteric region in the proximal left femur. Electronically Signed   By: Alcide CleverMark  Lukens M.D.   On: 11/28/2017 18:45   Dg Femur Min 2 Views Left  Result Date: 11/28/2017 CLINICAL DATA:  Recent fall with left leg pain, initial encounter EXAM: LEFT FEMUR 2 VIEWS COMPARISON:  None. FINDINGS: Left hip prosthesis is noted. Better visualized on the femur film is a cortical irregularity just below the intratrochanteric region suspicious for undisplaced fracture. No other focal abnormality is noted. Degenerative changes about the knee joint are seen. IMPRESSION: Cortical irregularity just below the intratrochanteric region suspicious for undisplaced proximal femoral fracture. Electronically Signed   By: Eulah PontMark  Lukens M.D.  On: 11/28/2017 18:48        Scheduled Meds: . acidophilus  2 capsule Oral Daily  . amLODipine  10 mg Oral Daily  . cholecalciferol  2,000 Units Oral Daily  . cycloSPORINE  1 drop Both Eyes BID  . diazepam  5 mg Intravenous Once  . enoxaparin (LOVENOX) injection  40 mg Subcutaneous QHS  . multivitamin with minerals  2 tablet Oral Daily  . omega-3 acid ethyl esters  2 capsule Oral Daily  . pantoprazole  40 mg Oral Daily  . potassium chloride  40 mEq Oral Once   Continuous Infusions:   LOS: 0 days    Time spent: 35 mins    Ramiro Harvest, MD [facility] Pager [phone]  If 7PM-7AM, please contact night-coverage [email or web address] Password [email or web address] 11/29/2017, 1:51 PM

## 2017-11-29 NOTE — Evaluation (Signed)
Physical Therapy Evaluation Patient Details Name: Deborah Pruitt MRN: [id number] DOB: [birth year] Today's Date: 11/29/2017   History of Present Illness  77 y.o. female with history of L THA in 1999 had a fall at fast food restaurant and sustained left nondisplaced periprosthetic hip fracture; plan for conservative management.   Clinical Impression  Pt admitted with above diagnosis. Pt currently with functional limitations due to the deficits listed below (see PT Problem List).  Pt will benefit from skilled PT to increase their independence and safety with mobility to allow discharge to the venue listed below.  Pt educated on WB status and able to maintain well.  Pt only able to tolerate short distance.  Pt with multiple levels at home however can remain on main level with one step to enter.  Will continue to assist pt with safe mobility during acute stay.     Follow Up Recommendations No PT follow up(pending f/u visit with ortho)    Equipment Recommendations  None recommended by PT    Recommendations for Other Services       Precautions / Restrictions Precautions Precautions: Fall Restrictions Weight Bearing Restrictions: Yes LLE Weight Bearing: Touchdown weight bearing      Mobility  Bed Mobility Overal bed mobility: Needs Assistance Bed Mobility: Supine to Sit     Supine to sit: Min assist     General bed mobility comments: verbal cues for technique, bringing LEs over EOB together for better pain control, pt initiated and able to scoot hips to side of bed but required assist for bringing LEs over EOB for better pain control  Transfers Overall transfer level: Needs assistance Equipment used: Rolling walker (2 wheeled) Transfers: Sit to/from Stand Sit to Stand: Min guard         General transfer comment: verbal cues for safe technique, maintains TDWB well  Ambulation/Gait Ambulation/Gait assistance: Min guard Ambulation Distance (Feet): 16 Feet Assistive device:  Rolling walker (2 wheeled) Gait Pattern/deviations: Step-to pattern     General Gait Details: pt mostly maintained NWB status, cues for RW positioning, distance to tolerance  Stairs            Wheelchair Mobility    Modified Rankin (Stroke Patients Only)       Balance Overall balance assessment: History of Falls                                           Pertinent Vitals/Pain Pain Assessment: 0-10 Pain Score: 4  Pain Location: L hip Pain Descriptors / Indicators: Sore Pain Intervention(s): Limited activity within patient's tolerance;Monitored during session;Repositioned;Ice applied    Home Living Family/patient expects to be discharged to:: Private residence Living Arrangements: Spouse/significant other Available Help at Discharge: Family Type of Home: House Home Access: Stairs to enter Entrance Stairs-Rails: None Entrance Stairs-Number of Steps: 1 Home Layout: Able to live on main level with bedroom/bathroom;Multi-level Home Equipment: Walker - 2 wheels;Bedside commode      Prior Function Level of Independence: Independent               Hand Dominance        Extremity/Trunk Assessment        Lower Extremity Assessment Lower Extremity Assessment: LLE deficits/detail LLE Deficits / Details: requires assist for mobility due to pain, assisted with gentle movement       Communication   Communication: No difficulties  Cognition Arousal/Alertness:  Awake/alert Behavior During Therapy: WFL for tasks assessed/performed Overall Cognitive Status: Within Functional Limits for tasks assessed                                        General Comments      Exercises     Assessment/Plan    PT Assessment Patient needs continued PT services  PT Problem List Decreased strength;Decreased mobility;Decreased balance;Decreased knowledge of use of DME;Pain;Decreased knowledge of precautions;Decreased activity tolerance        PT Treatment Interventions Gait training;DME instruction;Therapeutic activities;Therapeutic exercise;Patient/family education;Functional mobility training;Stair training    PT Goals (Current goals can be found in the Care Plan section)  Acute Rehab PT Goals PT Goal Formulation: With patient Time For Goal Achievement: 12/06/17 Potential to Achieve Goals: Good    Frequency Min 5X/week   Barriers to discharge        Co-evaluation               AM-PAC PT "6 Clicks" Daily Activity  Outcome Measure Difficulty turning over in bed (including adjusting bedclothes, sheets and blankets)?: A Lot Difficulty moving from lying on back to sitting on the side of the bed? : Unable Difficulty sitting down on and standing up from a chair with arms (e.g., wheelchair, bedside commode, etc,.)?: Unable Help needed moving to and from a bed to chair (including a wheelchair)?: A Little Help needed walking in hospital room?: A Little Help needed climbing 3-5 steps with a railing? : A Lot 6 Click Score: 12    End of Session Equipment Utilized During Treatment: Gait belt Activity Tolerance: Patient tolerated treatment well Patient left: in chair;with call bell/phone within reach;with family/visitor present Nurse Communication: Mobility status PT Visit Diagnosis: Difficulty in walking, not elsewhere classified (R26.2);Pain Pain - Right/Left: Left Pain - part of body: Hip    Time: [id number] PT Time Calculation (min) (ACUTE ONLY): 21 min   Charges:   PT Evaluation $PT Eval Low Complexity: 1 Low     PT G Codes:       Zenovia JarredKati Payzlee Ryder, PT, DPT 11/29/2017 Pager: [phone]  Maida SaleLEMYRE,KATHrine E 11/29/2017, 12:16 PM

## 2017-11-29 NOTE — Progress Notes (Signed)
Brief Pharmacy note:  Lovenox for VTE prophylaxis protocol  Lovenox already ordered 3/6 at appropriate dose, no protocol necessary, will sign off protocol,  Thank you,  Otho BellowsGreen, Nura Cahoon L PharmD Pager [phone] 11/29/2017, 8:39 AM

## 2017-11-29 NOTE — Progress Notes (Signed)
Subjective:    Patient reports pain as 2 on 0-10 scale. Xrays revied and FX is non-displaced. Should be ready for Dc by the weekend.   Objective: Vital signs in last 24 hours: Temp:  [98.2 F (36.8 C)] 98.2 F (36.8 C) (03/07 0421) Pulse Rate:  [62-81] 64 (03/07 0421) Resp:  [14-21] 15 (03/07 0421) BP: (128-161)/(59-73) 143/59 (03/07 0421) SpO2:  [94 %-98 %] 97 % (03/07 0421) Weight:  [73.9 kg (163 lb)] 73.9 kg (163 lb) (03/06 1749)  Intake/Output from previous day: 03/06 0701 - 03/07 0700 In: 230 [P.O.:230] Out: 350 [Urine:350] Intake/Output this shift: No intake/output data recorded.  Recent Labs    11/28/17 1747  HGB 13.2   Recent Labs    11/28/17 1747  WBC 5.3  RBC 4.37  HCT 39.1  PLT 226   Recent Labs    11/28/17 1747  NA 139  K 3.8  CL 106  CO2 25  BUN 14  CREATININE 0.93  GLUCOSE 159*  CALCIUM 9.1   Recent Labs    11/28/17 1747  INR 0.99    Neurologically intact  Assessment/Plan:    Up with therapy  Deborah Pruitt 11/29/2017, 7:33 AM

## 2017-11-29 NOTE — Progress Notes (Addendum)
Brief Nutrition Note:    RD consult for hip fracture protocol.     Spoke with patient who reports great appetite. She is currently on Heart DIET and is consuming 100% of meals.  Pt reports her weight has been stable. Medical record also shows stable weight status for past 4 years.  Wt Readings from Last 10 Encounters:  11/28/17 163 lb (73.9 kg)  08/23/17 163 lb (73.9 kg)  11/01/16 162 lb (73.5 kg)  05/12/16 162 lb 6 oz (73.7 kg)  06/11/15 156 lb (70.8 kg)  05/12/15 158 lb 6.4 oz (71.8 kg)  09/30/14 157 lb (71.2 kg)  08/26/14 158 lb 8 oz (71.9 kg)  08/17/14 157 lb 12.8 oz (71.6 kg)  07/28/14 157 lb 8 oz (71.4 kg)     Lives independently at home where she prepares food for herself and her husband. She is also active with volunteering, church and exercises 3x/wk at [employer].  Pt reports that she takes supplements for bone health at home (e.g. Vit D, Ca). She tries to eat well at home (including lean proteins, fresh fruits and vegetables). Encouraged pt to continue with nutrition/supplements to promote bone health.   No further nutrition interventions warranted at this time. Please consult RD if future nutrition needs arise.    Marjie Skiffherie N. Orie [place] Dietetic Intern [phone]    .

## 2017-11-29 NOTE — Evaluation (Addendum)
Occupational Therapy Evaluation Patient Details Name: Deborah Pruitt MRN: [id number] DOB: 1941-01-11 Today's Date: 11/29/2017    History of Present Illness 77 y.o. female with history of L THA in 1999 had a fall at fast food restaurant and sustained left nondisplaced periprosthetic hip fracture; plan for conservative management.    Clinical Impression   Pt admitted with fall with hip fx. Pt currently with functional limitations due to the deficits listed below (see OT Problem List).  Pt will benefit from skilled OT to increase their safety and independence with ADL and functional mobility for ADL to facilitate discharge to venue listed below.      Follow Up Recommendations  24/7 A   Equipment Recommendations  Tub/shower bench       Precautions / Restrictions Precautions Precautions: Fall Restrictions Weight Bearing Restrictions: Yes LLE Weight Bearing: Touchdown weight bearing      Mobility Bed Mobility Overal bed mobility: Needs Assistance Bed Mobility: Supine to Sit     Supine to sit: Min assist     General bed mobility comments: pt in chair  Transfers Overall transfer level: Needs assistance Equipment used: Rolling walker (2 wheeled) Transfers: Sit to/from [employer] Sit to Stand: Min assist Stand pivot transfers: Min assist       General transfer comment: verbal cues for safe technique, maintains TDWB well    Balance Overall balance assessment: History of Falls                                         ADL either performed or assessed with clinical judgement   ADL Overall ADL's : Needs assistance/impaired Eating/Feeding: Set up;Sitting   Grooming: Set up;Sitting   Upper Body Bathing: Set up;Sitting   Lower Body Bathing: Moderate assistance;Sit to/from stand;Cueing for safety;Cueing for sequencing;Cueing for compensatory techniques   Upper Body Dressing : Set up;Sitting   Lower Body Dressing: Moderate  assistance;Cueing for safety;Cueing for sequencing;Sit to/from stand;Cueing for compensatory techniques   Toilet Transfer: Minimal assistance;Ambulation;RW;Comfort height toilet   Toileting- Clothing Manipulation and Hygiene: Minimal assistance;Sit to/from stand;Cueing for safety;Cueing for sequencing         General ADL Comments: cues for TDWB with sit to stand and stand to sit     Vision Patient Visual Report: No change from baseline              Pertinent Vitals/Pain Pain Assessment: 0-10 Pain Score: 3  Pain Location: L hip Pain Descriptors / Indicators: Sore Pain Intervention(s): Limited activity within patient's tolerance     Hand Dominance     Extremity/Trunk Assessment Upper Extremity Assessment Upper Extremity Assessment: Overall WFL for tasks assessed   Lower Extremity Assessment Lower Extremity Assessment: LLE deficits/detail LLE Deficits / Details: requires assist for mobility due to pain, assisted with gentle movement       Communication Communication Communication: No difficulties   Cognition Arousal/Alertness: Awake/alert Behavior During Therapy: WFL for tasks assessed/performed Overall Cognitive Status: Within Functional Limits for tasks assessed                                     General Comments   Husband will A as needed.  Pt will obtain tub bench as well as new AE/  Pt had this from the past but needs new.  Home Living Family/patient expects to be discharged to:: Private residence Living Arrangements: Spouse/significant other Available Help at Discharge: Family Type of Home: House Home Access: Stairs to enter Secretary/administratorntrance Stairs-Number of Steps: 1 Entrance Stairs-Rails: None Home Layout: Able to live on main level with bedroom/bathroom;Multi-level     Bathroom Shower/Tub: Tub/shower unit         Home Equipment: Environmental consultantWalker - 2 wheels;Bedside commode   Additional Comments: pt will obtain a tub bench      Prior  Functioning/Environment Level of Independence: Independent                 OT Problem List: Decreased strength;Decreased activity tolerance;Impaired balance (sitting and/or standing);Pain      OT Treatment/Interventions: Self-care/ADL training;Patient/family education;DME and/or AE instruction    OT Goals(Current goals can be found in the care plan section) Acute Rehab OT Goals Patient Stated Goal: home soon OT Goal Formulation: With patient Time For Goal Achievement: 12/06/17 Potential to Achieve Goals: Good  OT Frequency: Min 2X/week              AM-PAC PT "6 Clicks" Daily Activity     Outcome Measure Help from another person eating meals?: None Help from another person taking care of personal grooming?: A Little Help from another person toileting, which includes using toliet, bedpan, or urinal?: A Little Help from another person bathing (including washing, rinsing, drying)?: A Little Help from another person to put on and taking off regular upper body clothing?: None Help from another person to put on and taking off regular lower body clothing?: A Lot 6 Click Score: 19   End of Session Equipment Utilized During Treatment: Rolling walker Nurse Communication: Mobility status  Activity Tolerance: Patient tolerated treatment well Patient left: in chair  OT Visit Diagnosis: Unsteadiness on feet (R26.81);Repeated falls (R29.6);Muscle weakness (generalized) (M62.81);History of falling (Z91.81)                Time: [id number] OT Time Calculation (min): 14 min Charges:  OT General Charges $OT Visit: 1 Visit OT Evaluation $OT Eval Low Complexity: 1 Low G-Codes:     Lise AuerLori Aldeen [place], [place] [phone]  Einar CrowEDDING, Rachael Zapanta D 11/29/2017, 12:39 PM

## 2017-11-29 NOTE — Progress Notes (Signed)
CSW consult-SNF  No PT follow up/(pending f/u visit with ortho) CSW signing off.   Vivi BarrackNicole Olanrewaju Osborn, Theresia MajorsLCSWA, MSW Clinical Social Worker  [phone] 11/29/2017  2:44 PM

## 2017-11-30 DIAGNOSIS — I10 Essential (primary) hypertension: Secondary | ICD-10-CM | POA: Diagnosis not present

## 2017-11-30 DIAGNOSIS — E876 Hypokalemia: Secondary | ICD-10-CM

## 2017-11-30 DIAGNOSIS — E785 Hyperlipidemia, unspecified: Secondary | ICD-10-CM | POA: Diagnosis not present

## 2017-11-30 DIAGNOSIS — S72002G Fracture of unspecified part of neck of left femur, subsequent encounter for closed fracture with delayed healing: Secondary | ICD-10-CM | POA: Diagnosis not present

## 2017-11-30 DIAGNOSIS — S72392A Other fracture of shaft of left femur, initial encounter for closed fracture: Secondary | ICD-10-CM | POA: Diagnosis not present

## 2017-11-30 DIAGNOSIS — S7290XA Unspecified fracture of unspecified femur, initial encounter for closed fracture: Secondary | ICD-10-CM | POA: Diagnosis not present

## 2017-11-30 DIAGNOSIS — W19XXXA Unspecified fall, initial encounter: Secondary | ICD-10-CM | POA: Diagnosis not present

## 2017-11-30 LAB — CBC
HEMATOCRIT: 36.3 % (ref 36.0–46.0)
HEMOGLOBIN: 12.2 g/dL (ref 12.0–15.0)
MCH: 30.7 pg (ref 26.0–34.0)
MCHC: 33.6 g/dL (ref 30.0–36.0)
MCV: 91.2 fL (ref 78.0–100.0)
Platelets: 201 10*3/uL (ref 150–400)
RBC: 3.98 MIL/uL (ref 3.87–5.11)
RDW: 13.2 % (ref 11.5–15.5)
WBC: 5 10*3/uL (ref 4.0–10.5)

## 2017-11-30 LAB — BASIC METABOLIC PANEL
Anion gap: 7 (ref 5–15)
BUN: 13 mg/dL (ref 6–20)
CHLORIDE: 107 mmol/L (ref 101–111)
CO2: 25 mmol/L (ref 22–32)
CREATININE: 0.85 mg/dL (ref 0.44–1.00)
Calcium: 8.8 mg/dL — ABNORMAL LOW (ref 8.9–10.3)
GFR calc Af Amer: >60 mL/min (ref >60)
GFR calc non Af Amer: >60 mL/min (ref >60)
Glucose, Bld: 105 mg/dL — ABNORMAL HIGH (ref 65–99)
Potassium: 4.1 mmol/L (ref 3.5–5.1)
Sodium: 139 mmol/L (ref 135–145)

## 2017-11-30 MED ORDER — METHOCARBAMOL 500 MG PO TABS: 500.0000 mg | ORAL_TABLET | Freq: Three times a day (TID) | ORAL | 0 refills | Status: DC | PRN

## 2017-11-30 MED ORDER — ASPIRIN EC 325 MG PO TBEC: 325.0000 mg | DELAYED_RELEASE_TABLET | Freq: Two times a day (BID) | ORAL | 0 refills | Status: DC

## 2017-11-30 MED ORDER — ENOXAPARIN SODIUM 40 MG/0.4ML ~~LOC~~ SOLN: 40.0000 mg | Freq: Every day | SUBCUTANEOUS | 0 refills | Status: DC

## 2017-11-30 MED ORDER — HYDROCODONE-ACETAMINOPHEN 5-325 MG PO TABS: 1.0000 | ORAL_TABLET | Freq: Four times a day (QID) | ORAL | 0 refills | Status: DC | PRN

## 2017-11-30 MED ORDER — ASPIRIN EC 325 MG PO TBEC
325.0000 mg | DELAYED_RELEASE_TABLET | Freq: Two times a day (BID) | ORAL | Status: DC
  Administered 2017-11-30: 10:00:00 325 mg via ORAL
  Filled 2017-11-30: qty 1

## 2017-11-30 NOTE — Discharge Summary (Signed)
Physician Discharge Summary  Warren Lacyauline C Haacke ZOX:[id number] DOB: 08/18/1941 DOA: 11/28/2017  PCP: Elenora GammaBradshaw, Samuel L, MD  Admit date: 11/28/2017 Discharge date: 11/30/2017  Time spent: 45 minutes  Recommendations for Outpatient Follow-up:  1. Follow-up with Dr. Darrelyn HillockGioffre, orthopedics in 1 week on 12/11/2017.   Discharge Diagnoses:  Principal Problem:   Hip fracture, left (HCC) Active Problems:   Hyperlipidemia with target LDL less than 100   Essential hypertension   Femur fracture (HCC)   Hip fracture (HCC)   Hypokalemia   Discharge Condition: Stable and improved  Diet recommendation: regular  Filed Weights   11/28/17 1749  Weight: 73.9 kg (163 lb)    History of present illness:  Per Dr Gorden HarmsKakrakandy Deborah Pruitt is a 77 y.o. female with history of hypertension hyperlipidemia had a fall at fast food restaurant while trying to get up from the seated position.  Patient stated she may have hit her head but did not lose consciousness.  She was finding it difficult to get up from the following position and 3 other people had to help her.  Following which she started developing pain in the left hip.  Denied any chest pain shortness of breath dizziness or palpitations.  ED Course: In the ER x-rays revealed left periprosthetic hip fracture.  Dr. Ranell PatrickNorris on-call orthopedic surgeon was consulted and at this time they requested pain relief and conservative management.  Physical therapy evaluation.  Patient admitted for pain management and possible rehab placement.     Hospital Course:  #1 left periprosthetic hip fracture Secondary to mechanical fall.  Patient was placed on pain management, Robaxin added to regimen for muscle spasms. Patient seen in consultation by orthopedics who recommended conservative treatment with PT/OT, pain management.  Patient was seen by physical therapy during the hospitalization.  Patient improved clinically and on a daily basis.  Pain was managed.  Patient  be discharged home in stable and improved condition and is to follow-up with orthopedics in the outpatient setting in 1 week.  Patient will be discharged in stable and improved condition.   2.  Hypertension Blood pressure remained stable on home regimen of Norvasc.  3.  Hyperlipidemia Continued on home regimen of lovaza.  4.  Hypokalemia Repleted.    Procedures:  Chest x-ray 11/28/2017  Films of the left femur 11/28/2017  Plain films of the L hip 11/28/2017      Consultations:  Orthopedics: Dr. Ranell PatrickNorris 11/28/2017      Discharge Exam: Vitals:   11/30/17 0917 11/30/17 0953  BP: (!) 143/70 138/75  Pulse: 73   Resp: 17   Temp: 98.3 F (36.8 C)   SpO2: 97%     General: nad Cardiovascular: RRR Respiratory: CTAB  Discharge Instructions   Discharge Instructions    Diet general   Complete by:  As directed    Increase activity slowly   Complete by:  As directed    Touch down weight bearing   Complete by:  As directed    Laterality:  left   Extremity:  Lower     Allergies as of 11/30/2017      Reactions   Milk-related Compounds Other (See Comments)   "I almost died"   Ciprofloxacin Hives, Swelling   Dexilant [dexlansoprazole] Hives, Swelling   Penicillins Other (See Comments)   Childhood allergy.   Vioxx [rofecoxib] Hives, Swelling      Medication List    TAKE these medications   acidophilus Caps capsule Take 2 capsules by mouth daily.  amLODipine 10 MG tablet Commonly known as:  NORVASC TAKE 1 TABLET (10 MG TOTAL) BY MOUTH DAILY.   aspirin EC 325 MG tablet Take 1 tablet (325 mg total) by mouth 2 (two) times daily.   famotidine 20 MG tablet Commonly known as:  PEPCID TAKE 1 TABLET (20 MG TOTAL) BY MOUTH 2 (TWO) TIMES DAILY.   HYDROcodone-acetaminophen 5-325 MG tablet Commonly known as:  NORCO/VICODIN Take 1-2 tablets by mouth every 6 (six) hours as needed for moderate pain.   methocarbamol 500 MG tablet Commonly known as:  ROBAXIN Take 1  tablet (500 mg total) by mouth every 8 (eight) hours as needed for muscle spasms.   multivitamin with minerals Tabs tablet Take 2 tablets by mouth daily.   omega-3 acid ethyl esters 1 g capsule Commonly known as:  LOVAZA Take 2 capsules (2 g total) by mouth 2 (two) times daily. What changed:  when to take this   pantoprazole 40 MG tablet Commonly known as:  PROTONIX TAKE 1 TABLET (40 MG TOTAL) BY MOUTH DAILY.   RESTASIS MULTIDOSE 0.05 % ophthalmic emulsion Generic drug:  cycloSPORINE Place 1 drop into both eyes 2 (two) times daily.   VITAMIN B 12 PO Take 1 tablet by mouth daily.   Vitamin D3 2000 units Tabs Take 1 tablet by mouth daily.            Durable Medical Equipment  (From admission, onward)        Start     Ordered   11/30/17 1157  For home use only DME lightweight manual wheelchair with seat cushion  Once    Comments:  Patient suffers from left periprosthetic hip fracture which impairs their ability to perform daily activities like in the home.  A walking aid will not resolve  issue with performing activities of daily living. A wheelchair will allow patient to safely perform daily activities. Patient is not able to propel themselves in the home using a standard weight wheelchair due to weakness. Patient can self propel in the lightweight wheelchair.  Accessories: elevating leg rests (ELRs), wheel locks, extensions and anti-tippers.   11/30/17 1157   11/30/17 1155  For home use only DME Tub bench  Once     11/30/17 1154   11/29/17 1327  For home use only DME Tub bench  Once     11/29/17 1326       Discharge Care Instructions  (From admission, onward)        Start     Ordered   11/30/17 0000  Touch down weight bearing    Question Answer Comment  Laterality left   Extremity Lower      11/30/17 0655     Allergies  Allergen Reactions  . Milk-Related Compounds Other (See Comments)    "I almost died"  . Ciprofloxacin Hives and Swelling  . Dexilant  [Dexlansoprazole] Hives and Swelling  . Penicillins Other (See Comments)    Childhood allergy.  . Vioxx [Rofecoxib] Hives and Swelling   Follow-up Information    Ranee Gosselin, MD Follow up on 12/11/2017.   Specialty:  Orthopedic Surgery Contact information: [address] [place] [address] [phone]            The results of significant diagnostics from this hospitalization (including imaging, microbiology, ancillary and laboratory) are listed below for reference.    Significant Diagnostic Studies: Dg Chest 2 View  Result Date: 11/28/2017 CLINICAL DATA:  Recent fall with findings suspicious of proximal left  femoral fracture EXAM: CHEST - 2 VIEW COMPARISON:  06/12/2015 FINDINGS: Cardiac shadow is enlarged but accentuated by the frontal technique. The lungs are clear bilaterally. No sizable effusion is seen. Mild degenerative changes of the thoracic spine are noted. IMPRESSION: No acute abnormality noted. Electronically Signed   By: Alcide Clever M.D.   On: 11/28/2017 18:48   Dg Hip Unilat With Pelvis 2-3 Views Left  Result Date: 11/28/2017 CLINICAL DATA:  Left hip pain following fall, initial encounter EXAM: DG HIP (WITH OR WITHOUT PELVIS) 2-3V LEFT COMPARISON:  None. FINDINGS: The pelvic ring appears intact. Left hip prosthesis is noted in satisfactory position. Mild cortical irregularity is noted along the proximal femur just below the greater trochanter suspicious for an underlying undisplaced fracture. IMPRESSION: Changes suspicious for an undisplaced fracture just below the trochanteric region in the proximal left femur. Electronically Signed   By: Alcide Clever M.D.   On: 11/28/2017 18:45   Dg Femur Min 2 Views Left  Result Date: 11/28/2017 CLINICAL DATA:  Recent fall with left leg pain, initial encounter EXAM: LEFT FEMUR 2 VIEWS COMPARISON:  None. FINDINGS: Left hip prosthesis is noted. Better visualized on the femur film is a cortical irregularity just below  the intratrochanteric region suspicious for undisplaced fracture. No other focal abnormality is noted. Degenerative changes about the knee joint are seen. IMPRESSION: Cortical irregularity just below the intratrochanteric region suspicious for undisplaced proximal femoral fracture. Electronically Signed   By: Alcide Clever M.D.   On: 11/28/2017 18:48    Microbiology: No results found for this or any previous visit (from the past 240 hour(s)).   Labs: Basic Metabolic Panel: Recent Labs  Lab 11/28/17 1747 11/29/17 0917 11/30/17 0607  NA 139 139 139  K 3.8 3.3* 4.1  CL 106 106 107  CO2 25 26 25   GLUCOSE 159* 171* 105*  BUN 14 12 13   CREATININE 0.93 0.82 0.85  CALCIUM 9.1 8.6* 8.8*   Liver Function Tests: Recent Labs  Lab 11/28/17 1747  AST 25  ALT 20  ALKPHOS 69  BILITOT 0.7  PROT 7.2  ALBUMIN 4.0   No results for input(s): LIPASE, AMYLASE in the last 168 hours. No results for input(s): AMMONIA in the last 168 hours. CBC: Recent Labs  Lab 11/28/17 1747 11/29/17 0917 11/30/17 0607  WBC 5.3 5.0 5.0  NEUTROABS 3.2 3.0  --   HGB 13.2 12.0 12.2  HCT 39.1 35.1* 36.3  MCV 89.5 90.2 91.2  PLT 226 206 201   Cardiac Enzymes: No results for input(s): CKTOTAL, CKMB, CKMBINDEX, TROPONINI in the last 168 hours. BNP: BNP (last 3 results) No results for input(s): BNP in the last 8760 hours.  ProBNP (last 3 results) No results for input(s): PROBNP in the last 8760 hours.  CBG: No results for input(s): GLUCAP in the last 168 hours.     Signed:  Ramiro Harvest MD.  [facility] 11/30/2017, 12:25 PM

## 2017-11-30 NOTE — Progress Notes (Signed)
Subjective:    Patient reports pain as 2 on 0-10 scale.Ready for DC from Ortho standpoint. Instructions given. Will DC on Aspirin and see in office in one week.    Objective: Vital signs in last 24 hours: Temp:  [98.1 F (36.7 C)-98.6 F (37 C)] 98.6 F (37 C) (03/08 0447) Pulse Rate:  [63-68] 66 (03/08 0447) Resp:  [14-18] 18 (03/08 0447) BP: (116-149)/(53-72) 141/72 (03/08 0447) SpO2:  [96 %-100 %] 96 % (03/08 0447)  Intake/Output from previous day: 03/07 0701 - 03/08 0700 In: 460 [P.O.:460] Out: 550 [Urine:550] Intake/Output this shift: No intake/output data recorded.  Recent Labs    11/28/17 1747 11/29/17 0917 11/30/17 0607  HGB 13.2 12.0 12.2   Recent Labs    11/29/17 0917 11/30/17 0607  WBC 5.0 5.0  RBC 3.89 3.98  HCT 35.1* 36.3  PLT 206 201   Recent Labs    11/29/17 0917 11/30/17 0607  NA 139 139  K 3.3* 4.1  CL 106 107  CO2 26 25  BUN 12 13  CREATININE 0.82 0.85  GLUCOSE 171* 105*  CALCIUM 8.6* 8.8*   Recent Labs    11/28/17 1747  INR 0.99    Dorsiflexion/Plantar flexion intact  Assessment/Plan:    Up with therapy  Ranee Gosselinonald Brenson Hartman 11/30/2017, 7:27 AM

## 2017-11-30 NOTE — Progress Notes (Signed)
Physical Therapy Treatment Patient Details Name: Deborah Pruitt MRN: [id number] DOB: 10/17/1940 Today's Date: 11/30/2017    History of Present Illness 77 y.o. female with history of L THA in 1999 had a fall at fast food restaurant and sustained left nondisplaced periprosthetic hip fracture; plan for conservative management.     PT Comments    Pt ambulated short distance to one step to practice safe technique and then back to recliner.  Pt maintains TDWB status well however fatigues quickly.  Pt would benefit from W/C for longer distance which pt reports she does have in her home.  Pt also taking exercise classes prior to admission and wanted to maintain exercising so provided general HEP handout and reviewed verbally with pt.  Pt aware to perform gentle movement only, no resistance or weights, and not to go into too much pain.  Also reviewed definition of TDWB status, and pt reports understanding.  Pt hopeful to d/c home later today.       Follow Up Recommendations  No PT follow up(pending f/u visit with ortho MD)     Equipment Recommendations  Wheelchair (measurements PT);Wheelchair cushion (measurements PT)  Patient suffers from a left nondisplaced periprosthetic hip fracture which impairs their ability to perform daily activities like ambulating to bathroom in the home.  A walker alone will not resolve the issues with performing activities of daily living. A wheelchair will allow patient to safely perform daily activities.  The patient can self propel in the home or has a caregiver who can provide assistance.    Recommendations for Other Services       Precautions / Restrictions Precautions Precautions: Fall Restrictions Weight Bearing Restrictions: Yes LLE Weight Bearing: Touchdown weight bearing    Mobility  Bed Mobility               General bed mobility comments: pt in chair upon arrival  Transfers Overall transfer level: Needs assistance Equipment used:  Rolling walker (2 wheeled) Transfers: Sit to/from Stand Sit to Stand: Min guard         General transfer comment: maintains TDWB well, cues for controlling descent  Ambulation/Gait Ambulation/Gait assistance: Min guard Ambulation Distance (Feet): 20 Feet Assistive device: Rolling walker (2 wheeled) Gait Pattern/deviations: Step-to pattern     General Gait Details: pt demonstrates TDWB status, cues for RW positioning, upper body fatigues quickly so only ambulated to step and back to recliner   Stairs Stairs: Yes   Stair Management: Step to pattern;Backwards;With walker Number of Stairs: 1 General stair comments: pt recalls backwards technique with RW from previous surgeries, reviewed safe technique, pt required steadying assist first attempt and min/guard on second performance; reports understanding  Wheelchair Mobility    Modified Rankin (Stroke Patients Only)       Balance Overall balance assessment: Needs assistance Sitting-balance support: No upper extremity supported;Feet supported Sitting balance-Leahy Scale: Good     Standing balance support: Single extremity supported;During functional activity Standing balance-Leahy Scale: Fair Standing balance comment: pulling up underwear in standing                            Cognition Arousal/Alertness: Awake/alert Behavior During Therapy: WFL for tasks assessed/performed Overall Cognitive Status: Within Functional Limits for tasks assessed  Exercises      General Comments        Pertinent Vitals/Pain Pain Assessment: 0-10 Pain Score: 3  Pain Location: L hip Pain Descriptors / Indicators: Dull;Aching Pain Intervention(s): Limited activity within patient's tolerance;Monitored during session;Ice applied    Home Living                      Prior Function            PT Goals (current goals can now be found in the care plan section)  Progress towards PT goals: Progressing toward goals    Frequency    Min 5X/week      PT Plan Equipment recommendations need to be updated    Co-evaluation              AM-PAC PT "6 Clicks" Daily Activity  Outcome Measure  Difficulty turning over in bed (including adjusting bedclothes, sheets and blankets)?: A Lot Difficulty moving from lying on back to sitting on the side of the bed? : A Lot Difficulty sitting down on and standing up from a chair with arms (e.g., wheelchair, bedside commode, etc,.)?: Unable Help needed moving to and from a bed to chair (including a wheelchair)?: A Little Help needed walking in hospital room?: A Little Help needed climbing 3-5 steps with a railing? : A Little 6 Click Score: 14    End of Session Equipment Utilized During Treatment: Gait belt Activity Tolerance: Patient tolerated treatment well Patient left: in chair;with call bell/phone within reach;with family/visitor present Nurse Communication: Mobility status PT Visit Diagnosis: Difficulty in walking, not elsewhere classified (R26.2);Pain Pain - Right/Left: Left Pain - part of body: Hip     Time: 1610-9604 PT Time Calculation (min) (ACUTE ONLY): 19 min  Charges:  $Gait Training: 8-22 mins                    G Codes:       Deborah Pruitt, PT, DPT 11/30/2017 Pager: [phone]  Deborah Pruitt E 11/30/2017, 12:25 PM

## 2017-11-30 NOTE — Discharge Planning (Signed)
Patient IV removed.  RN assessment and VS revealed stability for DC to home. Discharge papers given, explained and educated.  Informed of suggested FU appt and appt made.  Scripts printed and signed.  WC delivered to room prior to DC and shower transfer chair being delivered to home since not available at DC.  Patient wheeled to front and family transporting home via car.

## 2017-11-30 NOTE — Progress Notes (Signed)
    Durable Medical Equipment  (From admission, onward)        Start     Ordered   11/30/17 1157  For home use only DME lightweight manual wheelchair with seat cushion  Once    Comments:  Patient suffers from left periprosthetic hip fracture which impairs their ability to perform daily activities like in the home.  A walking aid will not resolve  issue with performing activities of daily living. A wheelchair will allow patient to safely perform daily activities. Patient is not able to propel themselves in the home using a standard weight wheelchair due to weakness. Patient can self propel in the lightweight wheelchair.  Accessories: elevating leg rests (ELRs), wheel locks, extensions and anti-tippers.   11/30/17 1157   11/30/17 1155  For home use only DME Tub bench  Once     11/30/17 1154   11/29/17 1327  For home use only DME Tub bench  Once     11/29/17 1326

## 2017-11-30 NOTE — Progress Notes (Signed)
Occupational Therapy Treatment and Discharge Patient Details Name: Deborah Pruitt MRN: [id number] DOB: 06-Jan-1941 Today's Date: 11/30/2017    History of present illness 77 y.o. female with history of L THA in 1999 had a fall at fast food restaurant and sustained left nondisplaced periprosthetic hip fracture; plan for conservative management.    OT comments  This 77 yo female admitted with above presents to acute OT with all OT questions answered from and acute care standpoint and pt feels good about how to manage basic ADLs at home. Acute OT will D/C pt with recommendation of HHOT to address any unforeseen issues/questions that pt may have in home and help pt figure out IADLs where she can be more independent.  Follow Up Recommendations  Home health OT;Supervision/Assistance - 24 hour    Equipment Recommendations  Tub/shower bench;Other (comment)(pt reports they ordered one)       Precautions / Restrictions Precautions Precautions: Fall Restrictions Weight Bearing Restrictions: Yes LLE Weight Bearing: Touchdown weight bearing       Mobility Bed Mobility               General bed mobility comments: pt in chair upon arrival  Transfers Overall transfer level: Needs assistance Equipment used: Rolling walker (2 wheeled) Transfers: Sit to/from Stand Sit to Stand: Min guard         General transfer comment: maintains TDWB well    Balance Overall balance assessment: Needs assistance Sitting-balance support: No upper extremity supported;Feet supported Sitting balance-Leahy Scale: Good     Standing balance support: Single extremity supported;During functional activity Standing balance-Leahy Scale: Fair Standing balance comment: pulling up underwear in standing                           ADL either performed or assessed with clinical judgement   ADL Overall ADL's : Needs assistance/impaired                     Lower Body Dressing:  Supervision/safety;Set up;With adaptive equipment Lower Body Dressing Details (indicate cue type and reason): min guard A sit<>stand; for doff/don socks and don underwear; we also discussed pt using slip on shoes with back to increase ease of doffing/donning shoes           Tub/Shower Transfer Details (indicate cue type and reason): Pt reports they have ordered a tub bench. We looked a picture of a tub bench with lady sitting on it in tub and discussed the how to transfer into and out of tub as well as how to manipulate shower curtain to keep as much water in tub as possible.          Vision Patient Visual Report: No change from baseline            Cognition Arousal/Alertness: Awake/alert Behavior During Therapy: WFL for tasks assessed/performed Overall Cognitive Status: Within Functional Limits for tasks assessed                                                     Pertinent Vitals/ Pain       Pain Assessment: 0-10 Pain Score: 2  Pain Location: L hip Pain Descriptors / Indicators: (grabs then dissipates) Pain Intervention(s): Limited activity within patient's tolerance;Monitored during session;Repositioned;Ice applied  Frequency  Min 2X/week        Progress Toward Goals  OT Goals(current goals can now be found in the care plan section)  Progress towards OT goals: Progressing toward goals     Plan Discharge plan remains appropriate       AM-PAC PT "6 Clicks" Daily Activity     Outcome Measure   Help from another person eating meals?: None Help from another person taking care of personal grooming?: A Little Help from another person toileting, which includes using toliet, bedpan, or urinal?: A Little Help from another person bathing (including washing, rinsing, drying)?: A Little Help from another person to put on and taking off regular upper body clothing?: A Little Help from another person to put on and taking off regular lower  body clothing?: A Little 6 Click Score: 19    End of Session Equipment Utilized During Treatment: Rolling walker      Activity Tolerance Patient tolerated treatment well   Patient Left in chair           Time: [id number] OT Time Calculation (min): 27 min  Charges: OT General Charges $OT Visit: 1 Visit OT Treatments $Self Care/Home Management : 23-37 mins  Ignacia PalmaCathy Pocahontas Cohenour, OTR/L [phone] 11/30/2017

## 2017-11-30 NOTE — Discharge Instructions (Addendum)
Touchdown weightbearing left LE Use walker when ambulating and for transfers Follow up in the office on 12/11/17 Take aspirin 325mg  twice daily to avoid blood clots

## 2017-12-06 DIAGNOSIS — M9702XD Periprosthetic fracture around internal prosthetic left hip joint, subsequent encounter: Secondary | ICD-10-CM | POA: Diagnosis not present

## 2017-12-13 DIAGNOSIS — S79812D Other specified injuries of left hip, subsequent encounter: Secondary | ICD-10-CM | POA: Diagnosis not present

## 2017-12-13 DIAGNOSIS — S79912D Unspecified injury of left hip, subsequent encounter: Secondary | ICD-10-CM | POA: Diagnosis not present

## 2017-12-31 DIAGNOSIS — S7290XA Unspecified fracture of unspecified femur, initial encounter for closed fracture: Secondary | ICD-10-CM | POA: Diagnosis not present

## 2018-01-02 ENCOUNTER — Other Ambulatory Visit: Payer: Self-pay | Admitting: Family Medicine

## 2018-01-03 DIAGNOSIS — S79912D Unspecified injury of left hip, subsequent encounter: Secondary | ICD-10-CM | POA: Diagnosis not present

## 2018-01-07 ENCOUNTER — Other Ambulatory Visit: Payer: Self-pay | Admitting: Family Medicine

## 2018-01-14 ENCOUNTER — Telehealth: Payer: Self-pay | Admitting: Family Medicine

## 2018-01-14 MED ORDER — AMLODIPINE BESYLATE 10 MG PO TABS
ORAL_TABLET | ORAL | 0 refills | Status: DC
Start: 2018-01-14 — End: 2018-04-20

## 2018-01-14 NOTE — Telephone Encounter (Signed)
Done 30 only, must be seen

## 2018-01-30 DIAGNOSIS — S7290XA Unspecified fracture of unspecified femur, initial encounter for closed fracture: Secondary | ICD-10-CM | POA: Diagnosis not present

## 2018-02-01 ENCOUNTER — Ambulatory Visit (INDEPENDENT_AMBULATORY_CARE_PROVIDER_SITE_OTHER): Payer: PPO | Admitting: Family Medicine

## 2018-02-01 ENCOUNTER — Encounter: Payer: Self-pay | Admitting: Family Medicine

## 2018-02-01 VITALS — BP 152/76 | HR 69 | Temp 97.2°F | Ht 60.0 in | Wt 162.4 lb

## 2018-02-01 DIAGNOSIS — E785 Hyperlipidemia, unspecified: Secondary | ICD-10-CM

## 2018-02-01 DIAGNOSIS — Z7689 Persons encountering health services in other specified circumstances: Secondary | ICD-10-CM | POA: Diagnosis not present

## 2018-02-01 DIAGNOSIS — I10 Essential (primary) hypertension: Secondary | ICD-10-CM

## 2018-02-01 LAB — CBC WITH DIFFERENTIAL/PLATELET
BASOS ABS: 0 10*3/uL (ref 0.0–0.2)
Basos: 1 %
EOS (ABSOLUTE): 0.3 10*3/uL (ref 0.0–0.4)
Eos: 8 %
HEMOGLOBIN: 13.1 g/dL (ref 11.1–15.9)
Hematocrit: 37 % (ref 34.0–46.6)
IMMATURE GRANS (ABS): 0 10*3/uL (ref 0.0–0.1)
Immature Granulocytes: 0 %
LYMPHS: 35 %
Lymphocytes Absolute: 1.2 10*3/uL (ref 0.7–3.1)
MCH: 30.8 pg (ref 26.6–33.0)
MCHC: 35.4 g/dL (ref 31.5–35.7)
MCV: 87 fL (ref 79–97)
MONOCYTES: 11 %
Monocytes Absolute: 0.4 10*3/uL (ref 0.1–0.9)
NEUTROS ABS: 1.5 10*3/uL (ref 1.4–7.0)
Neutrophils: 45 %
PLATELETS: 247 10*3/uL (ref 150–379)
RBC: 4.25 x10E6/uL (ref 3.77–5.28)
RDW: 14.2 % (ref 12.3–15.4)
WBC: 3.3 10*3/uL — AB (ref 3.4–10.8)

## 2018-02-01 LAB — CMP14+EGFR
A/G RATIO: 1.8 (ref 1.2–2.2)
ALK PHOS: 88 IU/L (ref 39–117)
ALT: 14 IU/L (ref 0–32)
AST: 16 IU/L (ref 0–40)
Albumin: 4.4 g/dL (ref 3.5–4.8)
BILIRUBIN TOTAL: 0.6 mg/dL (ref 0.0–1.2)
BUN/Creatinine Ratio: 13 (ref 12–28)
BUN: 11 mg/dL (ref 8–27)
CHLORIDE: 104 mmol/L (ref 96–106)
CO2: 23 mmol/L (ref 20–29)
Calcium: 9.5 mg/dL (ref 8.7–10.3)
Creatinine, Ser: 0.83 mg/dL (ref 0.57–1.00)
GFR calc non Af Amer: 70 mL/min/{1.73_m2} (ref >59)
GFR, EST AFRICAN AMERICAN: 80 mL/min/{1.73_m2} (ref >59)
GLUCOSE: 108 mg/dL — AB (ref 65–99)
Globulin, Total: 2.5 g/dL (ref 1.5–4.5)
POTASSIUM: 4 mmol/L (ref 3.5–5.2)
Sodium: 142 mmol/L (ref 134–144)
Total Protein: 6.9 g/dL (ref 6.0–8.5)

## 2018-02-01 LAB — LIPID PANEL
CHOLESTEROL TOTAL: 208 mg/dL — AB (ref 100–199)
Chol/HDL Ratio: 6.7 ratio — ABNORMAL HIGH (ref 0.0–4.4)
HDL: 31 mg/dL — ABNORMAL LOW (ref >39)
LDL Calculated: 143 mg/dL — ABNORMAL HIGH (ref 0–99)
TRIGLYCERIDES: 168 mg/dL — AB (ref 0–149)
VLDL CHOLESTEROL CAL: 34 mg/dL (ref 5–40)

## 2018-02-01 NOTE — Progress Notes (Signed)
   HPI  Patient presents today for all of chronic medical conditions.  For lipidemia Tolerating fish oil well, watching diet moderately.  Hypertension Patient states that blood pressure at home is 120s over 60s Good medication compliance and tolerance.  Patient is recovering well from her previous hip fracture which was treated nonoperatively.   PMH: Smoking status noted ROS: Per HPI  Objective: BP (!) 152/76   Pulse 69   Temp (!) 97.2 F (36.2 C) (Oral)   Ht 5' (1.524 m)   Wt 162 lb 6.4 oz (73.7 kg)   BMI 31.72 kg/m  Gen: NAD, alert, cooperative with exam HEENT: NCAT CV: RRR, good S1/S2, no murmur Resp: CTABL, no wheezes, non-labored Ext: No edema, warm Neuro: Alert and oriented, No gross deficits  Assessment and plan:  #Hyperlipidemia LDL goal less than 100, continue fish oil Labs today  #Hypertension Well-controlled at home, slightly elevated here, no changes Labs   Orders Placed This Encounter  Procedures  . CMP14+EGFR  . CBC with Differential/Platelet  . Lipid panel     Laroy Apple, MD [facility] 02/01/2018, 8:29 AM

## 2018-02-05 ENCOUNTER — Encounter: Payer: Self-pay | Admitting: *Deleted

## 2018-02-05 ENCOUNTER — Ambulatory Visit (INDEPENDENT_AMBULATORY_CARE_PROVIDER_SITE_OTHER): Payer: PPO | Admitting: *Deleted

## 2018-02-05 VITALS — BP 154/74 | HR 66 | Ht 59.75 in | Wt 165.0 lb

## 2018-02-05 DIAGNOSIS — S72002S Fracture of unspecified part of neck of left femur, sequela: Secondary | ICD-10-CM

## 2018-02-05 DIAGNOSIS — Z0000 Encounter for general adult medical examination without abnormal findings: Secondary | ICD-10-CM

## 2018-02-05 DIAGNOSIS — Z780 Asymptomatic menopausal state: Secondary | ICD-10-CM

## 2018-02-05 NOTE — Patient Instructions (Signed)
  Deborah Pruitt , Thank you for taking time to come for your Medicare Wellness Visit. I appreciate your ongoing commitment to your health goals. Please review the following plan we discussed and let me know if I can assist you in the future.   These are the goals we discussed: Goals    . Exercise 150 min/wk Moderate Activity       This is a list of the screening recommended for you and due dates:  Health Maintenance  Topic Date Due  . Pneumonia vaccines (2 of 2 - PPSV23) 08/23/2018*  . Flu Shot  04/25/2018  . Mammogram  11/20/2019  . Tetanus Vaccine  08/20/2023  . Colon Cancer Screening  09/30/2024  . DEXA scan (bone density measurement)  Completed  *Topic was postponed. The date shown is not the original due date.

## 2018-02-05 NOTE — Progress Notes (Addendum)
Subjective:   Deborah Pruitt is a 77 y.o. female who presents for a Medicare Annual Wellness Visit. Deborah Pruitt lives at home with her husband. She has some college experience and recently retired from Audiological scientist. She has an adult daughter and an adult son. Her daughter is a International aid/development worker and is doing a Marketing executive trip to [employer] currently lives in [place]. Son works in Production designer, theatre/television/film in a church in [place]. She has twin grandsons and a granddaughter.     Review of Systems    Patient reports that her health is unchanged compared to last year.  Cardiac Risk Factors include: advanced age (>69men, >73 women);dyslipidemia;hypertension;sedentary lifestyle;obesity (BMI >30kg/m2)  Musc: Cracked left hip in march with fall. Had a left hip replacement 20 years ago.  Uses a four prong cane and a walker some at home Deborah Pruitt, ortho said it would take 3 or 4 months to heal   Musc:   Other systems negative unless otherwise noted in other area.       Current Medications (verified) Outpatient Encounter Medications as of 02/05/2018  Medication Sig  . acidophilus (RISAQUAD) CAPS capsule Take 2 capsules by mouth daily.  Marland Kitchen amLODipine (NORVASC) 10 MG tablet TAKE 1 TABLET BY MOUTH EVERY DAY  . Cholecalciferol (VITAMIN D3) 2000 UNITS TABS Take 1 tablet by mouth daily.  . Cyanocobalamin (VITAMIN B 12 PO) Take 1 tablet by mouth daily.  . Multiple Vitamin (MULTIVITAMIN WITH MINERALS) TABS tablet Take 2 tablets by mouth daily.  Marland Kitchen omega-3 acid ethyl esters (LOVAZA) 1 g capsule Take 2 capsules (2 g total) by mouth 2 (two) times daily. (Patient taking differently: Take 2 capsules by mouth daily. )  . pantoprazole (PROTONIX) 40 MG tablet TAKE 1 TABLET (40 MG TOTAL) BY MOUTH DAILY.  Marland Kitchen RESTASIS MULTIDOSE 0.05 % ophthalmic emulsion Place 1 drop into both eyes 2 (two) times daily.    No facility-administered encounter medications on file as of 02/05/2018.     Allergies (verified) Milk-related  compounds; Ciprofloxacin; Dexilant [dexlansoprazole]; Penicillins; and Vioxx [rofecoxib]   History: Past Medical History:  Diagnosis Date  . Arthritis   . Asthma   . Complication of anesthesia    Ileus post hip replacement  . GERD (gastroesophageal reflux disease)   . Hyperlipidemia   . Hypertension   . Urinary tract infection 2012   Past Surgical History:  Procedure Laterality Date  . CATARACT EXTRACTION W/PHACO Right 07/23/2014   Procedure: CATARACT EXTRACTION PHACO AND INTRAOCULAR LENS PLACEMENT RIGHT EYE CDE=6.24;  Surgeon: Gemma Payor, MD;  Location: [facility];  Service: Ophthalmology;  Laterality: Right;  . CATARACT EXTRACTION W/PHACO Left 08/17/2014   Procedure: CATARACT EXTRACTION PHACO AND INTRAOCULAR LENS PLACEMENT LEFT EYE;  Surgeon: Gemma Payor, MD;  Location: [facility];  Service: Ophthalmology;  Laterality: Left;  CDE:4.65  . CHOLECYSTECTOMY    . JOINT REPLACEMENT Left    hip  . TOTAL HIP ARTHROPLASTY Left 1999   Family History  Problem Relation Age of Onset  . Stroke Mother 61  . Hypertension Mother   . Heart disease Mother   . Heart disease Father   . Diabetes Father   . Liver cancer Brother   . Lung cancer Brother   . Stomach cancer Maternal Grandmother   . Colon cancer Neg Hx   . Colon polyps Neg Hx   . Esophageal cancer Neg Hx    Social History   Socioeconomic History  . Marital status: Married    Spouse name: Not on file  .  Number of children: 2  . Years of education: 54  . Highest education level: Some college, no degree  Occupational History  . Occupation: Retired    Comment: Architectural technologist  . Financial resource strain: Not hard at all  . Food insecurity:    Worry: Never true    Inability: Never true  . Transportation needs:    Medical: No    Non-medical: No  Tobacco Use  . Smoking status: Never Smoker  . Smokeless tobacco: Never Used  Substance and Sexual Activity  . Alcohol use: No  . Drug use: No  . Sexual activity: Yes     Birth control/protection: Post-menopausal  Lifestyle  . Physical activity:    Days per week: 5 days    Minutes per session: 30 min  . Stress: Not at all  Relationships  . Social connections:    Talks on phone: More than three times a week    Gets together: More than three times a week    Attends religious service: More than 4 times per year    Active member of club or organization: Yes    Attends meetings of clubs or organizations: More than 4 times per year    Relationship status: Married  Other Topics Concern  . 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      She is married with one son and one daughter. No alcohol caffeine or tobacco.   This was updated 07/28/2014    Tobacco Use No.  Clinical Intake:  Pre-visit preparation completed: No  Pain : 0-10 Pain Score: 3  Pain Type: Chronic pain Pain Location: Hip Pain Orientation: Left Pain Descriptors / Indicators: Aching Pain Onset: More than a month ago Pain Frequency: Constant Pain Relieving Factors: Advil and rest Effect of Pain on Daily Activities: has slowed her down a lot  Pain Relieving Factors: Advil and rest  Nutritional Status: BMI > 30  Obese Diabetes: No  How often do you need to have someone help you when you read instructions, pamphlets, or other written materials from your doctor or pharmacy?: 1 - Never What is the last grade level you completed in school?: some college     Information entered by :: Demetrios Loll, RN   Activities of Daily Living In your present state of health, do you have any difficulty performing the following activities: 02/05/2018 11/28/2017  Hearing? N N  Vision? Y N  Comment chronic dry eyes and has some decrease in vision. Has routine eye exams -  Difficulty concentrating or making decisions? N N  Walking or climbing stairs? N N  Dressing or  bathing? N N  Doing errands, shopping? N N  Preparing Food and eating ? N -  Using the Toilet? N -  In the past six months, have you accidently leaked urine? N -  Do you have problems with loss of bowel control? N -  Managing your Medications? N -  Managing your Finances? N -  Housekeeping or managing your Housekeeping? N -  Some recent data might be hidden     Immunizations and Health Maintenance Immunization History  Administered Date(s) Administered  . Influenza, High Dose Seasonal PF 08/23/2017  . Pneumococcal Conjugate-13 05/12/2016  . Tdap 08/19/2013   There are no preventive care reminders to display for this patient.  Exercise Current Exercise Habits: The patient  does not participate in regular exercise at present(stays busy around home and yard), Type of exercise: walking, Time (Minutes): 20, Frequency (Times/Week): 5, Weekly Exercise (Minutes/Week): 100, Intensity: Mild, Exercise limited by: orthopedic condition(s)(left hip fracture 2 months ago)   Depression Screen PHQ 2/9 Scores 02/05/2018 02/01/2018 08/23/2017 11/01/2016 05/12/2016 05/12/2015 08/19/2013  PHQ - 2 Score 0 0 0 0 0 0 0     Fall Risk Fall Risk  02/05/2018 02/01/2018 08/23/2017 11/01/2016 05/12/2016  Falls in the past year? Yes Yes No No No  Number falls in past yr: 1 1 - - -  Injury with Fall? Yes Yes - - -  Comment - fracture left hip - - -  Risk Factor Category  High Fall Risk - - - -  Risk for fall due to : History of fall(s) - - - -  Follow up Falls prevention discussed - - - -    Safety Is the patient's home free of loose throw rugs in walkways, pet beds, electrical cords, etc?   yes      Grab bars in the bathroom? no      Walkin shower? no      Shower Seat? no      Handrails on the stairs?   yes      Adequate lighting?   yes  Patient Care Team: Elenora Gamma, MD as PCP - General (Family Medicine)   No hospitalizations, ER visits, or surgeries this past year.  Objective:    Today's  Vitals   02/05/18 1411 02/05/18 1413  BP: (!) 154/74   Pulse: 66   Weight: 165 lb (74.8 kg)   Height: 4' 11.75" (1.518 m)   PainSc:  3    Body mass index is 32.49 kg/m.  Advanced Directives 02/05/2018 11/28/2017 11/28/2017 06/12/2015 08/17/2014 07/23/2014 07/20/2014  Does Patient Have a Medical Advance Directive? Yes Yes No No No No No  Type of Estate agent of [place];Living will Healthcare Power of [place];Living will - - - - -  Does patient want to make changes to medical advance directive? - No - Patient declined - - - - -  Copy of Healthcare Power of Attorney in Chart? No - copy requested No - copy requested - - - - -  Would patient like information on creating a medical advance directive? - - - No - patient declined information No - patient declined information No - patient declined information No - patient declined information    Hearing/Vision  normal or No deficits noted during visit.  Cognitive Function: MMSE - Mini Mental State Exam 02/05/2018  Orientation to time 5  Orientation to Place 5  Registration 3  Attention/ Calculation 5  Recall 3  Language- name 2 objects 2  Language- repeat 1  Language- follow 3 step command 3  Language- read & follow direction 1  Write a sentence 1  Copy design 1  Total score 30       Normal Cognitive Function Screening: Yes      Assessment:   This is a routine wellness examination for K[place].    Plan:    Goals    . Exercise 150 min/wk Moderate Activity       Keep f/u with Elenora Gamma, MD and any other specialty appointments you may have Continue current medications Move carefully to avoid falls. Use assistive devices like a can or walker if needed. Aim for at least 150 minutes of moderate activity a week. This can be  done with chair exercises if necessary. Read or work on puzzles daily Stay connected with friends and family  Review and return a signed, witnessed, and notarized copy of  Advance Directives if given.  Health Maintenance: Tdap Vaccine recommended: no Zostavax (Shingles vaccine) recommended:no Prevnar or Pneumovax (pneumonia vaccines) recommended:yes  Cancer Screenings: Lung: Low Dose CT Chest recommended if Age 84-80 years, 30 pack-year currently smoking OR have quit w/in 15years. Patient does not qualify. Colon cancer screening recommended: no Mammogram recommended:no Pap Smear recommended: not applicable  Additional Screenings Hepatitis C Screening recommended: no Dexa Scan recommended: yes Diabetic Eye Exam recommended: not applicable  Orders Placed This Encounter  Procedures  . DG [facility] DEXA    Order Specific Question:   Reason for Exam (SYMPTOM  OR DIAGNOSIS REQUIRED)    Answer:   post menopausal with current hip fracture    I have personally reviewed and noted the following in the patient's chart:   . Medical and social history . Use of alcohol, tobacco or illicit drugs  . Current medications and supplements . Functional ability and status . Nutritional status . Physical activity . Advanced directives . List of other physicians . Hospitalizations, surgeries, and ER visits in previous 12 months . Vitals . Screenings to include cognitive, depression, and falls . Referrals and appointments  In addition, I have reviewed and discussed with patient certain preventive protocols, quality metrics, and best practice recommendations. A written personalized care plan for preventive services as well as general preventive health recommendations were provided to patient.     Demetrios Loll, RN   02/05/18     I have reviewed and agree with the above AWV documentation.   Murtis Sink, MD [facility] 02/22/2018, 11:43 AM

## 2018-02-06 LAB — SPECIMEN STATUS REPORT

## 2018-02-06 LAB — HGB A1C W/O EAG: Hgb A1c MFr Bld: 5.5 % (ref 4.8–5.6)

## 2018-02-25 ENCOUNTER — Ambulatory Visit (INDEPENDENT_AMBULATORY_CARE_PROVIDER_SITE_OTHER): Payer: PPO

## 2018-02-25 DIAGNOSIS — Z780 Asymptomatic menopausal state: Secondary | ICD-10-CM | POA: Diagnosis not present

## 2018-02-25 DIAGNOSIS — M810 Age-related osteoporosis without current pathological fracture: Secondary | ICD-10-CM | POA: Diagnosis not present

## 2018-04-03 ENCOUNTER — Ambulatory Visit (INDEPENDENT_AMBULATORY_CARE_PROVIDER_SITE_OTHER): Payer: PPO | Admitting: Family Medicine

## 2018-04-03 ENCOUNTER — Encounter: Payer: Self-pay | Admitting: Family Medicine

## 2018-04-03 VITALS — BP 157/76 | HR 71 | Temp 98.2°F | Ht 59.75 in | Wt 163.8 lb

## 2018-04-03 DIAGNOSIS — M810 Age-related osteoporosis without current pathological fracture: Secondary | ICD-10-CM

## 2018-04-03 NOTE — Patient Instructions (Signed)
Great to see you!  Try to get 900 mg of elemental calcium daily, we will check your vitamin D with your next blood draw  Continue 200 IU Vitamin D3 daily  Consider prolia or fosomax, I think either is a good idea.

## 2018-04-03 NOTE — Progress Notes (Signed)
   HPI  Patient presents today discuss osteoporosis.  Patient had a periprosthetic hip fracture in March.  She had evidence of bone healing on her last orthopedic visit in she was released from their care.  Patient does not eat any dairy, however she does take calcium supplements twice daily currently.  She states that she takes 2000 units of vitamin D3 daily.  PMH: Smoking status noted ROS: Per HPI  Objective: BP (!) 157/76   Pulse 71   Temp 98.2 F (36.8 C) (Oral)   Ht 4' 11.75" (1.518 m)   Wt 163 lb 12.8 oz (74.3 kg)   BMI 32.26 kg/m  Gen: NAD, alert, cooperative with exam HEENT: NCAT CV: RRR, good S1/S2, no murmur Resp: CTABL, no wheezes, non-labored Ext: No edema, warm Neuro: Alert and oriented, No gross deficits  Assessment and plan:  #Osteoporosis Patient with nonoperative hip fracture 4 to 5 months ago, with evidence of bone healing on last orthopedic follow-up Discussed calcium supplementation, vitamin D supplementation, check vitamin D with next blood draw Consider Prolia versus Fosamax, with evidence of bone healing I think Prolia would be a safe choice. She will call back with her decision   Murtis SinkSam Tasean Mancha, MD [facility] 04/03/2018, 8:47 AM

## 2018-04-12 DIAGNOSIS — H26491 Other secondary cataract, right eye: Secondary | ICD-10-CM | POA: Diagnosis not present

## 2018-04-20 ENCOUNTER — Other Ambulatory Visit: Payer: Self-pay | Admitting: Family Medicine

## 2018-04-23 ENCOUNTER — Other Ambulatory Visit: Payer: Self-pay | Admitting: Family Medicine

## 2018-06-04 DIAGNOSIS — H02423 Myogenic ptosis of bilateral eyelids: Secondary | ICD-10-CM | POA: Diagnosis not present

## 2018-06-12 ENCOUNTER — Encounter: Payer: Self-pay | Admitting: Family Medicine

## 2018-06-12 DIAGNOSIS — Z01818 Encounter for other preprocedural examination: Secondary | ICD-10-CM | POA: Diagnosis not present

## 2018-06-12 DIAGNOSIS — H02423 Myogenic ptosis of bilateral eyelids: Secondary | ICD-10-CM | POA: Diagnosis not present

## 2018-06-26 DIAGNOSIS — H02411 Mechanical ptosis of right eyelid: Secondary | ICD-10-CM | POA: Diagnosis not present

## 2018-06-26 DIAGNOSIS — H02422 Myogenic ptosis of left eyelid: Secondary | ICD-10-CM | POA: Diagnosis not present

## 2018-06-26 DIAGNOSIS — H02403 Unspecified ptosis of bilateral eyelids: Secondary | ICD-10-CM | POA: Diagnosis not present

## 2018-06-26 DIAGNOSIS — H02423 Myogenic ptosis of bilateral eyelids: Secondary | ICD-10-CM | POA: Diagnosis not present

## 2018-07-19 ENCOUNTER — Other Ambulatory Visit: Payer: Self-pay | Admitting: *Deleted

## 2018-07-19 DIAGNOSIS — K2100 Gastro-esophageal reflux disease with esophagitis, without bleeding: Secondary | ICD-10-CM

## 2018-07-19 MED ORDER — PANTOPRAZOLE SODIUM 40 MG PO TBEC: 40.0000 mg | DELAYED_RELEASE_TABLET | Freq: Every day | ORAL | 0 refills | Status: DC

## 2018-07-19 NOTE — Telephone Encounter (Signed)
OV 08/02/18 

## 2018-08-02 ENCOUNTER — Encounter: Payer: Self-pay | Admitting: Family Medicine

## 2018-08-02 ENCOUNTER — Ambulatory Visit (INDEPENDENT_AMBULATORY_CARE_PROVIDER_SITE_OTHER): Payer: PPO | Admitting: Family Medicine

## 2018-08-02 VITALS — BP 133/72 | HR 69 | Temp 97.8°F | Ht 59.0 in | Wt 161.0 lb

## 2018-08-02 DIAGNOSIS — E785 Hyperlipidemia, unspecified: Secondary | ICD-10-CM | POA: Diagnosis not present

## 2018-08-02 DIAGNOSIS — D729 Disorder of white blood cells, unspecified: Secondary | ICD-10-CM

## 2018-08-02 DIAGNOSIS — I10 Essential (primary) hypertension: Secondary | ICD-10-CM

## 2018-08-02 DIAGNOSIS — Z23 Encounter for immunization: Secondary | ICD-10-CM | POA: Diagnosis not present

## 2018-08-02 DIAGNOSIS — K2100 Gastro-esophageal reflux disease with esophagitis, without bleeding: Secondary | ICD-10-CM

## 2018-08-02 DIAGNOSIS — M810 Age-related osteoporosis without current pathological fracture: Secondary | ICD-10-CM

## 2018-08-02 LAB — CBC
HEMATOCRIT: 40.6 % (ref 34.0–46.6)
Hemoglobin: 13.5 g/dL (ref 11.1–15.9)
MCH: 30.8 pg (ref 26.6–33.0)
MCHC: 33.3 g/dL (ref 31.5–35.7)
MCV: 93 fL (ref 79–97)
Platelets: 264 10*3/uL (ref 150–450)
RBC: 4.39 x10E6/uL (ref 3.77–5.28)
RDW: 13.3 % (ref 12.3–15.4)
WBC: 4.5 10*3/uL (ref 3.4–10.8)

## 2018-08-02 MED ORDER — ATORVASTATIN CALCIUM 40 MG PO TABS: 40.0000 mg | ORAL_TABLET | Freq: Every day | ORAL | 0 refills | Status: DC

## 2018-08-02 NOTE — Patient Instructions (Signed)
Ok to stop the Omega -3s.  Start Atorvastatin 1 tablet at bedtime every day. See me back in 3 months to recheck your cholesterol. Baxter Hire will call you to set up your osteoporosis treatment.  You had labs performed today.  You will be contacted with the results of the labs once they are available, usually in the next 3 business days for routine lab work.     Cholesterol Cholesterol is a white, waxy, fat-like substance that is needed by the human body in small amounts. The liver makes all the cholesterol we need. Cholesterol is carried from the liver by the blood through the blood vessels. Deposits of cholesterol (plaques) may build up on blood vessel (artery) walls. Plaques make the arteries narrower and stiffer. Cholesterol plaques increase the risk for heart attack and stroke. You cannot feel your cholesterol level even if it is very high. The only way to know that it is high is to have a blood test. Once you know your cholesterol levels, you should keep a record of the test results. Work with your health care provider to keep your levels in the desired range. What do the results mean?  Total cholesterol is a rough measure of all the cholesterol in your blood.  LDL (low-density lipoprotein) is the "bad" cholesterol. This is the type that causes plaque to build up on the artery walls. You want this level to be low.  HDL (high-density lipoprotein) is the "good" cholesterol because it cleans the arteries and carries the LDL away. You want this level to be high.  Triglycerides are fat that the body can either burn for energy or store. High levels are closely linked to heart disease. What are the desired levels of cholesterol?  Total cholesterol below 200.  LDL below 100 for people who are at risk, below 70 for people at very high risk.  HDL above 40 is good. A level of 60 or higher is considered to be protective against heart disease.  Triglycerides below 150. How can I lower my  cholesterol? Diet Follow your diet program as told by your health care provider.  Choose fish or white meat chicken and [place], roasted or baked. Limit fatty cuts of red meat, fried foods, and processed meats, such as sausage and lunch meats.  Eat lots of fresh fruits and vegetables.  Choose whole grains, beans, pasta, potatoes, and cereals.  Choose olive oil, corn oil, or canola oil, and use only small amounts.  Avoid butter, mayonnaise, shortening, or palm kernel oils.  Avoid foods with trans fats.  Drink skim or nonfat milk and eat low-fat or nonfat yogurt and cheeses. Avoid whole milk, cream, ice cream, egg yolks, and full-fat cheeses.  Healthier desserts include angel food cake, ginger snaps, animal crackers, hard candy, popsicles, and low-fat or nonfat frozen yogurt. Avoid pastries, cakes, pies, and cookies.  Exercise  Follow your exercise program as told by your health care provider. A regular program: ? Helps to decrease LDL and raise HDL. ? Helps with weight control.  Do things that increase your activity level, such as gardening, walking, and taking the stairs.  Ask your health care provider about ways that you can be more active in your daily life.  Medicine  Take over-the-counter and prescription medicines only as told by your health care provider. ? Medicine may be prescribed by your health care provider to help lower cholesterol and decrease the risk for heart disease. This is usually done if diet and exercise have failed  to bring down cholesterol levels. ? If you have several risk factors, you may need medicine even if your levels are normal.  This information is not intended to replace advice given to you by your health care provider. Make sure you discuss any questions you have with your health care provider. Document Released: 06/06/2001 Document Revised: 04/08/2016 Document Reviewed: 03/11/2016 Elsevier Interactive Patient Education  [employer].

## 2018-08-02 NOTE — Progress Notes (Signed)
Subjective: CC: Follow-up osteoporosis, hypertension and hyperlipidemia PCP: Elenora Gamma, MD [id number] C Matchett is a 77 y.o. female presenting to clinic today for:  1.  Osteoporosis Patient noted to have a T score of -3.3 on her recent DEXA scan.  At her last visit with her previous PCP, they discussed Fosamax versus Prolia.  Patient notes that she does not feel confident about taking Fosamax, particularly because she has underlying acid reflux.  She is interested in starting Prolia.  Of note, patient with medical history significant for hip fracture with bone graft in the 1990s on the left.  She refractured this hip earlier this year in March.  No history of MI, CVA.  She is currently on vitamin D and calcium over-the-counter.  2.  Hyperlipidemia Patient is currently treated with lovaza but states that she feels it causes lower extremity edema.  She actually had to discontinue the medication prior to an ocular surgery recently and states that her symptoms seem to improve with that.  Her ASCVD risk score is 22.4 percent.  No chest pain, shortness of breath.  3.  Hypertension Patient reports compliance with Norvasc 10 mg daily.  Blood pressures typically run her 120s over 70s at home.  Denies any chest pain or shortness of breath.  ROS: Per HPI  Allergies  Allergen Reactions  . Milk-Related Compounds Other (See Comments)    "I almost died"  . Ciprofloxacin Hives and Swelling  . Dexilant [Dexlansoprazole] Hives and Swelling  . Penicillins Other (See Comments)    Childhood allergy.  . Vioxx [Rofecoxib] Hives and Swelling   Past Medical History:  Diagnosis Date  . Arthritis   . Asthma   . Complication of anesthesia    Ileus post hip replacement  . GERD (gastroesophageal reflux disease)   . Hyperlipidemia   . Hypertension   . Urinary tract infection 2012    Current Outpatient Medications:  .  acidophilus (RISAQUAD) CAPS capsule, Take 2 capsules by mouth daily., Disp:  , Rfl:  .  amLODipine (NORVASC) 10 MG tablet, TAKE 1 TABLET BY MOUTH DAILY, Disp: 90 tablet, Rfl: 0 .  Cholecalciferol (VITAMIN D3) 2000 UNITS TABS, Take 1 tablet by mouth daily., Disp: , Rfl:  .  Cyanocobalamin (VITAMIN B 12 PO), Take 1 tablet by mouth daily., Disp: , Rfl:  .  Multiple Vitamin (MULTIVITAMIN WITH MINERALS) TABS tablet, Take 2 tablets by mouth daily., Disp: , Rfl:  .  omega-3 acid ethyl esters (LOVAZA) 1 g capsule, Take 2 capsules (2 g total) by mouth 2 (two) times daily. (Patient taking differently: Take 2 capsules by mouth daily. ), Disp: 360 capsule, Rfl: 3 .  pantoprazole (PROTONIX) 40 MG tablet, Take 1 tablet (40 mg total) by mouth daily., Disp: 90 tablet, Rfl: 0 Social History   Socioeconomic History  . Marital status: Married    Spouse name: Not on file  . Number of children: 2  . Years of education: 50  . Highest education level: Some college, no degree  Occupational History  . Occupation: Retired    Comment: Architectural technologist  . Financial resource strain: Not hard at all  . Food insecurity:    Worry: Never true    Inability: Never true  . Transportation needs:    Medical: No    Non-medical: No  Tobacco Use  . Smoking status: Never Smoker  . Smokeless tobacco: Never Used  Substance and Sexual Activity  . Alcohol use: No  . Drug use:  No  . Sexual activity: Yes    Birth control/protection: Post-menopausal  Lifestyle  . Physical activity:    Days per week: 5 days    Minutes per session: 30 min  . Stress: Not at all  Relationships  . Social connections:    Talks on phone: More than three times a week    Gets together: More than three times a week    Attends religious service: More than 4 times per year    Active member of club or organization: Yes    Attends meetings of clubs or organizations: More than 4 times per year    Relationship status: Married  . Intimate partner violence:    Fear of current or ex partner: No    Emotionally abused:  No    Physically abused: No    Forced sexual activity: No  Other Topics Concern  . 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      She is married with one son and one daughter. No alcohol caffeine or tobacco.   This was updated 07/28/2014   Family History  Problem Relation Age of Onset  . Stroke Mother 59  . Hypertension Mother   . Heart disease Mother   . Heart disease Father   . Diabetes Father   . Liver cancer Brother   . Lung cancer Brother   . Stomach cancer Maternal Grandmother   . Colon cancer Neg Hx   . Colon polyps Neg Hx   . Esophageal cancer Neg Hx     Objective: Office vital signs reviewed. BP 133/72   Pulse 69   Temp 97.8 F (36.6 C) (Oral)   Ht 4\' 11"  (1.499 m)   Wt 161 lb (73 kg)   BMI 32.52 kg/m   Physical Examination:  General: Awake, alert, well nourished, No acute distress HEENT: Normal, sclera white, MMM Cardio: regular rate and rhythm, S1S2 heard, no murmurs appreciated Pulm: clear to auscultation bilaterally, no wheezes, rhonchi or rales; normal work of breathing on room air Extremities: warm, well perfused, No edema, cyanosis or clubbing; +2 pulses bilaterally  The 10-year ASCVD risk score Denman George DC Jr., et al., 2013) is: 22.4%   Values used to calculate the score:     Age: 35 years     Sex: Female     Is Non-Hispanic African American: No     Diabetic: No     Tobacco smoker: No     Systolic Blood Pressure: 133 mmHg     Is BP treated: Yes     HDL Cholesterol: 31 mg/dL     Total Cholesterol: 208 mg/dL  Assessment/ Plan: 77 y.o. female   1. Essential hypertension Blood pressure under good control with current regimen.  No changes made.  2. Hyperlipidemia with target LDL less than 100 We discussed her ASCVD risk score and we will start Lipitor 40 mg daily.  Okay to discontinue the omega-3's if she is having  adverse side effects.  We will plan to recheck a fasting lipid in 3 months.  3. Gastroesophageal reflux disease with esophagitis Continue Protonix.  4. Age-related osteoporosis without current pathological fracture Likely will not tolerate Fosamax.  We discussed the evenity and Prolia.  Will work on getting this covered for osteoporosis.  5. Abnormal white blood cell (WBC) Had a white blood cell count of 3.3 in May.  We will recheck this today. - CBC   Orders Placed This Encounter  Procedures  . CBC   Meds ordered this encounter  Medications  . atorvastatin (LIPITOR) 40 MG tablet    Sig: Take 1 tablet (40 mg total) by mouth daily.    Dispense:  90 tablet    Refill:  0     Recie Cirrincione Hulen Skains, DO [facility] [phone]

## 2018-08-08 DIAGNOSIS — H43393 Other vitreous opacities, bilateral: Secondary | ICD-10-CM | POA: Diagnosis not present

## 2018-08-08 DIAGNOSIS — H04123 Dry eye syndrome of bilateral lacrimal glands: Secondary | ICD-10-CM | POA: Diagnosis not present

## 2018-08-08 DIAGNOSIS — H43813 Vitreous degeneration, bilateral: Secondary | ICD-10-CM | POA: Diagnosis not present

## 2018-08-08 DIAGNOSIS — H35341 Macular cyst, hole, or pseudohole, right eye: Secondary | ICD-10-CM | POA: Diagnosis not present

## 2018-08-12 ENCOUNTER — Telehealth: Payer: Self-pay | Admitting: *Deleted

## 2018-08-12 NOTE — Telephone Encounter (Signed)
Insurance verification submitted and summary of benefits received. Patient will owe 20% of the cost which is roughly $240. No prior authorization is needed and it is only available as buy and bill through the office.

## 2018-10-13 ENCOUNTER — Other Ambulatory Visit: Payer: Self-pay | Admitting: Family Medicine

## 2018-10-13 DIAGNOSIS — K2100 Gastro-esophageal reflux disease with esophagitis, without bleeding: Secondary | ICD-10-CM

## 2018-10-29 ENCOUNTER — Other Ambulatory Visit: Payer: Self-pay | Admitting: *Deleted

## 2018-10-29 MED ORDER — AMLODIPINE BESYLATE 10 MG PO TABS: ORAL_TABLET | ORAL | 0 refills | Status: DC

## 2018-11-11 IMAGING — CR DG FEMUR 2+V*L*
4 series · 4 of 4 positions shown · non-contrast
Comparison: None.

CLINICAL DATA: Recent fall with left leg pain, initial encounter

EXAM:
LEFT FEMUR 2 VIEWS

[femur ap (1 of 2)]
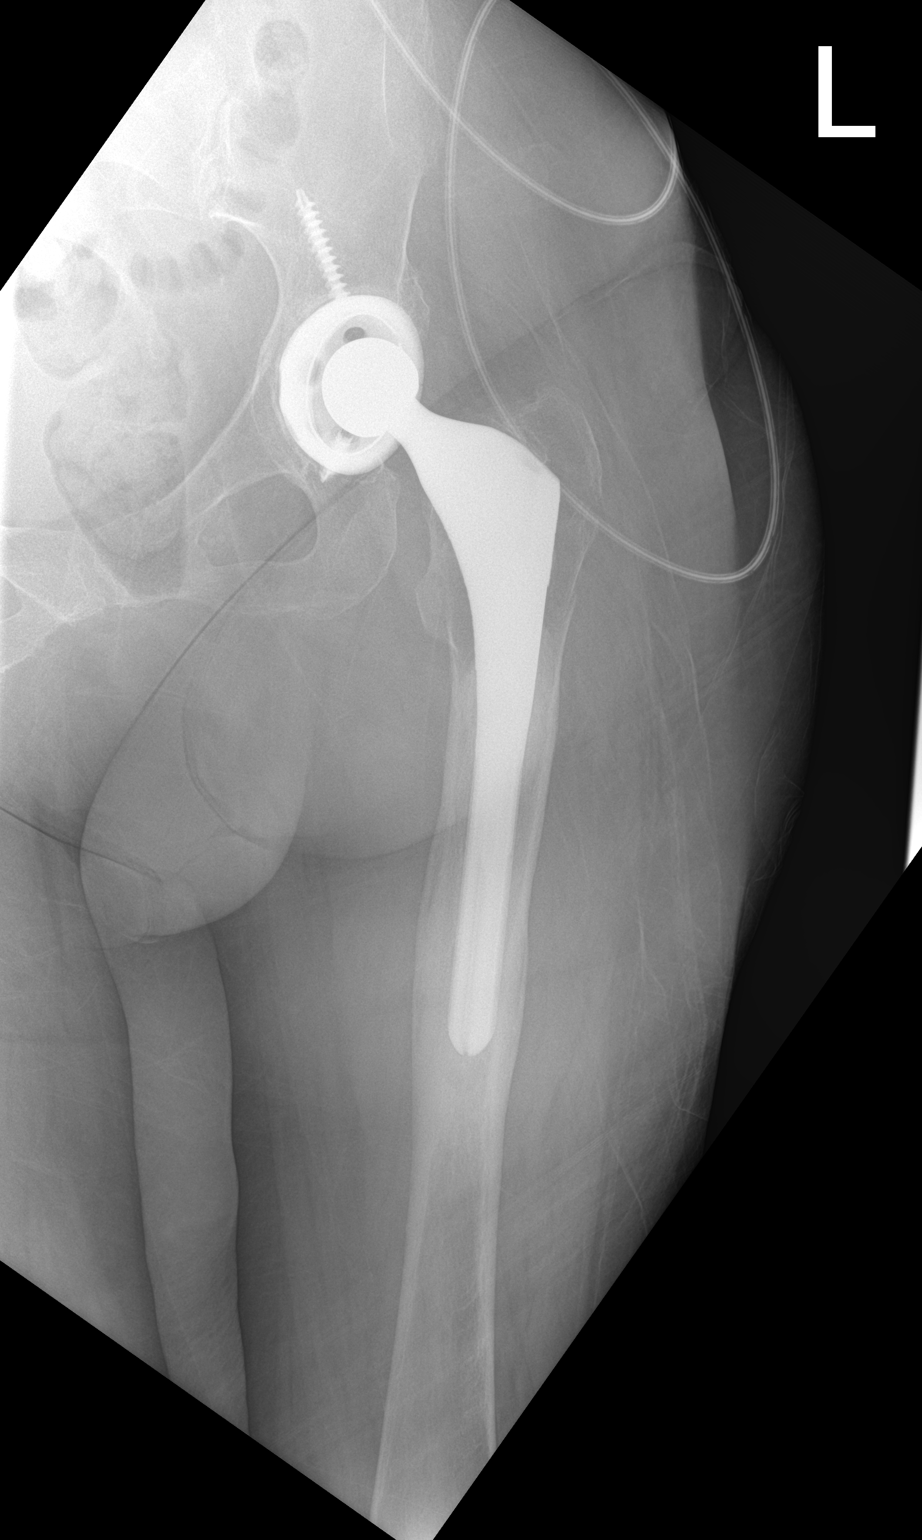

[femur ap (2 of 2)]
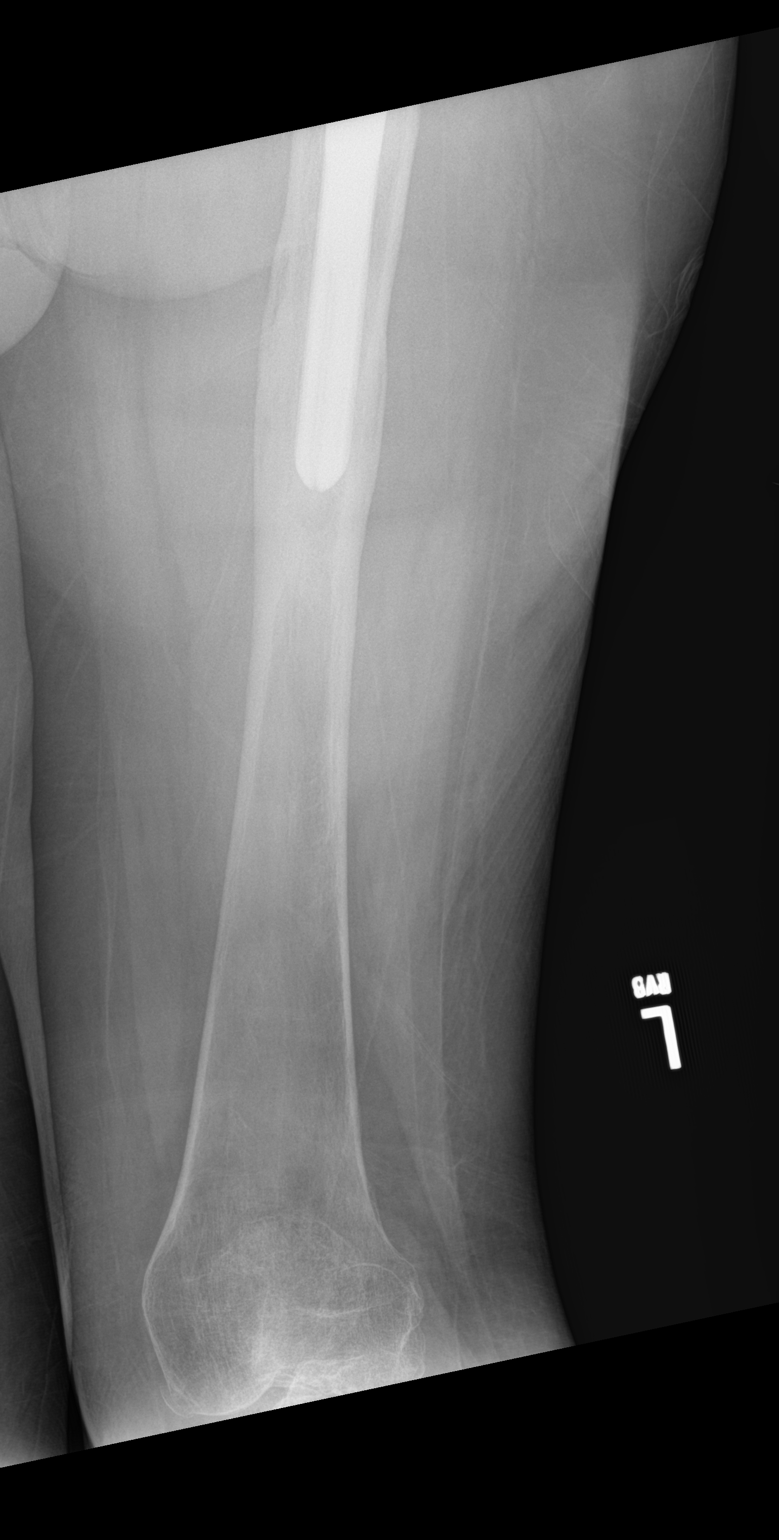

[w hip lateral left *]
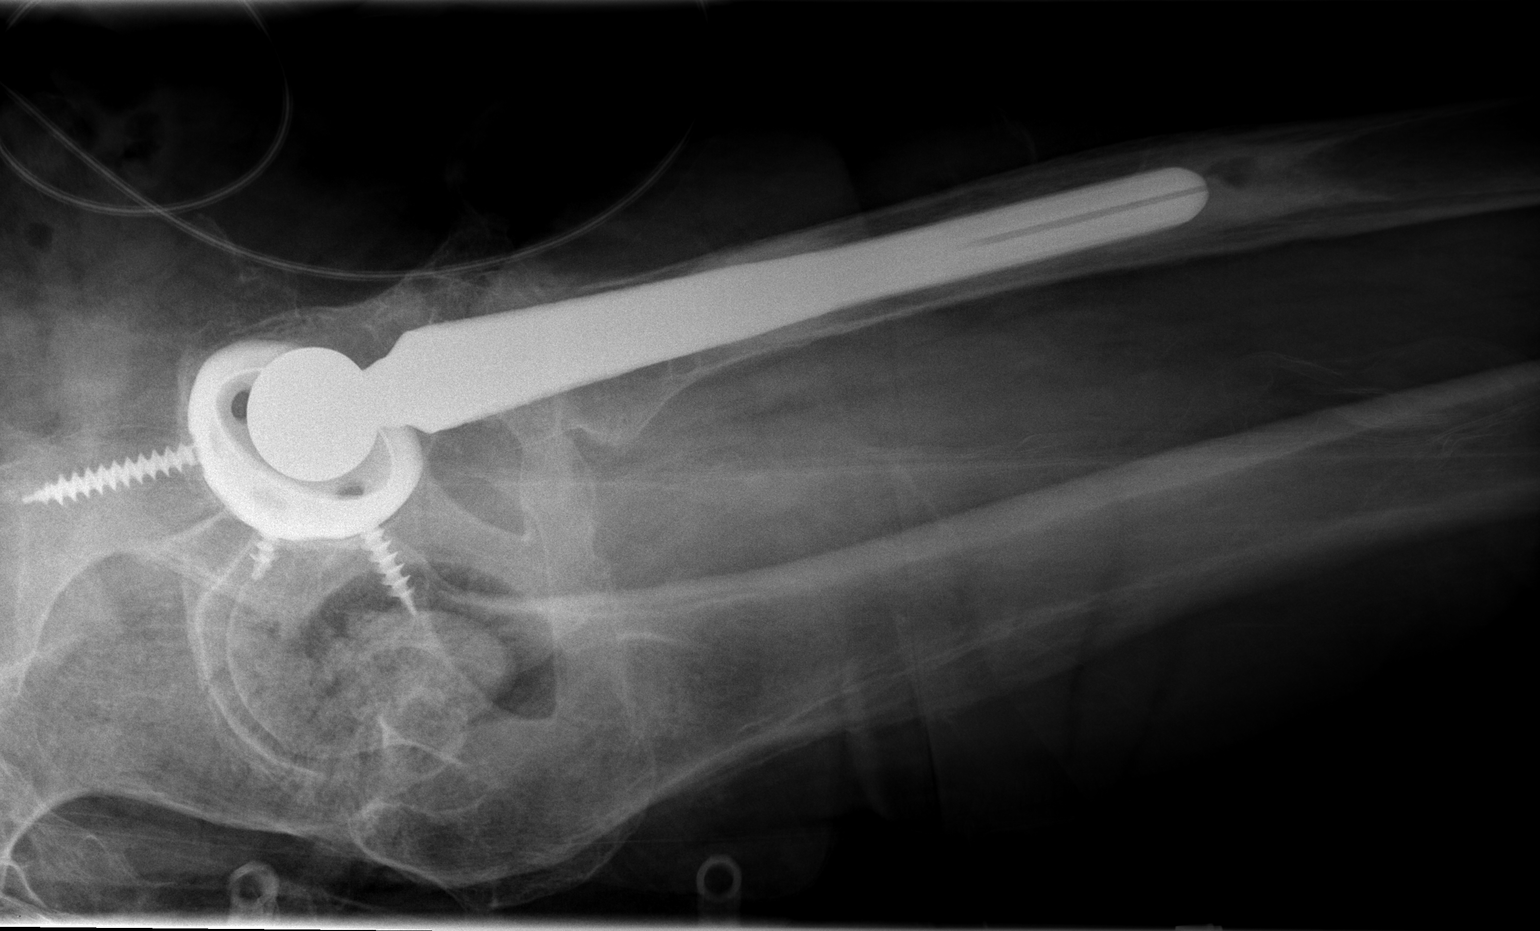

[femur lat]
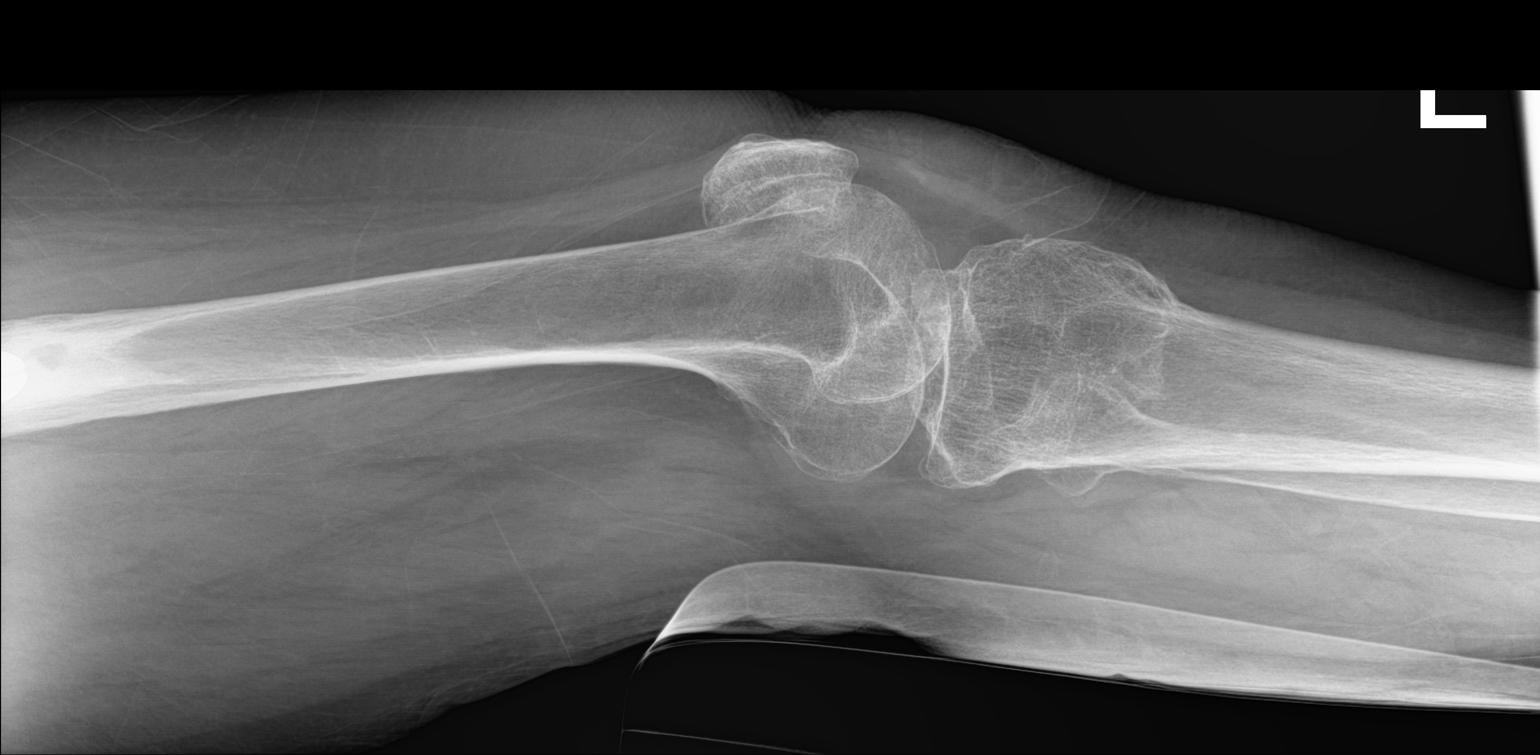

[4 of 4 positions shown; findings below may reference images not displayed]

FINDINGS: Left hip prosthesis is noted. Better visualized on the femur film is
a cortical irregularity just below the intratrochanteric region
suspicious for undisplaced fracture. No other focal abnormality is
noted. Degenerative changes about the knee joint are seen.
IMPRESSION: Cortical irregularity just below the intratrochanteric region
suspicious for undisplaced proximal femoral fracture.

## 2019-01-12 ENCOUNTER — Other Ambulatory Visit: Payer: Self-pay | Admitting: Family Medicine

## 2019-01-12 DIAGNOSIS — K2100 Gastro-esophageal reflux disease with esophagitis, without bleeding: Secondary | ICD-10-CM

## 2019-01-24 ENCOUNTER — Other Ambulatory Visit: Payer: Self-pay | Admitting: Family Medicine

## 2019-01-29 ENCOUNTER — Other Ambulatory Visit: Payer: Self-pay | Admitting: Family Medicine

## 2019-02-19 ENCOUNTER — Other Ambulatory Visit: Payer: Self-pay | Admitting: Family Medicine

## 2019-02-19 NOTE — Telephone Encounter (Signed)
Gottschalk. NTBS 30 days given 01/24/19

## 2019-02-19 NOTE — Telephone Encounter (Signed)
lmtcb to schedule appt 

## 2019-02-23 ENCOUNTER — Other Ambulatory Visit: Payer: Self-pay | Admitting: Family Medicine

## 2019-02-24 ENCOUNTER — Ambulatory Visit: Payer: PPO | Admitting: Family Medicine

## 2019-02-26 ENCOUNTER — Encounter: Payer: Self-pay | Admitting: Family Medicine

## 2019-02-26 ENCOUNTER — Other Ambulatory Visit: Payer: Self-pay

## 2019-02-26 ENCOUNTER — Ambulatory Visit (INDEPENDENT_AMBULATORY_CARE_PROVIDER_SITE_OTHER): Payer: PPO | Admitting: Family Medicine

## 2019-02-26 VITALS — BP 121/63 | HR 63

## 2019-02-26 DIAGNOSIS — K2100 Gastro-esophageal reflux disease with esophagitis, without bleeding: Secondary | ICD-10-CM

## 2019-02-26 DIAGNOSIS — E785 Hyperlipidemia, unspecified: Secondary | ICD-10-CM

## 2019-02-26 DIAGNOSIS — M810 Age-related osteoporosis without current pathological fracture: Secondary | ICD-10-CM | POA: Diagnosis not present

## 2019-02-26 DIAGNOSIS — I10 Essential (primary) hypertension: Secondary | ICD-10-CM

## 2019-02-26 MED ORDER — AMLODIPINE BESYLATE 10 MG PO TABS: 10.0000 mg | ORAL_TABLET | Freq: Every day | ORAL | 3 refills | Status: DC

## 2019-02-26 MED ORDER — ATORVASTATIN CALCIUM 40 MG PO TABS: 40.0000 mg | ORAL_TABLET | Freq: Every day | ORAL | 3 refills | Status: DC

## 2019-02-26 MED ORDER — PANTOPRAZOLE SODIUM 40 MG PO TBEC: 40.0000 mg | DELAYED_RELEASE_TABLET | Freq: Every day | ORAL | 3 refills | Status: DC

## 2019-02-26 NOTE — Progress Notes (Signed)
Telephone visit  Subjective: CC: f/u HTN, HLD, osteoporosis PCP: Raliegh Ip, DO VEH:MCNOBSJ [name] is a 78 y.o. female calls for telephone consult today. Patient provides verbal consent for consult held via phone.  Location of patient: home Location of provider: Working remotely from home Others present for call: none  1. Hypertension/ hyperlipidemia Patient reports good control of blood pressure.  She is compliant with Norvasc 10 mg daily and Lipitor 40 mg.  No chest pain, shortness of breath or dizziness.  Vision is better now that she has had eyelid tack.  2.  GERD Patient reports good control of GERD with Protonix daily.  She notes that this is improved quite a bit since discontinuation of the Lovaza.  No nausea, vomiting, abdominal pain, unplanned weight loss.  No melena or hematochezia.  3.  Osteoporosis Patient is still unsure as whether or not she would like to proceed with Prolia.  She would like to renal bit more about before committing to it.  She tries to incorporate calcium and vitamin D into her diet and take supplements as well.  Medical history significant for multiple fractures and history of hip replacement w/ Dr Darrelyn Hillock.  ROS: Per HPI  Allergies  Allergen Reactions  . Milk-Related Compounds Other (See Comments)    "I almost died"  . Ciprofloxacin Hives and Swelling  . Dexilant [Dexlansoprazole] Hives and Swelling  . Penicillins Other (See Comments)    Childhood allergy.  . Vioxx [Rofecoxib] Hives and Swelling   Past Medical History:  Diagnosis Date  . Arthritis   . Asthma   . Complication of anesthesia    Ileus post hip replacement  . GERD (gastroesophageal reflux disease)   . Hyperlipidemia   . Hypertension   . Urinary tract infection 2012    Current Outpatient Medications:  .  acidophilus (RISAQUAD) CAPS capsule, Take 2 capsules by mouth daily., Disp: , Rfl:  .  amLODipine (NORVASC) 10 MG tablet, TAKE 1 TABLET BY MOUTH EVERY DAY.  Needs  to be seen for further refills., Disp: 30 tablet, Rfl: 0 .  atorvastatin (LIPITOR) 40 MG tablet, Take 1 tablet (40 mg total) by mouth daily. (Needs to be seen before next refill), Disp: 30 tablet, Rfl: 0 .  Cholecalciferol (VITAMIN D3) 2000 UNITS TABS, Take 1 tablet by mouth daily., Disp: , Rfl:  .  Cyanocobalamin (VITAMIN B 12 PO), Take 1 tablet by mouth daily., Disp: , Rfl:  .  Multiple Vitamin (MULTIVITAMIN WITH MINERALS) TABS tablet, Take 2 tablets by mouth daily., Disp: , Rfl:  .  pantoprazole (PROTONIX) 40 MG tablet, TAKE 1 TABLET BY MOUTH EVERY DAY, Disp: 90 tablet, Rfl: 0  Assessment/ Plan: 78 y.o. female   1. Essential hypertension Controlled.  Norvasc renewed.  Follow-up scheduled in September at which point we will plan for fasting labs. - amLODipine (NORVASC) 10 MG tablet; Take 1 tablet (10 mg total) by mouth daily.  Dispense: 90 tablet; Refill: 3  2. Hyperlipidemia with target LDL less than 100 Doing well with Lipitor monotherapy.  Plan for fasting lipid panel at her next visit given discontinuation of Lovaza - atorvastatin (LIPITOR) 40 MG tablet; Take 1 tablet (40 mg total) by mouth daily.  Dispense: 90 tablet; Refill: 3  3. Gastroesophageal reflux disease with esophagitis Under better control now that the omega threes have been discontinued.  Protonix refilled - pantoprazole (PROTONIX) 40 MG tablet; Take 1 tablet (40 mg total) by mouth daily.  Dispense: 90 tablet; Refill: 3  4.  Age-related osteoporosis without current pathological fracture She is still considering Prolia but is not committed to starting the medication yet.  She wants to read a little bit more about this.  I did offer her a possible referral to the osteoporosis specialist in [place].  She will contact me when she is ready to be referred.   Start time: 12:46pm End time: 1:00pm  Total time spent on patient care (including telephone call/ virtual visit): 21 minutes   Hulen SkainsM , DO [facility] [phone]

## 2019-03-07 ENCOUNTER — Encounter: Payer: Self-pay | Admitting: *Deleted

## 2019-03-07 ENCOUNTER — Ambulatory Visit (INDEPENDENT_AMBULATORY_CARE_PROVIDER_SITE_OTHER): Payer: PPO | Admitting: *Deleted

## 2019-03-07 DIAGNOSIS — Z0000 Encounter for general adult medical examination without abnormal findings: Secondary | ICD-10-CM

## 2019-03-07 NOTE — Patient Instructions (Signed)
  [name] , Thank you for taking time to talk with me for your Medicare Wellness Visit. I appreciate your ongoing commitment to your health goals. Please review the following plan we discussed and let me know if I can assist you in the future.   These are the goals we discussed: Goals    . Exercise 150 min/wk Moderate Activity     Walking is a great option.       This is a list of the screening recommended for you and due dates:  Health Maintenance  Topic Date Due  . Pneumonia vaccines (2 of 2 - PPSV23) 05/12/2017  . Flu Shot  04/26/2019  . Tetanus Vaccine  08/20/2023  . Colon Cancer Screening  09/30/2024  . DEXA scan (bone density measurement)  Completed

## 2019-03-07 NOTE — Progress Notes (Signed)
MEDICARE ANNUAL WELLNESS VISIT  03/07/2019  Telephone Visit Disclaimer This Medicare AWV was conducted by telephone due to national recommendations for restrictions regarding the COVID-19 Pandemic (e.g. social distancing).  I verified, using two identifiers, that I am speaking with Deborah LacyPauline C Minardi or their authorized healthcare agent. I discussed the limitations, risks, security, and privacy concerns of performing an evaluation and management service by telephone and the potential availability of an in-person appointment in the future. The patient expressed understanding and agreed to proceed.   Subjective:  Deborah Pruitt is a 78 y.o. female patient of Raliegh IpGottschalk, Ashly M, DO who had a Medicare Annual Wellness Visit today via telephone. 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  She lives with her husband.  She has 2 adult children. She reports that she is socially active and does interact with friends/family regularly.  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   She is minimally physically active, but plans to increase her walking.  Mrs. Wachsmuth enjoys working in her flowers, doing crafts, and reading.  Patient Care Team: Raliegh IpGottschalk, Ashly M, DO as PCP - General (Family Medicine)  Advanced Directives 03/07/2019 02/05/2018 11/28/2017 11/28/2017 06/12/2015 08/17/2014 07/23/2014  Does Patient Have a Medical Advance Directive? Yes Yes Yes No No No No  Type of Estate agentAdvance Directive Healthcare Power of [place];Living will Healthcare Power of [place];Living will Healthcare Power of [place];Living will - - - -  Does patient want to make changes to medical advance directive? No - Patient declined - No - Patient declined - - - -  Copy of Healthcare Power of Attorney in Chart? No - copy requested No - copy requested No - copy requested - - - -  Would patient like information on  creating a medical advance directive? - - - - No - patient declined information No - patient declined information No - patient declined information    Hospital Utilization Over the Past 12 Months: # of hospitalizations or ER visits: 0 # of surgeries: 0  Review of Systems    Patient reports that her overall health is unchanged compared to last year.    Review of Systems:   All systems negative- reported by patient.  Pain Assessment Pain Score: 0-No pain     Current Medications & Allergies (verified) Allergies as of 03/07/2019      Reactions   Milk-related Compounds Other (See Comments)   "I almost died"   Ciprofloxacin Hives, Swelling   Dexilant [dexlansoprazole] Hives, Swelling   Penicillins Other (See Comments)   Childhood allergy.   Vioxx [rofecoxib] Hives, Swelling      Medication List       Accurate as of March 07, 2019 11:11 AM. If you have any questions, ask your nurse or doctor.        acidophilus Caps capsule Take 2 capsules by mouth daily.   amLODipine 10 MG tablet Commonly known as: NORVASC Take 1 tablet (10 mg total) by mouth daily.   atorvastatin 40 MG tablet Commonly known as: LIPITOR Take 1 tablet (40 mg total) by mouth daily.   multivitamin with minerals Tabs tablet Take 2 tablets by mouth daily.   pantoprazole 40 MG tablet Commonly known as: PROTONIX Take 1 tablet (40 mg total) by mouth daily.   VITAMIN B 12 PO Take 1 tablet by mouth daily.   Vitamin D3 50 MCG (2000  UT) Tabs Take 1 tablet by mouth daily.       History (reviewed): Past Medical History:  Diagnosis Date  . Arthritis   . Asthma   . Complication of anesthesia    Ileus post hip replacement  . GERD (gastroesophageal reflux disease)   . Hyperlipidemia   . Hypertension   . Urinary tract infection 2012   Past Surgical History:  Procedure Laterality Date  . CATARACT EXTRACTION W/PHACO Right 07/23/2014   Procedure: CATARACT EXTRACTION PHACO AND INTRAOCULAR LENS  PLACEMENT RIGHT EYE CDE=6.24;  Surgeon: Tonny Branch, MD;  Location: [facility];  Service: Ophthalmology;  Laterality: Right;  . CATARACT EXTRACTION W/PHACO Left 08/17/2014   Procedure: CATARACT EXTRACTION PHACO AND INTRAOCULAR LENS PLACEMENT LEFT EYE;  Surgeon: Tonny Branch, MD;  Location: [facility];  Service: Ophthalmology;  Laterality: Left;  CDE:4.65  . CHOLECYSTECTOMY    . JOINT REPLACEMENT Left    hip  . TOTAL HIP ARTHROPLASTY Left 1999   Family History  Problem Relation Age of Onset  . Stroke Mother 26  . Hypertension Mother   . Heart disease Mother   . Heart disease Father   . Diabetes Father   . Liver cancer Brother   . Lung cancer Brother   . Stomach cancer Maternal Grandmother   . Colon cancer Neg Hx   . Colon polyps Neg Hx   . Esophageal cancer Neg Hx    Social History   Socioeconomic History  . Marital status: Married    Spouse name: Not on file  . Number of children: 2  . Years of education: 59  . Highest education level: Some college, no degree  Occupational History  . Occupation: Retired    Comment: Mining engineer  . Financial resource strain: Not hard at all  . Food insecurity    Worry: Never true    Inability: Never true  . Transportation needs    Medical: No    Non-medical: No  Tobacco Use  . Smoking status: Never Smoker  . Smokeless tobacco: Never Used  Substance and Sexual Activity  . Alcohol use: No  . Drug use: No  . Sexual activity: Yes    Birth control/protection: Post-menopausal  Lifestyle  . Physical activity    Days per week: 7 days    Minutes per session: 10 min  . Stress: Not at all  Relationships  . Social connections    Talks on phone: More than three times a week    Gets together: More than three times a week    Attends religious service: More than 4 times per year    Active member of club or organization: Yes    Attends meetings of clubs or organizations: More than 4 times per year    Relationship status: Married  Other  Topics Concern  . 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 She is adapting to retirement.      She is married with one son and one daughter. No alcohol caffeine or tobacco.   This was updated 07/28/2014    Activities of Daily Living In your present state of health, do you have any difficulty performing the following activities: 03/07/2019 03/07/2019  Hearing? N -  Vision? N N  Difficulty concentrating or making decisions? N -  Walking or climbing stairs? Y -  Comment Due to orthopedic problems -  Dressing or bathing? N -  Doing errands, shopping? N -  Preparing Food and eating ? N -  Using the Toilet? N -  In the past six months, have you accidently leaked urine? N -  Do you have problems with loss of bowel control? N -  Managing your Medications? N -  Managing your Finances? N -  Housekeeping or managing your Housekeeping? N -  Some recent data might be hidden    Patient Literacy    Exercise Current Exercise Habits: Home exercise routine, Type of exercise: stretching, Time (Minutes): 10, Frequency (Times/Week): 7, Weekly Exercise (Minutes/Week): 70, Intensity: Mild, Exercise limited by: orthopedic condition(s)  Diet Patient reports consuming 2 meals a day and 1 snack(s) a day Patient reports that her primary diet is: Regular Patient reports that she does have regular access to food.   Depression Screen PHQ 2/9 Scores 03/07/2019 03/07/2019 08/02/2018 04/03/2018 02/05/2018 02/01/2018 08/23/2017  PHQ - 2 Score 0 0 0 0 0 0 0  PHQ- 9 Score - - 0 - - - -     Fall Risk Fall Risk  03/07/2019 08/02/2018 04/03/2018 02/05/2018 02/01/2018  Falls in the past year? 0 1 Yes Yes Yes  Number falls in past yr: - 0 1 1 1   Injury with Fall? - 1 Yes Yes Yes  Comment - - - - fracture left hip  Risk Factor Category  - - High Fall Risk High Fall Risk -  Risk for fall due to : - - - History of fall(s) -   Follow up - - - Falls prevention discussed -     Objective:  Deborah Pruitt seemed alert and oriented and she participated appropriately during our telephone visit.  Blood Pressure Weight BMI  BP Readings from Last 3 Encounters:  02/26/19 121/63  08/02/18 133/72  04/03/18 (!) 157/76   Wt Readings from Last 3 Encounters:  08/02/18 161 lb (73 kg)  04/03/18 163 lb 12.8 oz (74.3 kg)  02/05/18 165 lb (74.8 kg)   BMI Readings from Last 1 Encounters:  08/02/18 32.52 kg/m    *Unable to obtain current vital signs, weight, and BMI due to telephone visit type  Hearing/Vision  . Deborah Pruitt did not seem to have difficulty with hearing/understanding during the telephone conversation . Reports that she has had a formal eye exam by an eye care professional within the past year . Reports that she has not had a formal hearing evaluation within the past year *Unable to fully assess hearing and vision during telephone visit type  Cognitive Function: 6CIT Screen 03/07/2019  What Year? 0 points  What month? 0 points  What time? 0 points  Count back from 20 0 points  Months in reverse 0 points  Repeat phrase 0 points  Total Score 0   (Normal:0-7, Significant for Dysfunction: >8)  Normal Cognitive Function Screening: Yes   Immunization & Health Maintenance Record Immunization History  Administered Date(s) Administered  . Influenza, High Dose Seasonal PF 08/23/2017, 08/02/2018  . Pneumococcal Conjugate-13 05/12/2016  . Tdap 08/19/2013    Health Maintenance  Topic Date Due  . PNA vac Low Risk Adult (2 of 2 - PPSV23) 05/12/2017  . INFLUENZA VACCINE  04/26/2019  . TETANUS/TDAP  08/20/2023  . COLONOSCOPY  09/30/2024  . DEXA SCAN  Completed       Assessment  This is a routine wellness examination for Deborah Pruitt.  Health Maintenance: Due or Overdue Health Maintenance Due  Topic Date Due  . PNA vac Low Risk Adult (  2 of 2 - PPSV23) 05/12/2017    Deborah Pruitt  does not need a referral for Community Assistance: Care Management:   no Social Work:    no Prescription Assistance:  no Nutrition/Diabetes Education:  no   Plan:  Personalized Goals Goals Addressed            This Visit's Progress   . Exercise 150 min/wk Moderate Activity   Not on track    Walking is a great option.      Personalized Health Maintenance & Screening Recommendations  Pneumococcal vaccine  Screening mammography Advanced directives: has an advanced directive - a copy HAS NOT been provided.  Shingrix vaccine series   Lung Cancer Screening Recommended: no (Low Dose CT Chest recommended if Age 31-80 years, 30 pack-year currently smoking OR have quit w/in past 15 years) Hepatitis C Screening recommended: not applicable    Advanced Directives: Written information was not prepared per patient's request.  Referrals & Orders Mammogram scheduled  Follow-up Plan . Follow-up with Raliegh IpGottschalk, Ashly M, DO as planned . Work on your goal of increasing your exercise- walking is a great option. . At your convenience, please bring a copy of your Advance Directives (Healthcare Power of Attorney and Living Will) to our office to be filed in your medical record.   I have personally reviewed and noted the following in the patient's chart:   . Medical and social history . Use of alcohol, tobacco or illicit drugs  . Current medications and supplements . Functional ability and status . Nutritional status . Physical activity . Advanced directives . List of other physicians . Hospitalizations, surgeries, and ER visits in previous 12 months . Vitals . Screenings to include cognitive, depression, and falls . Referrals and appointments  In addition, I have reviewed and discussed with Deborah Pruitt certain preventive protocols, quality metrics, and best practice recommendations. A written personalized care plan for preventive services as well as general preventive health  recommendations is available and can be mailed to the patient at her request.      Lilia Arguemy Davide Risdon, RN   03/07/2019

## 2019-04-08 DIAGNOSIS — Z1231 Encounter for screening mammogram for malignant neoplasm of breast: Secondary | ICD-10-CM | POA: Diagnosis not present

## 2019-04-12 ENCOUNTER — Encounter: Payer: Self-pay | Admitting: Family Medicine

## 2019-06-24 ENCOUNTER — Other Ambulatory Visit: Payer: Self-pay

## 2019-06-25 ENCOUNTER — Ambulatory Visit (INDEPENDENT_AMBULATORY_CARE_PROVIDER_SITE_OTHER): Payer: PPO | Admitting: Family Medicine

## 2019-06-25 ENCOUNTER — Encounter: Payer: Self-pay | Admitting: Family Medicine

## 2019-06-25 VITALS — BP 126/60 | HR 67 | Temp 98.0°F | Ht 59.0 in | Wt 160.0 lb

## 2019-06-25 DIAGNOSIS — M810 Age-related osteoporosis without current pathological fracture: Secondary | ICD-10-CM | POA: Diagnosis not present

## 2019-06-25 DIAGNOSIS — Z23 Encounter for immunization: Secondary | ICD-10-CM | POA: Diagnosis not present

## 2019-06-25 DIAGNOSIS — E785 Hyperlipidemia, unspecified: Secondary | ICD-10-CM | POA: Diagnosis not present

## 2019-06-25 DIAGNOSIS — I10 Essential (primary) hypertension: Secondary | ICD-10-CM

## 2019-06-25 DIAGNOSIS — K2100 Gastro-esophageal reflux disease with esophagitis, without bleeding: Secondary | ICD-10-CM

## 2019-06-25 NOTE — Addendum Note (Signed)
Addended by: Michaela Corner on: 06/25/2019 10:30 AM   Modules accepted: Orders

## 2019-06-25 NOTE — Patient Instructions (Signed)
You had labs performed today.  You will be contacted with the results of the labs once they are available, usually in the next 3 business days for routine lab work.  If you have an active my chart account, they will be released to your MyChart.  If you prefer to have these labs released to you via telephone, please let us know.  If you had a pap smear or biopsy performed, expect to be contacted in about 7-10 days.  Nurse Jan will reach out to you about the cost, etc for Prolia.   Osteoporosis  Osteoporosis happens when your bones get thin and weak. This can cause your bones to break (fracture) more easily. You can do things at home to make your bones stronger. Follow these instructions at home:  Activity  Exercise as told by your doctor. Ask your doctor what activities are safe for you. You should do: ? Exercises that make your muscles work to hold your body weight up (weight-bearing exercises). These include tai chi, yoga, and walking. ? Exercises to make your muscles stronger. One example is lifting weights. Lifestyle  Limit alcohol intake to no more than 1 drink a day for nonpregnant women and 2 drinks a day for men. One drink equals 12 oz of beer, 5 oz of wine, or 1 oz of hard liquor.  Do not use any products that have nicotine or tobacco in them. These include cigarettes and e-cigarettes. If you need help quitting, ask your doctor. Preventing falls  Use tools to help you move around (mobility aids) as needed. These include canes, walkers, scooters, and crutches.  Keep rooms well-lit and free of clutter.  Put away things that could make you trip. These include cords and rugs.  Install safety rails on stairs. Install grab bars in bathrooms.  Use rubber mats in slippery areas, like bathrooms.  Wear shoes that: ? Fit you well. ? Support your feet. ? Have closed toes. ? Have rubber soles or low heels.  Tell your doctor about all of the medicines you are taking. Some medicines  can make you more likely to fall. General instructions  Eat plenty of calcium and vitamin D. These nutrients are good for your bones. Good sources of calcium and vitamin D include: ? Some fatty fish, such as salmon and tuna. ? Foods that have calcium and vitamin D added to them (fortified foods). For example, some breakfast cereals are fortified with calcium and vitamin D. ? Egg yolks. ? Cheese. ? Liver.  Take over-the-counter and prescription medicines only as told by your doctor.  Keep all follow-up visits as told by your doctor. This is important. Contact a doctor if:  You have not been tested (screened) for osteoporosis and you are: ? A woman who is age 10 or older. ? A man who is age 65 or older. Get help right away if:  You fall.  You get hurt. Summary  Osteoporosis happens when your bones get thin and weak.  Weak bones can break (fracture) more easily.  Eat plenty of calcium and vitamin D. These nutrients are good for your bones.  Tell your doctor about all of the medicines that you take. This information is not intended to replace advice given to you by your health care provider. Make sure you discuss any questions you have with your health care provider. Document Released: 12/04/2011 Document Revised: 08/24/2017 Document Reviewed: 07/06/2017 Elsevier Patient Education  2020 [employer].

## 2019-06-25 NOTE — [facility] Notes (Signed)
Subjective: CC: Follow-up osteoporosis, hypertension and hyperlipidemia PCP: Janora Norlander, DO JSR:PRXYVOP Deborah Pruitt is a 79 y.o. female presenting to clinic today for:  1.  Osteoporosis She reports compliance with OTC vitamin D and calcium.  Again, last DEXA scan showed a T score of -3.3 in June 2019.  She has been on the fence about starting Prolia but if it is affordable she will proceed.  No history of MI or CVA.  2.  Hyperlipidemia Patient was transitioned over to Lipitor from lavage as she was having lower extremity edema with lavage of.  She is tolerated the Lipitor without difficulty.  Denies any myalgia, abdominal pain.  3.  Hypertension Patient reports compliance with Norvasc 10 mg daily.  No chest pain, shortness of breath, lower extremity edema or dizziness.  4.  GERD Patient reports compliance with Protonix.  No abdominal pain, nausea or vomiting.  No Hematochezia or melena.  ROS: Per HPI  Allergies  Allergen Reactions  . Milk-Related Compounds Other (See Comments)    "I almost died"  . Ciprofloxacin Hives and Swelling  . Dexilant [Dexlansoprazole] Hives and Swelling  . Penicillins Other (See Comments)    Childhood allergy.  . Vioxx [Rofecoxib] Hives and Swelling   Past Medical History:  Diagnosis Date  . Arthritis   . Asthma   . Complication of anesthesia    Ileus post hip replacement  . GERD (gastroesophageal reflux disease)   . Hyperlipidemia   . Hypertension   . Urinary tract infection 2012    Current Outpatient Medications:  .  acidophilus (RISAQUAD) CAPS capsule, Take 2 capsules by mouth daily., Disp: , Rfl:  .  amLODipine (NORVASC) 10 MG tablet, Take 1 tablet (10 mg total) by mouth daily., Disp: 90 tablet, Rfl: 3 .  atorvastatin (LIPITOR) 40 MG tablet, Take 1 tablet (40 mg total) by mouth daily., Disp: 90 tablet, Rfl: 3 .  Cholecalciferol (VITAMIN D3) 2000 UNITS TABS, Take 1 tablet by mouth daily., Disp: , Rfl:  .  Cyanocobalamin (VITAMIN  B 12 PO), Take 1 tablet by mouth daily., Disp: , Rfl:  .  Multiple Vitamin (MULTIVITAMIN WITH MINERALS) TABS tablet, Take 2 tablets by mouth daily., Disp: , Rfl:  .  pantoprazole (PROTONIX) 40 MG tablet, Take 1 tablet (40 mg total) by mouth daily., Disp: 90 tablet, Rfl: 3 Social History   Socioeconomic History  . Marital status: Married    Spouse name: Not on file  . Number of children: 2  . Years of education: 27  . Highest education level: Some college, no degree  Occupational History  . Occupation: Retired    Comment: Mining engineer  . Financial resource strain: Not hard at all  . Food insecurity    Worry: Never true    Inability: Never true  . Transportation needs    Medical: No    Non-medical: No  Tobacco Use  . Smoking status: Never Smoker  . Smokeless tobacco: Never Used  Substance and Sexual Activity  . Alcohol use: No  . Drug use: No  . Sexual activity: Yes    Birth control/protection: Post-menopausal  Lifestyle  . Physical activity    Days per week: 7 days    Minutes per session: 10 min  . Stress: Not at all  Relationships  . Social connections    Talks on phone: More than three times a week    Gets together: More than three times a week    Attends religious service:  More than 4 times per year    Active member of club or organization: Yes    Attends meetings of clubs or organizations: More than 4 times per year    Relationship status: Married  . Intimate partner violence    Fear of current or ex partner: No    Emotionally abused: No    Physically abused: No    Forced sexual activity: No  Other Topics Concern  . 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 She is adapting to retirement.      She is married with one son and one daughter. No alcohol caffeine or tobacco.   This was updated 07/28/2014   Family History  Problem Relation Age of Onset   . Stroke Mother 74  . Hypertension Mother   . Heart disease Mother   . Heart disease Father   . Diabetes Father   . Liver cancer Brother   . Lung cancer Brother   . Stomach cancer Maternal Grandmother   . Colon cancer Neg Hx   . Colon polyps Neg Hx   . Esophageal cancer Neg Hx     Objective: Office vital signs reviewed. BP 126/60   Pulse 67   Temp 98 F (36.7 Deborah) (Temporal)   Ht '4\' 11"'$  (1.499 m)   Wt 160 lb (72.6 kg)   SpO2 98%   BMI 32.32 kg/m   Physical Examination:  General: Awake, alert, well nourished, No acute distress HEENT: Normal, sclera white, MMM, no carotid bruits Cardio: regular rate and rhythm, S1S2 heard, no murmurs appreciated Pulm: clear to auscultation bilaterally, no wheezes, rhonchi or rales; normal work of breathing on room air GI:.  Flat, soft, nontender, nondistended.  Bowel sounds present x4.  No hepatomegaly. Extremities: warm, well perfused, No edema, cyanosis or clubbing; +2 pulses bilaterally  Assessment/ Plan: 78 y.o. female   1. Essential hypertension Well-controlled.  Continue Norvasc.  No refills needed - CMP14+EGFR  2. Hyperlipidemia with target LDL less than 100 Check fasting lipid panel, liver function tests - CMP14+EGFR - TSH - Lipid Panel  3. Age-related osteoporosis without current pathological fracture She would like to start Prolia.  We will send this to her insurance to inquire on cost.  Check vitamin D and calcium level. - CMP14+EGFR - VITAMIN D 25 Hydroxy (Vit-D Deficiency, Fractures) - TSH  4. Gastroesophageal reflux disease with esophagitis Well-controlled.  Continue Protonix.  No refills needed   No orders of the defined types were placed in this encounter.  No orders of the defined types were placed in this encounter.    Janora Norlander, DO [place] [phone]

## 2019-06-26 LAB — CMP14+EGFR
ALT: 22 IU/L (ref 0–32)
AST: 25 IU/L (ref 0–40)
Albumin/Globulin Ratio: 2.1 (ref 1.2–2.2)
Albumin: 4.6 g/dL (ref 3.7–4.7)
Alkaline Phosphatase: 75 IU/L (ref 39–117)
BUN/Creatinine Ratio: 8 — ABNORMAL LOW (ref 12–28)
BUN: 8 mg/dL (ref 8–27)
Bilirubin Total: 0.6 mg/dL (ref 0.0–1.2)
CO2: 24 mmol/L (ref 20–29)
Calcium: 9.4 mg/dL (ref 8.7–10.3)
Chloride: 103 mmol/L (ref 96–106)
Creatinine, Ser: 0.96 mg/dL (ref 0.57–1.00)
GFR calc Af Amer: 66 mL/min/{1.73_m2} (ref >59)
GFR calc non Af Amer: 58 mL/min/{1.73_m2} — ABNORMAL LOW (ref >59)
Globulin, Total: 2.2 g/dL (ref 1.5–4.5)
Glucose: 106 mg/dL — ABNORMAL HIGH (ref 65–99)
Potassium: 3.9 mmol/L (ref 3.5–5.2)
Sodium: 142 mmol/L (ref 134–144)
Total Protein: 6.8 g/dL (ref 6.0–8.5)

## 2019-06-26 LAB — LIPID PANEL
Chol/HDL Ratio: 4.1 ratio (ref 0.0–4.4)
Cholesterol, Total: 118 mg/dL (ref 100–199)
HDL: 29 mg/dL — ABNORMAL LOW (ref >39)
LDL Chol Calc (NIH): 55 mg/dL (ref 0–99)
Triglycerides: 207 mg/dL — ABNORMAL HIGH (ref 0–149)
VLDL Cholesterol Cal: 34 mg/dL (ref 5–40)

## 2019-06-26 LAB — TSH: TSH: 0.999 u[IU]/mL (ref 0.450–4.500)

## 2019-06-26 LAB — VITAMIN D 25 HYDROXY (VIT D DEFICIENCY, FRACTURES): Vit D, 25-Hydroxy: 57.8 ng/mL (ref 30.0–100.0)

## 2019-07-29 DIAGNOSIS — H35341 Macular cyst, hole, or pseudohole, right eye: Secondary | ICD-10-CM | POA: Diagnosis not present

## 2019-07-29 DIAGNOSIS — Z961 Presence of intraocular lens: Secondary | ICD-10-CM | POA: Diagnosis not present

## 2019-10-07 ENCOUNTER — Encounter: Payer: Self-pay | Admitting: Family Medicine

## 2019-10-13 ENCOUNTER — Encounter: Payer: Self-pay | Admitting: Family Medicine

## 2019-10-15 ENCOUNTER — Ambulatory Visit (INDEPENDENT_AMBULATORY_CARE_PROVIDER_SITE_OTHER): Payer: PPO | Admitting: Family Medicine

## 2019-10-15 ENCOUNTER — Other Ambulatory Visit: Payer: Self-pay

## 2019-10-15 ENCOUNTER — Encounter: Payer: Self-pay | Admitting: Family Medicine

## 2019-10-15 VITALS — BP 138/70 | HR 65 | Temp 97.5°F | Ht 59.0 in | Wt 158.0 lb

## 2019-10-15 DIAGNOSIS — L989 Disorder of the skin and subcutaneous tissue, unspecified: Secondary | ICD-10-CM | POA: Diagnosis not present

## 2019-10-15 DIAGNOSIS — B079 Viral wart, unspecified: Secondary | ICD-10-CM | POA: Diagnosis not present

## 2019-10-15 NOTE — Patient Instructions (Signed)
Skin Biopsy, Care After This sheet gives you information about how to care for yourself after your procedure. Your health care provider may also give you more specific instructions. If you have problems or questions, contact your health care provider. What can I expect after the procedure? After the procedure, it is common to have:  Soreness.  Bruising.  Itching. Follow these instructions at home: Biopsy site care Follow instructions from your health care provider about how to take care of your biopsy site. Make sure you:  Wash your hands with soap and water before and after you change your bandage (dressing). If soap and water are not available, use hand sanitizer.  Apply ointment on your biopsy site as directed by your health care provider.  Change your dressing as told by your health care provider.  Leave stitches (sutures), skin glue, or adhesive strips in place. These skin closures may need to stay in place for 2 weeks or longer. If adhesive strip edges start to loosen and curl up, you may trim the loose edges. Do not remove adhesive strips completely unless your health care provider tells you to do that.  If the biopsy area bleeds, apply gentle pressure for 10 minutes. Check your biopsy site every day for signs of infection. Check for:  Redness, swelling, or pain.  Fluid or blood.  Warmth.  Pus or a bad smell.  General instructions  Rest and then return to your normal activities as told by your health care provider.  Take over-the-counter and prescription medicines only as told by your health care provider.  Keep all follow-up visits as told by your health care provider. This is important. Contact a health care provider if:  You have redness, swelling, or pain around your biopsy site.  You have fluid or blood coming from your biopsy site.  Your biopsy site feels warm to the touch.  You have pus or a bad smell coming from your biopsy site.  You have a  fever.  Your sutures, skin glue, or adhesive strips loosen or come off sooner than expected. Get help right away if:  You have bleeding that does not stop with pressure or a dressing. Summary  After the procedure, it is common to have soreness, bruising, and itching at the site.  Follow instructions from your health care provider about how to take care of your biopsy site.  Check your biopsy site every day for signs of infection.  Contact a health care provider if you have redness, swelling, or pain around your biopsy site, or your biopsy site feels warm to the touch.  Keep all follow-up visits as told by your health care provider. This is important. This information is not intended to replace advice given to you by your health care provider. Make sure you discuss any questions you have with your health care provider. Document Revised: 03/11/2018 Document Reviewed: 03/11/2018 Elsevier Patient Education  2020 Elsevier Inc.  

## 2019-10-15 NOTE — [facility] Notes (Signed)
Subjective: CC: spot on back PCP: Raliegh Ip, DO BJS:EGBTDVV C Deborah Pruitt is a 79 y.o. female presenting to clinic today for:  1. Spot on back Patient notes that there was a lesion on her right posterior back that started out as a small, flat area.  However, over the last several days it is grown in size and appears to be a "cutaneous horn", which she saw on the Internet.  She denies any pain but does note some redness and irritation.  She is concerned because there is a family history of skin cancer.  She would like this evaluated today.  Not on any blood thinners or antiplatelet medicine.  No history of allergy to lidocaine or epinephrine.   ROS: Per HPI  Allergies  Allergen Reactions  . Milk-Related Compounds Other (See Comments)    "I almost died"  . Ciprofloxacin Hives and Swelling  . Dexilant [Dexlansoprazole] Hives and Swelling  . Penicillins Other (See Comments)    Childhood allergy.  . Vioxx [Rofecoxib] Hives and Swelling   Past Medical History:  Diagnosis Date  . Arthritis   . Asthma   . Complication of anesthesia    Ileus post hip replacement  . GERD (gastroesophageal reflux disease)   . Hyperlipidemia   . Hypertension   . Urinary tract infection 2012    Current Outpatient Medications:  .  acidophilus (RISAQUAD) CAPS capsule, Take 2 capsules by mouth daily., Disp: , Rfl:  .  amLODipine (NORVASC) 10 MG tablet, Take 1 tablet (10 mg total) by mouth daily., Disp: 90 tablet, Rfl: 3 .  atorvastatin (LIPITOR) 40 MG tablet, Take 1 tablet (40 mg total) by mouth daily., Disp: 90 tablet, Rfl: 3 .  Cholecalciferol (VITAMIN D3) 2000 UNITS TABS, Take 1 tablet by mouth daily., Disp: , Rfl:  .  Cyanocobalamin (VITAMIN B 12 PO), Take 1 tablet by mouth daily., Disp: , Rfl:  .  Multiple Vitamin (MULTIVITAMIN WITH MINERALS) TABS tablet, Take 2 tablets by mouth daily., Disp: , Rfl:  .  pantoprazole (PROTONIX) 40 MG tablet, Take 1 tablet (40 mg total) by mouth daily., Disp:  90 tablet, Rfl: 3 Social History   Socioeconomic History  . Marital status: Married    Spouse name: Not on file  . Number of children: 2  . Years of education: 58  . Highest education level: Some college, no degree  Occupational History  . Occupation: Retired    Comment: Airline pilot  Tobacco Use  . Smoking status: Never Smoker  . Smokeless tobacco: Never Used  Substance and Sexual Activity  . Alcohol use: No  . Drug use: No  . Sexual activity: Yes    Birth control/protection: Post-menopausal  Other Topics Concern  . 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      She is married with one son and one daughter. No alcohol caffeine or tobacco.   This was updated 07/28/2014   Social Determinants of Health   Financial Resource Strain:   . Difficulty of Paying Living Expenses: Not on file  Food Insecurity:   . Worried About Programme researcher, broadcasting/film/video in the Last Year: Not on file  . Ran Out of Food in the Last Year: Not on file  Transportation Needs:   . Lack of Transportation (Medical): Not on file  . Lack of Transportation (Non-Medical): Not on file  Physical Activity:  Insufficiently Active  . Days of Exercise per Week: 7 days  . Minutes of Exercise per Session: 10 min  Stress:   . Feeling of Stress : Not on file  Social Connections:   . Frequency of Communication with Friends and Family: Not on file  . Frequency of Social Gatherings with Friends and Family: Not on file  . Attends Religious Services: Not on file  . Active Member of Clubs or Organizations: Not on file  . Attends Archivist Meetings: Not on file  . Marital Status: Not on file  Intimate Partner Violence:   . Fear of Current or Ex-Partner: Not on file  . Emotionally Abused: Not on file  . Physically Abused: Not on file  . Sexually Abused: Not on file   Family History    Problem Relation Age of Onset  . Stroke Mother 38  . Hypertension Mother   . Heart disease Mother   . Heart disease Father   . Diabetes Father   . Liver cancer Brother   . Lung cancer Brother   . Stomach cancer Maternal Grandmother   . Colon cancer Neg Hx   . Colon polyps Neg Hx   . Esophageal cancer Neg Hx     Objective: Office vital signs reviewed. BP 138/70   Pulse 65   Temp (!) 97.5 F (36.4 C) (Oral)   Ht 4\' 11"  (1.499 m)   Wt 158 lb (71.7 kg)   SpO2 98%   BMI 31.91 kg/m   Physical Examination:  General: Awake, alert, well nourished, well appearing female. No acute distress Skin: dry; intact; hyperkeratotic lesion noted along right mid back. Mild surrounding erythema  Skin Biopsy Procedure:  Risks and benefits of procedure were reviewed with the patient.  Written consent scanned into chart.  Area of concern located on right posterior back.  Area was cleaned with alcohol swabs x2.  Local anesthesia was achieved by injecting 0.5 cc of lidocaine 1% with epinephrine.  Skin blade was used to biopsy lesion.  2 cc Estimated blood loss.  Hemostasis achieved with pressure and silver nitrate.  Area was cleaned, antibiotic ointment applied, and a clean bandage was applied.  Patient tolerated procedure well and there were no immediate complications.  Home care instructions were reviewed with the patient.  Assessment/ Plan: 79 y.o. female   1. Skin lesion of back Suspicious for cutaneous horn.  This was excised using a deep shave and sent for pathology.  Home care instructions were reviewed with the patient.  Reasons for return discussed.  She will follow up as needed. - Pathology ([facility])   No orders of the defined types were placed in this encounter.  No orders of the defined types were placed in this encounter.    Janora Norlander, DO [place] [phone]

## 2019-10-17 ENCOUNTER — Encounter: Payer: Self-pay | Admitting: Family Medicine

## 2019-10-17 DIAGNOSIS — B079 Viral wart, unspecified: Secondary | ICD-10-CM | POA: Insufficient documentation

## 2019-10-17 HISTORY — DX: Viral wart, unspecified: B07.9

## 2019-10-17 LAB — ANATOMIC PATHOLOGY REPORT: PDF Image: 0

## 2019-10-17 NOTE — [facility] Notes (Signed)
Left message to please call our office. 

## 2019-12-10 ENCOUNTER — Telehealth: Payer: Self-pay | Admitting: Family Medicine

## 2019-12-10 NOTE — Chronic Care Management (AMB) (Signed)
  Chronic Care Management   Outreach Note  12/10/2019 Name: Deborah Pruitt MRN: [id number] DOB: Nov 30, 1940  Deborah Pruitt is a 79 y.o. year old female who is a primary care patient of Raliegh Ip, DO. I reached out to [employer] by phone today in response to a referral sent by [name]'s health plan.     An unsuccessful telephone outreach was attempted today. The patient was referred to the case management team for assistance with care management and care coordination.   Follow Up Plan: A HIPPA compliant phone message was left for the patient providing contact information and requesting a return call. The care management team will reach out to the patient again over the next 7 days. If patient returns call to provider office, please advise to call Embedded Care Management Care Guide Gwenevere Ghazi at [phone].  Gwenevere Ghazi  Care Guide, Embedded Care Coordination [facility]  [place], [place] [address] Direct Dial: [phone] [email or web address] Website: [email or web address]

## 2019-12-11 NOTE — Chronic Care Management (AMB) (Signed)
  Chronic Care Management   Note  12/11/2019 Name: Deborah Pruitt MRN: [id number] DOB: 03/16/1941  Deborah Pruitt is a 79 y.o. year old female who is a primary care patient of Janora Norlander, DO. I reached out to [employer] by phone today in response to a referral sent by Deborah Pruitt's health plan.     Deborah Pruitt was given information about Chronic Care Management services today including:  1. CCM service includes personalized support from designated clinical staff supervised by her physician, including individualized plan of care and coordination with other care providers 2. 24/7 contact phone numbers for assistance for urgent and routine care needs. 3. Service will only be billed when office clinical staff spend 20 minutes or more in a month to coordinate care. 4. Only one practitioner may furnish and bill the service in a calendar month. 5. The patient may stop CCM services at any time (effective at the end of the month) by phone call to the office staff. 6. The patient will be responsible for cost sharing (co-pay) of up to 20% of the service fee (after annual deductible is met).  Patient agreed to services and verbal consent obtained.   Follow up plan: Telephone appointment with care management team member scheduled for: 05/04/2020.  [place], [place] [address] Direct Dial: [phone] [email or web address]: [email or web address]

## 2020-02-24 ENCOUNTER — Other Ambulatory Visit: Payer: Self-pay | Admitting: Family Medicine

## 2020-02-24 DIAGNOSIS — I10 Essential (primary) hypertension: Secondary | ICD-10-CM

## 2020-02-24 DIAGNOSIS — E785 Hyperlipidemia, unspecified: Secondary | ICD-10-CM

## 2020-03-17 ENCOUNTER — Ambulatory Visit (INDEPENDENT_AMBULATORY_CARE_PROVIDER_SITE_OTHER): Payer: PPO | Admitting: *Deleted

## 2020-03-17 DIAGNOSIS — Z0000 Encounter for general adult medical examination without abnormal findings: Secondary | ICD-10-CM

## 2020-03-17 NOTE — Patient Instructions (Signed)
Preventive Care 38 Years and Older, Female Preventive care refers to lifestyle choices and visits with your health care provider that can promote health and wellness. This includes:  A yearly physical exam. This is also called an annual well check.  Regular dental and eye exams.  Immunizations.  Screening for certain conditions.  Healthy lifestyle choices, such as diet and exercise. What can I expect for my preventive care visit? Physical exam Your health care provider will check:  Height and weight. These may be used to calculate body mass index (BMI), which is a measurement that tells if you are at a healthy weight.  Heart rate and blood pressure.  Your skin for abnormal spots. Counseling Your health care provider may ask you questions about:  Alcohol, tobacco, and drug use.  Emotional well-being.  Home and relationship well-being.  Sexual activity.  Eating habits.  History of falls.  Memory and ability to understand (cognition).  Work and work Statistician.  Pregnancy and menstrual history. What immunizations do I need?  Influenza (flu) vaccine  This is recommended every year. Tetanus, diphtheria, and pertussis (Tdap) vaccine  You may need a Td booster every 10 years. Varicella (chickenpox) vaccine  You may need this vaccine if you have not already been vaccinated. Zoster (shingles) vaccine  You may need this after age 33. Pneumococcal conjugate (PCV13) vaccine  One dose is recommended after age 33. Pneumococcal polysaccharide (PPSV23) vaccine  One dose is recommended after age 72. Measles, mumps, and rubella (MMR) vaccine  You may need at least one dose of MMR if you were born in 1957 or later. You may also need a second dose. Meningococcal conjugate (MenACWY) vaccine  You may need this if you have certain conditions. Hepatitis A vaccine  You may need this if you have certain conditions or if you travel or work in places where you may be exposed  to hepatitis A. Hepatitis B vaccine  You may need this if you have certain conditions or if you travel or work in places where you may be exposed to hepatitis B. Haemophilus influenzae type b (Hib) vaccine  You may need this if you have certain conditions. You may receive vaccines as individual doses or as more than one vaccine together in one shot (combination vaccines). Talk with your health care provider about the risks and benefits of combination vaccines. What tests do I need? Blood tests  Lipid and cholesterol levels. These may be checked every 5 years, or more frequently depending on your overall health.  Hepatitis C test.  Hepatitis B test. Screening  Lung cancer screening. You may have this screening every year starting at age 39 if you have a 30-pack-year history of smoking and currently smoke or have quit within the past 15 years.  Colorectal cancer screening. All adults should have this screening starting at age 36 and continuing until age 15. Your health care provider may recommend screening at age 23 if you are at increased risk. You will have tests every 1-10 years, depending on your results and the type of screening test.  Diabetes screening. This is done by checking your blood sugar (glucose) after you have not eaten for a while (fasting). You may have this done every 1-3 years.  Mammogram. This may be done every 1-2 years. Talk with your health care provider about how often you should have regular mammograms.  BRCA-related cancer screening. This may be done if you have a family history of breast, ovarian, tubal, or peritoneal cancers.  Other tests  Sexually transmitted disease (STD) testing.  Bone density scan. This is done to screen for osteoporosis. You may have this done starting at age 44. Follow these instructions at home: Eating and drinking  Eat a diet that includes fresh fruits and vegetables, whole grains, lean protein, and low-fat dairy products. Limit  your intake of foods with high amounts of sugar, saturated fats, and salt.  Take vitamin and mineral supplements as recommended by your health care provider.  Do not drink alcohol if your health care provider tells you not to drink.  If you drink alcohol: ? Limit how much you have to 0-1 drink a day. ? Be aware of how much alcohol is in your drink. In the [place] one drink equals one 12 oz bottle of beer (355 mL), one 5 oz glass of wine (148 mL), or one 1 oz glass of hard liquor (44 mL). Lifestyle  Take daily care of your teeth and gums.  Stay active. Exercise for at least 30 minutes on 5 or more days each week.  Do not use any products that contain nicotine or tobacco, such as cigarettes, e-cigarettes, and chewing tobacco. If you need help quitting, ask your health care provider.  If you are sexually active, practice safe sex. Use a condom or other form of protection in order to prevent STIs (sexually transmitted infections).  Talk with your health care provider about taking a low-dose aspirin or statin. What's next?  Go to your health care provider once a year for a well check visit.  Ask your health care provider how often you should have your eyes and teeth checked.  Stay up to date on all vaccines. This information is not intended to replace advice given to you by your health care provider. Make sure you discuss any questions you have with your health care provider. Document Revised: 09/05/2018 Document Reviewed: 09/05/2018 Elsevier Patient Education  2020 [employer].

## 2020-03-17 NOTE — [facility] Notes (Signed)
MEDICARE ANNUAL WELLNESS VISIT  03/17/2020  Telephone Visit Disclaimer This Medicare AWV was conducted by telephone due to national recommendations for restrictions regarding the COVID-19 Pandemic (e.g. social distancing).  I verified, using two identifiers, that I am speaking with Deborah Pruitt or their authorized healthcare agent. I discussed the limitations, risks, security, and privacy concerns of performing an evaluation and management service by telephone and the potential availability of an in-person appointment in the future. The patient expressed understanding and agreed to proceed.   Subjective:  Deborah Pruitt is a 79 y.o. female patient of Raliegh Ip, DO who had a Medicare Annual Wellness Visit today via telephone. 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 she has 2 children. she reports that she is socially active and does interact with friends/family regularly. she is minimally physically active and enjoys staying very active in [place] and her community. 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  Patient Care Team: Raliegh Ip, DO as PCP - General (Family Medicine) Gwenith Daily, RN as Registered Nurse  Advanced Directives 03/17/2020 03/07/2019 02/05/2018 11/28/2017 11/28/2017 06/12/2015 08/17/2014  Does Patient Have a Medical Advance Directive? Yes Yes Yes Yes No No No  Type of Estate agent of [place];Living will Healthcare Power of [place];Living will Healthcare Power of [place];Living will Healthcare Power of [place];Living will - - -  Does patient want to make changes to medical advance directive? No - Patient declined No - Patient declined - No - Patient declined - - -  Copy of Healthcare Power of Attorney in Chart? No - copy requested No - copy requested No - copy requested No - copy requested - - -  Would patient like information on creating a  medical advance directive? - - - - - No - patient declined information No - patient declined information    Hospital Utilization Over the Past 12 Months: # of hospitalizations or ER visits: 0 # of surgeries: 0  Review of Systems    Patient reports that her overall health is unchanged compared to last year.  History obtained from chart review  Patient Reported Readings (BP, Pulse, CBG, Weight, etc) none  Pain Assessment Pain : No/denies pain     Current Medications & Allergies (verified) Allergies as of 03/17/2020      Reactions   Milk-related Compounds Other (See Comments)   "I almost died"   Ciprofloxacin Hives, Swelling   Dexilant [dexlansoprazole] Hives, Swelling   Penicillins Other (See Comments)   Childhood allergy.   Vioxx [rofecoxib] Hives, Swelling      Medication List       Accurate as of March 17, 2020  8:53 AM. If you have any questions, ask your nurse or doctor.        acidophilus Caps capsule Take 2 capsules by mouth daily.   amLODipine 10 MG tablet Commonly known as: NORVASC TAKE 1 TABLET BY MOUTH EVERY DAY   atorvastatin 40 MG tablet Commonly known as: LIPITOR TAKE 1 TABLET BY MOUTH EVERY DAY   CALCIUM GUMMIES PO Take by mouth.   multivitamin with minerals Tabs tablet Take 2 tablets by mouth daily.   pantoprazole 40 MG tablet Commonly known as: PROTONIX Take 1 tablet (40 mg total) by mouth daily.   Restasis 0.05 % ophthalmic emulsion Generic drug: cycloSPORINE   VITAMIN B 12 PO Take 1 tablet by mouth daily.   Vitamin  D3 50 MCG (2000 UT) Tabs Take 1 tablet by mouth daily.       History (reviewed): Past Medical History:  Diagnosis Date  . Allergy baby  . Arthritis   . Asthma   . Cataract 2015  . Complication of anesthesia    Ileus post hip replacement  . GERD (gastroesophageal reflux disease)   . Hyperlipidemia   . Hypertension   . Urinary tract infection 2012   Past Surgical History:  Procedure Laterality Date  .  CATARACT EXTRACTION W/PHACO Right 07/23/2014   Procedure: CATARACT EXTRACTION PHACO AND INTRAOCULAR LENS PLACEMENT RIGHT EYE CDE=6.24;  Surgeon: Gemma Payor, MD;  Location: [facility];  Service: Ophthalmology;  Laterality: Right;  . CATARACT EXTRACTION W/PHACO Left 08/17/2014   Procedure: CATARACT EXTRACTION PHACO AND INTRAOCULAR LENS PLACEMENT LEFT EYE;  Surgeon: Gemma Payor, MD;  Location: [facility];  Service: Ophthalmology;  Laterality: Left;  CDE:4.65  . CHOLECYSTECTOMY    . EYE SURGERY  2015, 2019   cataract, eyelids to help vision  . JOINT REPLACEMENT Left    hip  . TOTAL HIP ARTHROPLASTY Left 1999   Family History  Problem Relation Age of Onset  . Stroke Mother 46  . Hypertension Mother   . Heart disease Mother   . Arthritis Mother   . Asthma Mother   . Heart disease Father   . Diabetes Father   . Cancer Father   . Hearing loss Father   . Liver cancer Brother   . Cancer Brother   . Heart disease Brother   . Lung cancer Brother   . Stomach cancer Maternal Grandmother   . Cancer Maternal Grandmother   . Colon cancer Neg Hx   . Colon polyps Neg Hx   . Esophageal cancer Neg Hx    Social History   Socioeconomic History  . Marital status: Married    Spouse name: Gala Romney  . Number of children: 2  . Years of education: 19  . Highest education level: Some college, no degree  Occupational History  . Occupation: Retired    Comment: Airline pilot  Tobacco Use  . Smoking status: Never Smoker  . Smokeless tobacco: Never Used  Vaping Use  . Vaping Use: Never used  Substance and Sexual Activity  . Alcohol use: No  . Drug use: No  . Sexual activity: Yes    Birth control/protection: Post-menopausal  Other Topics Concern  . 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      She is married with one son and one daughter. No alcohol caffeine or tobacco.   This was  updated 07/28/2014   Social Determinants of Health   Financial Resource Strain: Low Risk   . Difficulty of Paying Living Expenses: Not hard at all  Food Insecurity: No Food Insecurity  . Worried About Programme researcher, broadcasting/film/video in the Last Year: Never true  . Ran Out of Food in the Last Year: Never true  Transportation Needs: No Transportation Needs  . Lack of Transportation (Medical): No  . Lack of Transportation (Non-Medical): No  Physical Activity: Sufficiently Active  . Days of Exercise per Week: 5 days  . Minutes of Exercise per Session: 30 min  Stress: No Stress Concern Present  . Feeling of Stress : Not at all  Social Connections: Socially Integrated  . Frequency of Communication with Friends and Family: More than  three times a week  . Frequency of Social Gatherings with Friends and Family: More than three times a week  . Attends Religious Services: More than 4 times per year  . Active Member of Clubs or Organizations: Yes  . Attends Banker Meetings: More than 4 times per year  . Marital Status: Married    Activities of Daily Living In your present state of health, do you have any difficulty performing the following activities: 03/17/2020  Hearing? N  Vision? Y  Comment wears rx glasses-trouble with night vision-dry eyes-last eye exam 07/2019  Difficulty concentrating or making decisions? N  Walking or climbing stairs? N  Dressing or bathing? N  Doing errands, shopping? N  Preparing Food and eating ? N  Using the Toilet? N  In the past six months, have you accidently leaked urine? N  Do you have problems with loss of bowel control? N  Managing your Medications? N  Managing your Finances? N  Housekeeping or managing your Housekeeping? N  Some recent data might be hidden    Patient Education/ Literacy How often do you need to have someone help you when you read instructions, pamphlets, or other written materials from your doctor or pharmacy?: 1 - Never What  is the last grade level you completed in school?: Some College  Exercise Current Exercise Habits: Home exercise routine, Type of exercise: stretching;walking, Time (Minutes): 30, Frequency (Times/Week): 5, Weekly Exercise (Minutes/Week): 150, Intensity: Mild, Exercise limited by: None identified  Diet Patient reports consuming 2 meals a day and 1 snack(s) a day Patient reports that her primary diet is: Regular Patient reports that she does have regular access to food.   Depression Screen PHQ 2/9 Scores 03/17/2020 10/15/2019 06/25/2019 03/07/2019 03/07/2019 08/02/2018 04/03/2018  PHQ - 2 Score 0 0 0 0 0 0 0  PHQ- 9 Score - 0 0 - - 0 -     Fall Risk Fall Risk  03/17/2020 10/15/2019 06/25/2019 03/07/2019 08/02/2018  Falls in the past year? 0 0 0 0 1  Number falls in past yr: - - - - 0  Injury with Fall? - - - - 1  Comment - - - - -  Risk Factor Category  - - - - -  Risk for fall due to : - - - - -  Follow up - - - - -     Objective:  Deborah Pruitt seemed alert and oriented and she participated appropriately during our telephone visit.  Blood Pressure Weight BMI  BP Readings from Last 3 Encounters:  10/15/19 138/70  06/25/19 126/60  02/26/19 121/63   Wt Readings from Last 3 Encounters:  10/15/19 158 lb (71.7 kg)  06/25/19 160 lb (72.6 kg)  08/02/18 161 lb (73 kg)   BMI Readings from Last 1 Encounters:  10/15/19 31.91 kg/m    *Unable to obtain current vital signs, weight, and BMI due to telephone visit type  Hearing/Vision  . Betheny did not seem to have difficulty with hearing/understanding during the telephone conversation . Reports that she has had a formal eye exam by an eye care professional within the past year . Reports that she has not had a formal hearing evaluation within the past year *Unable to fully assess hearing and vision during telephone visit type  Cognitive Function: 6CIT Screen 03/17/2020 03/07/2019  What Year? 0 points 0 points  What month? 0 points 0  points  What time? 0 points 0 points  Count back from 20  0 points 0 points  Months in reverse 0 points 0 points  Repeat phrase 0 points 0 points  Total Score 0 0   (Normal:0-7, Significant for Dysfunction: >8)  Normal Cognitive Function Screening: Yes   Immunization & Health Maintenance Record Immunization History  Administered Date(s) Administered  . Fluad Quad(high Dose 65+) 06/25/2019  . Influenza, High Dose Seasonal PF 08/23/2017, 08/02/2018  . Pneumococcal Conjugate-13 05/12/2016  . Pneumococcal Polysaccharide-23 06/25/2019  . Tdap 08/19/2013    Health Maintenance  Topic Date Due  . Hepatitis C Screening  Never done  . COVID-19 Vaccine (1) Never done  . INFLUENZA VACCINE  04/25/2020  . TETANUS/TDAP  08/20/2023  . DEXA SCAN  Completed  . PNA vac Low Risk Adult  Completed       Assessment  This is a routine wellness examination for JAEDAN SCHUMAN.  Health Maintenance: Due or Overdue Health Maintenance Due  Topic Date Due  . Hepatitis C Screening  Never done  . COVID-19 Vaccine (1) Never done    Deborah Pruitt does not need a referral for Community Assistance: Care Management:   no Social Work:    no Prescription Assistance:  no Nutrition/Diabetes Education:  no   Plan:  Personalized Goals Goals Addressed            This Visit's Progress   . DIET - INCREASE WATER INTAKE       Try to drink 6-8 glasses of water daily.      Personalized Health Maintenance & Screening Recommendations  Advanced directives: has an advanced directive - a copy HAS NOT been provided. Shingrix vaccine  Lung Cancer Screening Recommended: no (Low Dose CT Chest recommended if Age 22-80 years, 30 pack-year currently smoking OR have quit w/in past 15 years) Hepatitis C Screening recommended: no HIV Screening recommended: no  Advanced Directives: Written information was not prepared per patient's request.  Referrals & Orders No orders of the defined types were  placed in this encounter.   Follow-up Plan . Follow-up with Janora Norlander, DO as planned . Consider getting your Shingrix vaccine at your local pharmacy due to insurance . Bring a copy of your COVID vaccine card into our office for our records . Bring a copy of your Advanced Directives in for our records   I have personally reviewed and noted the following in the patient's chart:   . Medical and social history . Use of alcohol, tobacco or illicit drugs  . Current medications and supplements . Functional ability and status . Nutritional status . Physical activity . Advanced directives . List of other physicians . Hospitalizations, surgeries, and ER visits in previous 12 months . Vitals . Screenings to include cognitive, depression, and falls . Referrals and appointments  In addition, I have reviewed and discussed with Deborah Pruitt certain preventive protocols, quality metrics, and best practice recommendations. A written personalized care plan for preventive services as well as general preventive health recommendations is available and can be mailed to the patient at her request.      Milas Hock, LPN  [id number]

## 2020-04-11 ENCOUNTER — Other Ambulatory Visit: Payer: Self-pay | Admitting: Family Medicine

## 2020-04-11 DIAGNOSIS — K2100 Gastro-esophageal reflux disease with esophagitis, without bleeding: Secondary | ICD-10-CM

## 2020-04-19 ENCOUNTER — Encounter: Payer: Self-pay | Admitting: Family Medicine

## 2020-05-04 ENCOUNTER — Telehealth: Payer: PPO | Admitting: *Deleted

## 2020-05-04 ENCOUNTER — Telehealth: Payer: Self-pay | Admitting: *Deleted

## 2020-05-04 NOTE — Telephone Encounter (Signed)
  Chronic Care Management   Outreach Note  05/04/2020 Name: Deborah Pruitt MRN: [id number] DOB: 11-04-1940  Referred by: Raliegh Ip, DO Reason for referral : Chronic Care Management (Initial Visit)   An unsuccessful Initial Telephone outreach was attempted today. The patient was referred to the case management team for assistance with care management and care coordination.   Clinical Goals: . Over the next 10 days, patient will be contacted by a Care Guide to reschedule their Initial CCM Visit . Over the next 30 days, patient will have an Initial CCM Visit with a member of the embedded CCM team to discuss self-management of their chronic medical conditions  Interventions and Plan . Chart reviewed in preparation for initial visit telephone call . Collaboration with other care team members as needed . Unsuccessful outreach to patient  . A HIPAA compliant phone message was left for the patient providing contact information and requesting a return call.  . Request sent to care guides to reach out and reschedule patient's initial visit   Demetrios Loll, BSN, RN-BC Embedded Chronic Care Manager [facility] Care Management Direct Dial: [phone]

## 2020-05-05 ENCOUNTER — Other Ambulatory Visit: Payer: Self-pay | Admitting: Family Medicine

## 2020-05-05 DIAGNOSIS — K2100 Gastro-esophageal reflux disease with esophagitis, without bleeding: Secondary | ICD-10-CM

## 2020-05-19 ENCOUNTER — Telehealth: Payer: Self-pay

## 2020-05-19 NOTE — Chronic Care Management (AMB) (Signed)
  Care Management   Note  05/19/2020 Name: [name] MRN: [id number] DOB: April 28, 1941  Deborah Pruitt is a 79 y.o. year old female who is a primary care patient of Raliegh Ip, DO and is actively engaged with the care management team. I reached out to Warren Lacy by phone today to assist with re-scheduling an initial visit with the RN Case Manager  Follow up plan: Unsuccessful telephone outreach attempt made. A HIPPA compliant phone message was left for the patient providing contact information and requesting a return call.  The care management team will reach out to the patient again over the next 7 days.  If patient returns call to provider office, please advise to call Embedded Care Management Care Guide Penne Lash  at [phone]  Penne Lash, RMA Care Guide, Embedded Care Coordination [facility]  [place], [place] [address] Direct Dial: [phone] [email or web address]: [email or web address]

## 2020-05-21 ENCOUNTER — Other Ambulatory Visit: Payer: Self-pay | Admitting: Family Medicine

## 2020-05-21 DIAGNOSIS — E785 Hyperlipidemia, unspecified: Secondary | ICD-10-CM

## 2020-05-21 DIAGNOSIS — I10 Essential (primary) hypertension: Secondary | ICD-10-CM

## 2020-05-24 ENCOUNTER — Encounter: Payer: Self-pay | Admitting: Family Medicine

## 2020-05-24 ENCOUNTER — Other Ambulatory Visit: Payer: Self-pay

## 2020-05-24 ENCOUNTER — Ambulatory Visit (INDEPENDENT_AMBULATORY_CARE_PROVIDER_SITE_OTHER): Payer: PPO | Admitting: Family Medicine

## 2020-05-24 VITALS — BP 148/85 | HR 68 | Temp 98.0°F | Ht 59.0 in | Wt 159.0 lb

## 2020-05-24 DIAGNOSIS — E785 Hyperlipidemia, unspecified: Secondary | ICD-10-CM | POA: Diagnosis not present

## 2020-05-24 DIAGNOSIS — K2100 Gastro-esophageal reflux disease with esophagitis, without bleeding: Secondary | ICD-10-CM

## 2020-05-24 DIAGNOSIS — I10 Essential (primary) hypertension: Secondary | ICD-10-CM

## 2020-05-24 DIAGNOSIS — M810 Age-related osteoporosis without current pathological fracture: Secondary | ICD-10-CM

## 2020-05-24 MED ORDER — RISEDRONATE SODIUM 150 MG PO TABS: 150.0000 mg | ORAL_TABLET | ORAL | 3 refills | Status: DC

## 2020-05-24 MED ORDER — AMLODIPINE BESYLATE 10 MG PO TABS: 10.0000 mg | ORAL_TABLET | Freq: Every day | ORAL | 3 refills | Status: DC

## 2020-05-24 MED ORDER — ATORVASTATIN CALCIUM 40 MG PO TABS: 40.0000 mg | ORAL_TABLET | Freq: Every day | ORAL | 3 refills | Status: DC

## 2020-05-24 MED ORDER — PANTOPRAZOLE SODIUM 40 MG PO TBEC: 40.0000 mg | DELAYED_RELEASE_TABLET | Freq: Every day | ORAL | 3 refills | Status: DC

## 2020-05-24 NOTE — [facility] Notes (Signed)
Subjective: CC: Follow-up osteoporosis, hypertension and hyperlipidemia PCP: Janora Norlander, DO Deborah Pruitt is a 79 y.o. female presenting to clinic today for:  1.  Osteoporosis She reports compliance with OTC vitamin D and calcium.  T score of -3.3 in June 2019.  She has history of left-sided hip replacement.  She would like to consider medication for osteoporosis now.  2.  Hyperlipidemia Compliant with Lipitor 40 mg daily.  No abdominal pain, nausea or vomiting.  3.  Hypertension Patient reports compliance with Norvasc 10 mg daily.  No dizziness, chest pain, shortness of breath, headache or visual disturbance  4.  GERD Patient reports compliance with Protonix.  No abdominal pain, nausea or vomiting.  She does have occasional loose stools but no overt diarrhea.  The seem to be related to what she is eating at the time. ROS: Per HPI  Allergies  Allergen Reactions  . Milk-Related Compounds Other (See Comments)    "I almost died"  . Ciprofloxacin Hives and Swelling  . Dexilant [Dexlansoprazole] Hives and Swelling  . Penicillins Other (See Comments)    Childhood allergy.  . Vioxx [Rofecoxib] Hives and Swelling   Past Medical History:  Diagnosis Date  . Allergy baby  . Arthritis   . Asthma   . Cataract 2015  . Complication of anesthesia    Ileus post hip replacement  . GERD (gastroesophageal reflux disease)   . Hyperlipidemia   . Hypertension   . Urinary tract infection 2012    Current Outpatient Medications:  .  acidophilus (RISAQUAD) CAPS capsule, Take 2 capsules by mouth daily., Disp: , Rfl:  .  amLODipine (NORVASC) 10 MG tablet, TAKE 1 TABLET BY MOUTH EVERY DAY, Disp: 90 tablet, Rfl: 0 .  atorvastatin (LIPITOR) 40 MG tablet, TAKE 1 TABLET BY MOUTH EVERY DAY, Disp: 90 tablet, Rfl: 0 .  Calcium-Phosphorus-Vitamin D (CALCIUM GUMMIES PO), Take by mouth., Disp: , Rfl:  .  Cholecalciferol (VITAMIN D3) 2000 UNITS TABS, Take 1 tablet by mouth daily., Disp:  , Rfl:  .  Cyanocobalamin (VITAMIN B 12 PO), Take 1 tablet by mouth daily., Disp: , Rfl:  .  Multiple Vitamin (MULTIVITAMIN WITH MINERALS) TABS tablet, Take 2 tablets by mouth daily., Disp: , Rfl:  .  pantoprazole (PROTONIX) 40 MG tablet, TAKE 1 TABLET (40 MG TOTAL) BY MOUTH DAILY. (NEEDS TO BE SEEN BEFORE NEXT REFILL), Disp: 30 tablet, Rfl: 0 .  RESTASIS 0.05 % ophthalmic emulsion, , Disp: , Rfl:  Social History   Socioeconomic History  . Marital status: Married    Spouse name: Marden Noble  . Number of children: 2  . Years of education: 33  . Highest education level: Some college, no degree  Occupational History  . Occupation: Retired    Comment: Optometrist  Tobacco Use  . Smoking status: Never Smoker  . Smokeless tobacco: Never Used  Vaping Use  . Vaping Use: Never used  Substance and Sexual Activity  . Alcohol use: No  . Drug use: No  . Sexual activity: Yes    Birth control/protection: Post-menopausal  Other Topics Concern  . 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 She is adapting to retirement.      She is married with one son and one daughter. No alcohol caffeine or tobacco.   This was updated 07/28/2014   Social Determinants of Health   Financial Resource Strain: Low Risk   .  Difficulty of Paying Living Expenses: Not hard at all  Food Insecurity: No Food Insecurity  . Worried About Charity fundraiser in the Last Year: Never true  . Ran Out of Food in the Last Year: Never true  Transportation Needs: No Transportation Needs  . Lack of Transportation (Medical): No  . Lack of Transportation (Non-Medical): No  Physical Activity: Sufficiently Active  . Days of Exercise per Week: 5 days  . Minutes of Exercise per Session: 30 min  Stress: No Stress Concern Present  . Feeling of Stress : Not at all  Social Connections: Socially Integrated  . Frequency of Communication with  Friends and Family: More than three times a week  . Frequency of Social Gatherings with Friends and Family: More than three times a week  . Attends Religious Services: More than 4 times per year  . Active Member of Clubs or Organizations: Yes  . Attends Archivist Meetings: More than 4 times per year  . Marital Status: Married  Human resources officer Violence: Not At Risk  . Fear of Current or Ex-Partner: No  . Emotionally Abused: No  . Physically Abused: No  . Sexually Abused: No   Family History  Problem Relation Age of Onset  . Stroke Mother 52  . Hypertension Mother   . Heart disease Mother   . Arthritis Mother   . Asthma Mother   . Heart disease Father   . Diabetes Father   . Cancer Father   . Hearing loss Father   . Liver cancer Brother   . Cancer Brother   . Heart disease Brother   . Lung cancer Brother   . Stomach cancer Maternal Grandmother   . Cancer Maternal Grandmother   . Colon cancer Neg Hx   . Colon polyps Neg Hx   . Esophageal cancer Neg Hx     Objective: Office vital signs reviewed. BP (!) 148/85   Pulse 68   Temp 98 F (36.7 C)   Ht _0  (1.499 m)   Wt 159 lb (72.1 kg)   SpO2 95%   BMI 32.11 kg/m   Physical Examination:  General: Awake, alert, well nourished, No acute distress HEENT: Normal, sclera white, MMM, no carotid bruits Cardio: regular rate and rhythm, S1S2 heard, no murmurs appreciated Pulm: clear to auscultation bilaterally, no wheezes, rhonchi or rales; normal work of breathing on room air GI:.  Flat, soft, nontender, nondistended.  Bowel sounds present x4.  No hepatomegaly. Extremities: warm, well perfused, No edema, cyanosis or clubbing; +2 pulses bilaterally  Assessment/ Plan: 79 y.o. female   1. Essential hypertension Controlled at home.  Elevation likely situational.  Continue current regimen - CMP14+EGFR - amLODipine (NORVASC) 10 MG tablet; Take 1 tablet (10 mg total) by mouth daily.  Dispense: 90 tablet; Refill:  3  2. Hyperlipidemia with target LDL less than 100 Check fasting lipid panel - CMP14+EGFR - Lipid Panel - TSH - atorvastatin (LIPITOR) 40 MG tablet; Take 1 tablet (40 mg total) by mouth daily.  Dispense: 90 tablet; Refill: 3  3. Age-related osteoporosis without current pathological fracture Check vitamin D, calcium levels.  We discussed therapies including zoledronic acid, Prolia and oral bisphosphonates.  Will trial oral bisphosphonate.  Considered Boniva but this is not covered.  Will place order for Actonel instead once monthly. - CMP14+EGFR - CBC - VITAMIN D 25 Hydroxy (Vit-D Deficiency, Fractures) - risedronate (ACTONEL) 150 MG tablet; Take 1 tablet (150 mg total) by mouth every 30 (thirty)  days. with water on empty stomach, nothing by mouth or lie down for next 30 minutes. Inform Boniva not covered.  Dispense: 3 tablet; Refill: 3 - DG [facility] DEXA; Future  4. Gastroesophageal reflux disease with esophagitis without hemorrhage Stable. - CBC - pantoprazole (PROTONIX) 40 MG tablet; Take 1 tablet (40 mg total) by mouth daily.  Dispense: 90 tablet; Refill: 3   No orders of the defined types were placed in this encounter.  No orders of the defined types were placed in this encounter.    Janora Norlander, DO [facility] [phone]

## 2020-05-24 NOTE — Patient Instructions (Signed)
You had labs performed today.  You will be contacted with the results of the labs once they are available, usually in the next 3 business days for routine lab work.  If you have an active my chart account, they will be released to your MyChart.  If you prefer to have these labs released to you via telephone, please let us know.  If you had a pap smear or biopsy performed, expect to be contacted in about 7-10 days.   Osteoporosis  Osteoporosis happens when your bones get thin and weak. This can cause your bones to break (fracture) more easily. You can do things at home to make your bones stronger. Follow these instructions at home:  Activity  Exercise as told by your doctor. Ask your doctor what activities are safe for you. You should do: ? Exercises that make your muscles work to hold your body weight up (weight-bearing exercises). These include tai chi, yoga, and walking. ? Exercises to make your muscles stronger. One example is lifting weights. Lifestyle  Limit alcohol intake to no more than 1 drink a day for nonpregnant women and 2 drinks a day for men. One drink equals 12 oz of beer, 5 oz of wine, or 1 oz of hard liquor.  Do not use any products that have nicotine or tobacco in them. These include cigarettes and e-cigarettes. If you need help quitting, ask your doctor. Preventing falls  Use tools to help you move around (mobility aids) as needed. These include canes, walkers, scooters, and crutches.  Keep rooms well-lit and free of clutter.  Put away things that could make you trip. These include cords and rugs.  Install safety rails on stairs. Install grab bars in bathrooms.  Use rubber mats in slippery areas, like bathrooms.  Wear shoes that: ? Fit you well. ? Support your feet. ? Have closed toes. ? Have rubber soles or low heels.  Tell your doctor about all of the medicines you are taking. Some medicines can make you more likely to fall. General instructions  Eat  plenty of calcium and vitamin D. These nutrients are good for your bones. Good sources of calcium and vitamin D include: ? Some fatty fish, such as salmon and tuna. ? Foods that have calcium and vitamin D added to them (fortified foods). For example, some breakfast cereals are fortified with calcium and vitamin D. ? Egg yolks. ? Cheese. ? Liver.  Take over-the-counter and prescription medicines only as told by your doctor.  Keep all follow-up visits as told by your doctor. This is important. Contact a doctor if:  You have not been tested (screened) for osteoporosis and you are: ? A woman who is age 37 or older. ? A man who is age 76 or older. Get help right away if:  You fall.  You get hurt. Summary  Osteoporosis happens when your bones get thin and weak.  Weak bones can break (fracture) more easily.  Eat plenty of calcium and vitamin D. These nutrients are good for your bones.  Tell your doctor about all of the medicines that you take. This information is not intended to replace advice given to you by your health care provider. Make sure you discuss any questions you have with your health care provider. Document Revised: 08/24/2017 Document Reviewed: 07/06/2017 Elsevier Patient Education  2020 [employer].

## 2020-05-25 LAB — CMP14+EGFR
ALT: 18 IU/L (ref 0–32)
AST: 17 IU/L (ref 0–40)
Albumin/Globulin Ratio: 1.9 (ref 1.2–2.2)
Albumin: 4.6 g/dL (ref 3.7–4.7)
Alkaline Phosphatase: 75 IU/L (ref 48–121)
BUN/Creatinine Ratio: 13 (ref 12–28)
BUN: 12 mg/dL (ref 8–27)
Bilirubin Total: 0.7 mg/dL (ref 0.0–1.2)
CO2: 24 mmol/L (ref 20–29)
Calcium: 9.4 mg/dL (ref 8.7–10.3)
Chloride: 105 mmol/L (ref 96–106)
Creatinine, Ser: 0.96 mg/dL (ref 0.57–1.00)
GFR calc Af Amer: 66 mL/min/{1.73_m2} (ref >59)
GFR calc non Af Amer: 58 mL/min/{1.73_m2} — ABNORMAL LOW (ref >59)
Globulin, Total: 2.4 g/dL (ref 1.5–4.5)
Glucose: 93 mg/dL (ref 65–99)
Potassium: 3.7 mmol/L (ref 3.5–5.2)
Sodium: 143 mmol/L (ref 134–144)
Total Protein: 7 g/dL (ref 6.0–8.5)

## 2020-05-25 LAB — LIPID PANEL
Chol/HDL Ratio: 3.8 ratio (ref 0.0–4.4)
Cholesterol, Total: 107 mg/dL (ref 100–199)
HDL: 28 mg/dL — ABNORMAL LOW (ref >39)
LDL Chol Calc (NIH): 46 mg/dL (ref 0–99)
Triglycerides: 202 mg/dL — ABNORMAL HIGH (ref 0–149)
VLDL Cholesterol Cal: 33 mg/dL (ref 5–40)

## 2020-05-25 LAB — VITAMIN D 25 HYDROXY (VIT D DEFICIENCY, FRACTURES): Vit D, 25-Hydroxy: 61.8 ng/mL (ref 30.0–100.0)

## 2020-05-25 LAB — CBC
Hematocrit: 36 % (ref 34.0–46.6)
Hemoglobin: 12.9 g/dL (ref 11.1–15.9)
MCH: 32.3 pg (ref 26.6–33.0)
MCHC: 35.8 g/dL — ABNORMAL HIGH (ref 31.5–35.7)
MCV: 90 fL (ref 79–97)
Platelets: 222 10*3/uL (ref 150–450)
RBC: 3.99 x10E6/uL (ref 3.77–5.28)
RDW: 13 % (ref 11.7–15.4)
WBC: 5.5 10*3/uL (ref 3.4–10.8)

## 2020-05-25 LAB — TSH: TSH: 1.24 u[IU]/mL (ref 0.450–4.500)

## 2020-06-08 NOTE — Chronic Care Management (AMB) (Signed)
°  Care Management   Note  06/08/2020 Name: Deborah Pruitt MRN: [id number] DOB: Sep 03, 1941  Deborah Pruitt is a 79 y.o. year old female who is a primary care patient of Raliegh Ip, DO and is actively engaged with the care management team. I reached out to Deborah Pruitt by phone today to assist with re-scheduling an initial visit with the RN Case Manager  Follow up plan: Unsuccessful telephone outreach attempt made. A HIPPA compliant phone message was left for the patient providing contact information and requesting a return call.  The care management team will reach out to the patient again over the next 7 days.  If patient returns call to provider office, please advise to call Embedded Care Management Care Guide Deborah Pruitt  at [phone]  Deborah Pruitt, RMA Care Guide, Embedded Care Coordination [facility]  [place], [place] [address] Direct Dial: [phone] [email or web address] Website: [email or web address]

## 2020-06-21 NOTE — Telephone Encounter (Signed)
Pt does not wish to r/s

## 2020-06-21 NOTE — Chronic Care Management (AMB) (Signed)
°  Care Management   Note  06/21/2020 Name: Deborah Pruitt MRN: [id number] DOB: [birth year]  Deborah Pruitt is a 79 y.o. year old female who is a primary care patient of Raliegh Ip, DO and is actively engaged with the care management team. I reached out to Deborah Pruitt by phone today to assist with re-scheduling an initial visit with the RN Case Manager  Follow up plan: Patient declines further follow up and engagement by the care management team. Appropriate care team members and provider have been notified via electronic communication.   Deborah Pruitt, RMA Care Guide, Embedded Care Coordination [facility]  [place], [place] [address] Direct Dial: [phone] [email or web address] Website: [email or web address]

## 2020-07-29 DIAGNOSIS — H40013 Open angle with borderline findings, low risk, bilateral: Secondary | ICD-10-CM | POA: Diagnosis not present

## 2020-07-29 DIAGNOSIS — H35341 Macular cyst, hole, or pseudohole, right eye: Secondary | ICD-10-CM | POA: Diagnosis not present

## 2020-07-29 DIAGNOSIS — Z961 Presence of intraocular lens: Secondary | ICD-10-CM | POA: Diagnosis not present

## 2020-07-29 DIAGNOSIS — H16223 Keratoconjunctivitis sicca, not specified as Sjogren's, bilateral: Secondary | ICD-10-CM | POA: Diagnosis not present

## 2021-03-18 ENCOUNTER — Ambulatory Visit (INDEPENDENT_AMBULATORY_CARE_PROVIDER_SITE_OTHER): Payer: PPO

## 2021-03-18 VITALS — Ht 59.0 in | Wt 154.0 lb

## 2021-03-18 DIAGNOSIS — Z0000 Encounter for general adult medical examination without abnormal findings: Secondary | ICD-10-CM

## 2021-03-18 DIAGNOSIS — Z1231 Encounter for screening mammogram for malignant neoplasm of breast: Secondary | ICD-10-CM | POA: Diagnosis not present

## 2021-03-18 NOTE — Patient Instructions (Signed)
Deborah Pruitt , Thank you for taking time to come for your Medicare Wellness Visit. I appreciate your ongoing commitment to your health goals. Please review the following plan we discussed and let me know if I can assist you in the future.   Screening recommendations/referrals: Colonoscopy: Done 09/30/2014 - Repeat in 10 years Mammogram: Done 04/08/2019 - Repeat annually - ordered repeat today Bone Density: Done 02/25/2018 - Repeat every 2 years - get at next visit Recommended yearly ophthalmology/optometry visit for glaucoma screening and checkup Recommended yearly dental visit for hygiene and checkup  Vaccinations: Influenza vaccine: Done 2021 - repeat every fall Pneumococcal vaccine: Done 05/12/2016 & 06/25/2019 Tdap vaccine: Done 08/19/2013 - Repeat in 10 years Shingles vaccine: Due Shingrix discussed. Please contact your pharmacy for coverage information.    Covid-19: Done 12/17/19 & 12/17/19 - due for booster  Advanced directives: Please bring a copy of your health care power of attorney and living will to the office to be added to your chart at your convenience.  Conditions/risks identified: Aim for 30 minutes of exercise or brisk walking each day, drink 6-8 glasses of water and eat lots of fruits and vegetables.  Next appointment: Follow up in one year for your annual wellness visit    Preventive Care 65 Years and Older, Female Preventive care refers to lifestyle choices and visits with your health care provider that can promote health and wellness. What does preventive care include? A yearly physical exam. This is also called an annual well check. Dental exams once or twice a year. Routine eye exams. Ask your health care provider how often you should have your eyes checked. Personal lifestyle choices, including: Daily care of your teeth and gums. Regular physical activity. Eating a healthy diet. Avoiding tobacco and drug use. Limiting alcohol use. Practicing safe sex. Taking  low-dose aspirin every day. Taking vitamin and mineral supplements as recommended by your health care provider. What happens during an annual well check? The services and screenings done by your health care provider during your annual well check will depend on your age, overall health, lifestyle risk factors, and family history of disease. Counseling  Your health care provider may ask you questions about your: Alcohol use. Tobacco use. Drug use. Emotional well-being. Home and relationship well-being. Sexual activity. Eating habits. History of falls. Memory and ability to understand (cognition). Work and work Astronomer. Reproductive health. Screening  You may have the following tests or measurements: Height, weight, and BMI. Blood pressure. Lipid and cholesterol levels. These may be checked every 5 years, or more frequently if you are over 61 years old. Skin check. Lung cancer screening. You may have this screening every year starting at age 34 if you have a 30-pack-year history of smoking and currently smoke or have quit within the past 15 years. Fecal occult blood test (FOBT) of the stool. You may have this test every year starting at age 5. Flexible sigmoidoscopy or colonoscopy. You may have a sigmoidoscopy every 5 years or a colonoscopy every 10 years starting at age 77. Hepatitis C blood test. Hepatitis B blood test. Sexually transmitted disease (STD) testing. Diabetes screening. This is done by checking your blood sugar (glucose) after you have not eaten for a while (fasting). You may have this done every 1-3 years. Bone density scan. This is done to screen for osteoporosis. You may have this done starting at age 10. Mammogram. This may be done every 1-2 years. Talk to your health care provider about how often you  should have regular mammograms. Talk with your health care provider about your test results, treatment options, and if necessary, the need for more tests. Vaccines   Your health care provider may recommend certain vaccines, such as: Influenza vaccine. This is recommended every year. Tetanus, diphtheria, and acellular pertussis (Tdap, Td) vaccine. You may need a Td booster every 10 years. Zoster vaccine. You may need this after age 56. Pneumococcal 13-valent conjugate (PCV13) vaccine. One dose is recommended after age 57. Pneumococcal polysaccharide (PPSV23) vaccine. One dose is recommended after age 26. Talk to your health care provider about which screenings and vaccines you need and how often you need them. This information is not intended to replace advice given to you by your health care provider. Make sure you discuss any questions you have with your health care provider. Document Released: 10/08/2015 Document Revised: 05/31/2016 Document Reviewed: 07/13/2015 Elsevier Interactive Patient Education  2017 [employer] Prevention in the Home Falls can cause injuries. They can happen to people of all ages. There are many things you can do to make your home safe and to help prevent falls. What can I do on the outside of my home? Regularly fix the edges of walkways and driveways and fix any cracks. Remove anything that might make you trip as you walk through a door, such as a raised step or threshold. Trim any bushes or trees on the path to your home. Use bright outdoor lighting. Clear any walking paths of anything that might make someone trip, such as rocks or tools. Regularly check to see if handrails are loose or broken. Make sure that both sides of any steps have handrails. Any raised decks and porches should have guardrails on the edges. Have any leaves, snow, or ice cleared regularly. Use sand or salt on walking paths during winter. Clean up any spills in your garage right away. This includes oil or grease spills. What can I do in the bathroom? Use night lights. Install grab bars by the toilet and in the tub and shower. Do not use towel  bars as grab bars. Use non-skid mats or decals in the tub or shower. If you need to sit down in the shower, use a plastic, non-slip stool. Keep the floor dry. Clean up any water that spills on the floor as soon as it happens. Remove soap buildup in the tub or shower regularly. Attach bath mats securely with double-sided non-slip rug tape. Do not have throw rugs and other things on the floor that can make you trip. What can I do in the bedroom? Use night lights. Make sure that you have a light by your bed that is easy to reach. Do not use any sheets or blankets that are too big for your bed. They should not hang down onto the floor. Have a firm chair that has side arms. You can use this for support while you get dressed. Do not have throw rugs and other things on the floor that can make you trip. What can I do in the kitchen? Clean up any spills right away. Avoid walking on wet floors. Keep items that you use a lot in easy-to-reach places. If you need to reach something above you, use a strong step stool that has a grab bar. Keep electrical cords out of the way. Do not use floor polish or wax that makes floors slippery. If you must use wax, use non-skid floor wax. Do not have throw rugs and other things on the  floor that can make you trip. What can I do with my stairs? Do not leave any items on the stairs. Make sure that there are handrails on both sides of the stairs and use them. Fix handrails that are broken or loose. Make sure that handrails are as long as the stairways. Check any carpeting to make sure that it is firmly attached to the stairs. Fix any carpet that is loose or worn. Avoid having throw rugs at the top or bottom of the stairs. If you do have throw rugs, attach them to the floor with carpet tape. Make sure that you have a light switch at the top of the stairs and the bottom of the stairs. If you do not have them, ask someone to add them for you. What else can I do to help  prevent falls? Wear shoes that: Do not have high heels. Have rubber bottoms. Are comfortable and fit you well. Are closed at the toe. Do not wear sandals. If you use a stepladder: Make sure that it is fully opened. Do not climb a closed stepladder. Make sure that both sides of the stepladder are locked into place. Ask someone to hold it for you, if possible. Clearly mark and make sure that you can see: Any grab bars or handrails. First and last steps. Where the edge of each step is. Use tools that help you move around (mobility aids) if they are needed. These include: Canes. Walkers. Scooters. Crutches. Turn on the lights when you go into a dark area. Replace any light bulbs as soon as they burn out. Set up your furniture so you have a clear path. Avoid moving your furniture around. If any of your floors are uneven, fix them. If there are any pets around you, be aware of where they are. Review your medicines with your doctor. Some medicines can make you feel dizzy. This can increase your chance of falling. Ask your doctor what other things that you can do to help prevent falls. This information is not intended to replace advice given to you by your health care provider. Make sure you discuss any questions you have with your health care provider. Document Released: 07/08/2009 Document Revised: 02/17/2016 Document Reviewed: 10/16/2014 Elsevier Interactive Patient Education  2017 [employer].

## 2021-03-18 NOTE — Progress Notes (Signed)
Subjective:   Deborah Pruitt is a 80 y.o. female who presents for Medicare Annual (Subsequent) preventive examination.  Virtual Visit via Telephone Note  I connected with  Deborah Pruitt on 03/18/21 at  9:00 AM EDT by telephone and verified that I am speaking with the correct person using two identifiers.  Location: Patient: Home Provider: [facility] Persons participating in the virtual visit: patient/Nurse Health Advisor   I discussed the limitations, risks, security and privacy concerns of performing an evaluation and management service by telephone and the availability of in person appointments. The patient expressed understanding and agreed to proceed.  Interactive audio and video telecommunications were attempted between this nurse and patient, however failed, due to patient having technical difficulties OR patient did not have access to video capability.  We continued and completed visit with audio only.  Some vital signs may be absent or patient reported.   Shelbe Haglund E Brittley Regner, LPN   Review of Systems     Cardiac Risk Factors include: advanced age (>52men, >21 women);dyslipidemia;hypertension;obesity (BMI >30kg/m2)     Objective:    Today's Vitals   03/18/21 0858  Weight: 154 lb (69.9 kg)  Height:  (1.499 m)   Body mass index is 31.1 kg/m.  Advanced Directives 03/18/2021 03/17/2020 03/07/2019 02/05/2018 11/28/2017 11/28/2017 06/12/2015  Does Patient Have a Medical Advance Directive? Yes Yes Yes Yes Yes No No  Type of Estate agent of [place];Living will Healthcare Power of [place];Living will Healthcare Power of [place];Living will Healthcare Power of [place];Living will Healthcare Power of [place];Living will - -  Does patient want to make changes to medical advance directive? - No - Patient declined No - Patient declined - No - Patient declined - -  Copy of Healthcare Power of Attorney in Chart? No - copy requested No - copy requested No -  copy requested No - copy requested No - copy requested - -  Would patient like information on creating a medical advance directive? - - - - - - No - patient declined information    Current Medications (verified) Outpatient Encounter Medications as of 03/18/2021  Medication Sig   acidophilus (RISAQUAD) CAPS capsule Take 2 capsules by mouth daily.   amLODipine (NORVASC) 10 MG tablet Take 1 tablet (10 mg total) by mouth daily.   atorvastatin (LIPITOR) 40 MG tablet Take 1 tablet (40 mg total) by mouth daily.   Calcium-Phosphorus-Vitamin D (CALCIUM GUMMIES PO) Take by mouth.   Cholecalciferol (VITAMIN D3) 2000 UNITS TABS Take 1 tablet by mouth daily.   Cyanocobalamin (VITAMIN B 12 PO) Take 1 tablet by mouth daily.   Multiple Vitamin (MULTIVITAMIN WITH MINERALS) TABS tablet Take 2 tablets by mouth daily.   pantoprazole (PROTONIX) 40 MG tablet Take 1 tablet (40 mg total) by mouth daily.   RESTASIS 0.05 % ophthalmic emulsion    risedronate (ACTONEL) 150 MG tablet Take 1 tablet (150 mg total) by mouth every 30 (thirty) days. with water on empty stomach, nothing by mouth or lie down for next 30 minutes. Inform [place] not covered.   No facility-administered encounter medications on file as of 03/18/2021.    Allergies (verified) Milk-related compounds, Ciprofloxacin, Dexilant [dexlansoprazole], Penicillins, and Vioxx [rofecoxib]   History: Past Medical History:  Diagnosis Date   Allergy baby   Arthritis    Asthma    Cataract 2015   Complication of anesthesia    Ileus post hip replacement   GERD (gastroesophageal reflux disease)    Hyperlipidemia  Hypertension    Urinary tract infection 2012   Past Surgical History:  Procedure Laterality Date   CATARACT EXTRACTION W/PHACO Right 07/23/2014   Procedure: CATARACT EXTRACTION PHACO AND INTRAOCULAR LENS PLACEMENT RIGHT EYE CDE=6.24;  Surgeon: Gemma PayorKerry Hunt, MD;  Location: [facility];  Service: Ophthalmology;  Laterality: Right;   CATARACT EXTRACTION  W/PHACO Left 08/17/2014   Procedure: CATARACT EXTRACTION PHACO AND INTRAOCULAR LENS PLACEMENT LEFT EYE;  Surgeon: Gemma PayorKerry Hunt, MD;  Location: [facility];  Service: Ophthalmology;  Laterality: Left;  CDE:4.65   CHOLECYSTECTOMY     EYE SURGERY  2015, 2019   cataract, eyelids to help vision   JOINT REPLACEMENT Left    hip   TOTAL HIP ARTHROPLASTY Left 1999   Family History  Problem Relation Age of Onset   Stroke Mother 4779   Hypertension Mother    Heart disease Mother    Arthritis Mother    Asthma Mother    Heart disease Father    Diabetes Father    Cancer Father    Hearing loss Father    Liver cancer Brother    Cancer Brother    Heart disease Brother    Lung cancer Brother    Stomach cancer Maternal Grandmother    Cancer Maternal Grandmother    Colon cancer Neg Hx    Colon polyps Neg Hx    Esophageal cancer Neg Hx    Social History   Socioeconomic History   Marital status: Married    Spouse name: Doug   Number of children: 2   Years of education: 14   Highest education level: Some college, no degree  Occupational History   Occupation: Retired    Comment: Airline pilotaccountant  Tobacco Use   Smoking status: Never   Smokeless tobacco: Never  Vaping Use   Vaping Use: Never used  Substance and Sexual Activity   Alcohol use: No   Drug use: No   Sexual activity: Yes    Birth control/protection: Post-menopausal  Other Topics Concern   Not on file  Social History Narrative   The patient is recently retired, 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 She is adapting to retirement.      She is married with one son and one daughter. No alcohol caffeine or tobacco.      Children [place], son opposite side of [place]   Social Determinants of Health   Financial Resource Strain: Low Risk    Difficulty of Paying Living Expenses: Not hard at all  Food Insecurity: No Food Insecurity   Worried About Programme researcher, broadcasting/film/videounning Out of Food in the Last  Year: Never true   Baristaan Out of Food in the Last Year: Never true  Transportation Needs: No Transportation Needs   Lack of Transportation (Medical): No   Lack of Transportation (Non-Medical): No  Physical Activity: Sufficiently Active   Days of Exercise per Week: 7 days   Minutes of Exercise per Session: 30 min  Stress: No Stress Concern Present   Feeling of Stress : Only a little  Social Connections: Press photographerocially Integrated   Frequency of Communication with Friends and Family: More than three times a week   Frequency of Social Gatherings with Friends and Family: Once a week   Attends Religious Services: More than 4 times per year   Active Member of [employer]ubs or Organizations: Yes   Attends Engineer, structuralClub or Organization Meetings: More than 4 times per year   Marital Status: Married  Tobacco Counseling Counseling given: Not Answered   Clinical Intake:  Pre-visit preparation completed: Yes  Pain : No/denies pain     BMI - recorded: 32.11 Nutritional Status: BMI > 30  Obese Nutritional Risks: Nausea/ vomitting/ diarrhea (nausea from reflux) Diabetes: No  How often do you need to have someone help you when you read instructions, pamphlets, or other written materials from your doctor or pharmacy?: 1 - Never  Diabetic? No  Interpreter Needed?: No  Information entered by :: Amaura Authier, LPN   Activities of Daily Living In your present state of health, do you have any difficulty performing the following activities: 03/18/2021  Hearing? N  Vision? N  Difficulty concentrating or making decisions? N  Walking or climbing stairs? Y  Comment hip replacement and bone graft in 1999 - left her with limited ROM on one side  Dressing or bathing? N  Doing errands, shopping? N  Preparing Food and eating ? N  Using the Toilet? N  In the past six months, have you accidently leaked urine? N  Do you have problems with loss of bowel control? N  Managing your Medications? N  Managing your Finances? N   Housekeeping or managing your Housekeeping? N  Some recent data might be hidden    Patient Care Team: Raliegh Ip, DO as PCP - General (Family Medicine)  Indicate any recent Medical Services you may have received from other than [facility] providers in the past year (date may be approximate).     Assessment:   This is a routine wellness examination for Deborah Pruitt.  Hearing/Vision screen Hearing Screening - Comments:: Denies hearing difficulties  Vision Screening - Comments:: Wears eyeglasses - up to date with annual eye exams with [facility] in [place] and Dr Dione Booze for retinal tear  Dietary issues and exercise activities discussed: Current Exercise Habits: Home exercise routine, Type of exercise: walking, Time (Minutes): 30, Frequency (Times/Week): 7, Weekly Exercise (Minutes/Week): 210, Intensity: Mild, Exercise limited by: orthopedic condition(s)   Goals Addressed             This Visit's Progress    DIET - INCREASE WATER INTAKE   On track    Try to drink 6-8 glasses of water daily.      Exercise 150 min/wk Moderate Activity   On track    Walking is a great option.        Depression Screen PHQ 2/9 Scores 03/18/2021 05/24/2020 03/17/2020 10/15/2019 06/25/2019 03/07/2019 03/07/2019  PHQ - 2 Score 0 0 0 0 0 0 0  PHQ- 9 Score 3 - - 0 0 - -    Fall Risk Fall Risk  03/18/2021 05/24/2020 03/17/2020 10/15/2019 06/25/2019  Falls in the past year? 0 0 0 0 0  Number falls in past yr: 0 - - - -  Injury with Fall? 0 - - - -  Comment - - - - -  Risk Factor Category  - - - - -  Risk for fall due to : Orthopedic patient;Impaired vision - - - -  Follow up Falls prevention discussed - - - -    FALL RISK PREVENTION PERTAINING TO THE HOME:  Any stairs in or around the home? Yes  If so, are there any without handrails? No  Home free of loose throw rugs in walkways, pet beds, electrical cords, etc? Yes  Adequate lighting in your home to reduce risk of falls? Yes   ASSISTIVE DEVICES  UTILIZED TO PREVENT FALLS:  Life alert? No  Use of a cane, walker or w/c? No  Grab bars in the bathroom? Yes  Shower chair or bench in shower? Yes  Elevated toilet seat or a handicapped toilet? Yes   TIMED UP AND GO:  Was the test performed? No . Telephonic visit.  Cognitive Function: Normal cognitive status assessed by direct observation by this Nurse Health Advisor. No abnormalities found.   MMSE - Mini Mental State Exam 02/05/2018  Orientation to time 5  Orientation to Place 5  Registration 3  Attention/ Calculation 5  Recall 3  Language- name 2 objects 2  Language- repeat 1  Language- follow 3 step command 3  Language- read & follow direction 1  Write a sentence 1  Copy design 1  Total score 30     6CIT Screen 03/17/2020 03/07/2019  What Year? 0 points 0 points  What month? 0 points 0 points  What time? 0 points 0 points  Count back from 20 0 points 0 points  Months in reverse 0 points 0 points  Repeat phrase 0 points 0 points  Total Score 0 0    Immunizations Immunization History  Administered Date(s) Administered   Fluad Quad(high Dose 65+) 06/25/2019   Influenza, High Dose Seasonal PF 08/23/2017, 08/02/2018   Moderna Sars-Covid-2 Vaccination 11/19/2019, 12/17/2019   Pneumococcal Conjugate-13 05/12/2016   Pneumococcal Polysaccharide-23 06/25/2019   Tdap 08/19/2013    TDAP status: Up to date  Flu Vaccine status: Declined, Education has been provided regarding the importance of this vaccine but patient still declined. Advised may receive this vaccine at local pharmacy or Health Dept. Aware to provide a copy of the vaccination record if obtained from local pharmacy or Health Dept. Verbalized acceptance and understanding.  Pneumococcal vaccine status: Up to date  Covid-19 vaccine status: Completed vaccines  Qualifies for Shingles Vaccine? Yes   Zostavax completed No   Shingrix Completed?: No.    Education has been provided regarding the importance of this  vaccine. Patient has been advised to call insurance company to determine out of pocket expense if they have not yet received this vaccine. Advised may also receive vaccine at local pharmacy or Health Dept. Verbalized acceptance and understanding.  Screening Tests Health Maintenance  Topic Date Due   Hepatitis C Screening  Never done   Zoster Vaccines- Shingrix (1 of 2) Never done   MAMMOGRAM  04/07/2020   COVID-19 Vaccine (3 - Booster for Moderna series) 05/18/2020   INFLUENZA VACCINE  04/25/2021   TETANUS/TDAP  08/20/2023   DEXA SCAN  Completed   PNA vac Low Risk Adult  Completed   HPV VACCINES  Aged Out    Health Maintenance  Health Maintenance Due  Topic Date Due   Hepatitis C Screening  Never done   Zoster Vaccines- Shingrix (1 of 2) Never done   MAMMOGRAM  04/07/2020   COVID-19 Vaccine (3 - Booster for Moderna series) 05/18/2020    Colorectal cancer screening: Type of screening: Colonoscopy. Completed 09/30/2014. Repeat every 10 years  Mammogram status: Ordered 03/18/21. Pt provided with contact info and advised to call to schedule appt.   Bone Density status: Ordered 04/2020. Pt provided with contact info and advised to call to schedule appt. She will do at [facility] in August 2022  Lung Cancer Screening: (Low Dose CT Chest recommended if Age 45-80 years, 30 pack-year currently smoking OR have quit w/in 15years.) does not qualify.   Additional Screening:  Hepatitis C Screening: does not qualify  Vision Screening: Recommended annual ophthalmology  exams for early detection of glaucoma and other disorders of the eye. Is the patient up to date with their annual eye exam?  Yes  Who is the provider or what is the name of the office in which the patient attends annual eye exams? [facility] [place] and [place] If pt is not established with a provider, would they like to be referred to a provider to establish care? No .   Dental Screening: Recommended annual dental exams for proper oral  hygiene  Community Resource Referral / Chronic Care Management: CRR required this visit?  No   CCM required this visit?  No      Plan:     I have personally reviewed and noted the following in the patient's chart:   Medical and social history Use of alcohol, tobacco or illicit drugs  Current medications and supplements including opioid prescriptions.  Functional ability and status Nutritional status Physical activity Advanced directives List of other physicians Hospitalizations, surgeries, and ER visits in previous 12 months Vitals Screenings to include cognitive, depression, and falls Referrals and appointments  In addition, I have reviewed and discussed with patient certain preventive protocols, quality metrics, and best practice recommendations. A written personalized care plan for preventive services as well as general preventive health recommendations were provided to patient.     [place] Constable, LPN   01/11/6221   Nurse Notes: None

## 2021-05-11 ENCOUNTER — Other Ambulatory Visit: Payer: Self-pay

## 2021-05-11 ENCOUNTER — Other Ambulatory Visit: Payer: PPO

## 2021-05-11 DIAGNOSIS — E876 Hypokalemia: Secondary | ICD-10-CM

## 2021-05-11 DIAGNOSIS — E785 Hyperlipidemia, unspecified: Secondary | ICD-10-CM

## 2021-05-11 DIAGNOSIS — R7989 Other specified abnormal findings of blood chemistry: Secondary | ICD-10-CM

## 2021-05-11 DIAGNOSIS — I10 Essential (primary) hypertension: Secondary | ICD-10-CM

## 2021-05-12 LAB — CBC WITH DIFFERENTIAL/PLATELET
Basophils Absolute: 0 10*3/uL (ref 0.0–0.2)
Basos: 1 %
EOS (ABSOLUTE): 0.2 10*3/uL (ref 0.0–0.4)
Eos: 5 %
Hematocrit: 38.5 % (ref 34.0–46.6)
Hemoglobin: 13.5 g/dL (ref 11.1–15.9)
Immature Grans (Abs): 0 10*3/uL (ref 0.0–0.1)
Immature Granulocytes: 0 %
Lymphocytes Absolute: 1.2 10*3/uL (ref 0.7–3.1)
Lymphs: 30 %
MCH: 32.6 pg (ref 26.6–33.0)
MCHC: 35.1 g/dL (ref 31.5–35.7)
MCV: 93 fL (ref 79–97)
Monocytes Absolute: 0.4 10*3/uL (ref 0.1–0.9)
Monocytes: 10 %
Neutrophils Absolute: 2.3 10*3/uL (ref 1.4–7.0)
Neutrophils: 54 %
Platelets: 214 10*3/uL (ref 150–450)
RBC: 4.14 x10E6/uL (ref 3.77–5.28)
RDW: 13.5 % (ref 11.7–15.4)
WBC: 4.1 10*3/uL (ref 3.4–10.8)

## 2021-05-12 LAB — CMP14+EGFR
ALT: 13 IU/L (ref 0–32)
AST: 20 IU/L (ref 0–40)
Albumin/Globulin Ratio: 2.5 — ABNORMAL HIGH (ref 1.2–2.2)
Albumin: 4.7 g/dL (ref 3.7–4.7)
Alkaline Phosphatase: 73 IU/L (ref 44–121)
BUN/Creatinine Ratio: 15 (ref 12–28)
BUN: 14 mg/dL (ref 8–27)
Bilirubin Total: 0.4 mg/dL (ref 0.0–1.2)
CO2: 23 mmol/L (ref 20–29)
Calcium: 9.7 mg/dL (ref 8.7–10.3)
Chloride: 101 mmol/L (ref 96–106)
Creatinine, Ser: 0.94 mg/dL (ref 0.57–1.00)
Globulin, Total: 1.9 g/dL (ref 1.5–4.5)
Glucose: 107 mg/dL — ABNORMAL HIGH (ref 65–99)
Potassium: 3.8 mmol/L (ref 3.5–5.2)
Sodium: 142 mmol/L (ref 134–144)
Total Protein: 6.6 g/dL (ref 6.0–8.5)
eGFR: 62 mL/min/{1.73_m2} (ref >59)

## 2021-05-12 LAB — LIPID PANEL
Chol/HDL Ratio: 4.5 ratio — ABNORMAL HIGH (ref 0.0–4.4)
Cholesterol, Total: 126 mg/dL (ref 100–199)
HDL: 28 mg/dL — ABNORMAL LOW (ref >39)
LDL Chol Calc (NIH): 58 mg/dL (ref 0–99)
Triglycerides: 252 mg/dL — ABNORMAL HIGH (ref 0–149)
VLDL Cholesterol Cal: 40 mg/dL (ref 5–40)

## 2021-05-12 LAB — VITAMIN D 25 HYDROXY (VIT D DEFICIENCY, FRACTURES): Vit D, 25-Hydroxy: 57.8 ng/mL (ref 30.0–100.0)

## 2021-05-12 LAB — TSH: TSH: 0.926 u[IU]/mL (ref 0.450–4.500)

## 2021-05-13 ENCOUNTER — Encounter: Payer: Self-pay | Admitting: Family Medicine

## 2021-05-13 ENCOUNTER — Ambulatory Visit (INDEPENDENT_AMBULATORY_CARE_PROVIDER_SITE_OTHER): Payer: PPO | Admitting: Family Medicine

## 2021-05-13 ENCOUNTER — Other Ambulatory Visit: Payer: Self-pay

## 2021-05-13 ENCOUNTER — Ambulatory Visit (INDEPENDENT_AMBULATORY_CARE_PROVIDER_SITE_OTHER): Payer: PPO

## 2021-05-13 VITALS — BP 139/79 | HR 68 | Temp 97.9°F | Ht 59.0 in | Wt 155.8 lb

## 2021-05-13 DIAGNOSIS — K2100 Gastro-esophageal reflux disease with esophagitis, without bleeding: Secondary | ICD-10-CM | POA: Diagnosis not present

## 2021-05-13 DIAGNOSIS — Z780 Asymptomatic menopausal state: Secondary | ICD-10-CM

## 2021-05-13 DIAGNOSIS — E785 Hyperlipidemia, unspecified: Secondary | ICD-10-CM

## 2021-05-13 DIAGNOSIS — I10 Essential (primary) hypertension: Secondary | ICD-10-CM | POA: Diagnosis not present

## 2021-05-13 DIAGNOSIS — M810 Age-related osteoporosis without current pathological fracture: Secondary | ICD-10-CM | POA: Diagnosis not present

## 2021-05-13 DIAGNOSIS — Z0001 Encounter for general adult medical examination with abnormal findings: Secondary | ICD-10-CM | POA: Diagnosis not present

## 2021-05-13 DIAGNOSIS — Z0000 Encounter for general adult medical examination without abnormal findings: Secondary | ICD-10-CM

## 2021-05-13 MED ORDER — ATORVASTATIN CALCIUM 40 MG PO TABS: 40.0000 mg | ORAL_TABLET | Freq: Every day | ORAL | 3 refills | Status: DC

## 2021-05-13 MED ORDER — AMLODIPINE BESYLATE 10 MG PO TABS: 10.0000 mg | ORAL_TABLET | Freq: Every day | ORAL | 3 refills | Status: DC

## 2021-05-13 MED ORDER — PANTOPRAZOLE SODIUM 40 MG PO TBEC: 40.0000 mg | DELAYED_RELEASE_TABLET | Freq: Every day | ORAL | 3 refills | Status: DC

## 2021-05-13 NOTE — Patient Instructions (Signed)
High Triglycerides Eating Plan Triglycerides are a type of fat in the blood. High levels of triglycerides can increase your risk of heart disease and stroke. If your triglyceride levels are high, choosing the right foods can help lower your triglycerides and keep your heart healthy. Work with your health care provider or a diet and nutrition specialist (dietitian) to develop an eating plan that is right for you. What are tips for following this plan? General guidelines  Lose weight, if you are overweight. For most people, losing 5-10 lbs (2-5 kg) helps lower triglyceride levels. A weight-loss plan may include. 30 minutes of exercise at least 5 days a week. Reducing the amount of calories, sugar, and fat you eat. Eat a wide variety of fresh fruits, vegetables, and whole grains. These foods are high in fiber. Eat foods that contain healthy fats, such as fatty fish, nuts, seeds, and olive oil. Avoid foods that are high in added sugar, added salt (sodium), saturated fat, and trans fat. Avoid low-fiber, refined carbohydrates such as white bread, crackers, noodles, and white rice. Avoid foods with partially hydrogenated oils (trans fats), such as fried foods or stick margarine. Limit alcohol intake to no more than 1 drink a day for nonpregnant women and 2 drinks a day for men. One drink equals 12 oz of beer, 5 oz of wine, or 1 oz of hard liquor. Your health care provider may recommend that you drink less depending on your overall health.  Reading food labels Check food labels for the amount of saturated fat. Choose foods with no or very little saturated fat. Check food labels for the amount of trans fat. Choose foods with no trans fat. Check food labels for the amount of cholesterol. Choose foods low in cholesterol. Ask your dietitian how much cholesterol you should have each day. Check food labels for the amount of sodium. Choose foods with less than 140 milligrams (mg) per serving. Shopping Buy  dairy products labeled as nonfat (skim) or low-fat (1%). Avoid buying processed or prepackaged foods. These are often high in added sugar, sodium, and fat. Cooking Choose healthy fats when cooking, such as olive oil or canola oil. Cook foods using lower fat methods, such as baking, broiling, boiling, or grilling. Make your own sauces, dressings, and marinades when possible, instead of buying them. Store-bought sauces, dressings, and marinades are often high in sodium and sugar. Meal planning Eat more home-cooked food and less restaurant, buffet, and fast food. Eat fatty fish at least 2 times each week. Examples of fatty fish include salmon, trout, mackerel, tuna, and herring. If you eat whole eggs, do not eat more than 3 egg yolks per week. What foods are recommended? The items listed may not be a complete list. Talk with your dietitian aboutwhat dietary choices are best for you. Grains Whole wheat or whole grain breads, crackers, cereals, and pasta. Unsweetenedoatmeal. Bulgur. Barley. Quinoa. Brown rice. Whole wheat flour tortillas. Vegetables Fresh or frozen vegetables. Low-sodium canned vegetables. Fruits All fresh, canned (in natural juice), or frozen fruits. Meats and other protein foods Skinless chicken or turkey. Ground chicken or turkey. Lean cuts of pork, trimmed of fat. Fish and seafood, especially salmon, trout, and herring. Egg whites. Dried beans, peas, or lentils. Unsalted nuts or seeds. Unsalted cannedbeans. Natural peanut or almond butter. Dairy Low-fat dairy products. Skim or low-fat (1%) milk. Reduced fat (2%) and low-sodium cheese. Low-fat ricotta cheese. Low-fat cottage cheese. Plain,low-fat yogurt. Fats and oils Tub margarine without trans fats. Light or reduced-fat   mayonnaise. Light or reduced-fat salad dressings.Avocado. Safflower, olive, sunflower, soybean, and canola oils. What foods are not recommended? The items listed may not be a complete list. Talk with your  dietitian aboutwhat dietary choices are best for you. Grains White bread. White (regular) pasta. White rice. Cornbread. Bagels. Pastries. Crackers that contain trans fat. Vegetables Creamed or fried vegetables. Vegetables in a cheese sauce. Fruits Sweetened dried fruit. Canned fruit in syrup. Fruit juice. Meats and other protein foods Fatty cuts of meat. Ribs. Chicken wings. Bacon. Sausage. Bologna. Salami.Chitterlings. Fatback. Hot dogs. Bratwurst. Packaged lunch meats. Dairy Whole or reduced-fat (2%) milk. Half-and-half. Cream cheese. Full-fat or sweetened yogurt. Full-fat cheese. Nondairy creamers. Whipped toppings.Processed cheese or cheese spreads. Cheese curds. Beverages Alcohol. Sweetened drinks, such as soda, lemonade, fruit drinks, or punches. Fats and oils Butter. Stick margarine. Lard. Shortening. Ghee. Bacon fat. Tropical oils, suchas coconut, palm kernel, or palm oils. Sweets and desserts Corn syrup. Sugars. Honey. Molasses. Candy. Jam and jelly. Syrup. Sweetenedcereals. Cookies. Pies. Cakes. Donuts. Muffins. Ice cream. Condiments Store-bought sauces, dressings, and marinades that are high in sugar, such asketchup and barbecue sauce. Summary High levels of triglycerides can increase the risk of heart disease and stroke. Choosing the right foods can help lower your triglycerides. Eat plenty of fresh fruits, vegetables, and whole grains. Choose low-fat dairy and lean meats. Eat fatty fish at least twice a week. Avoid processed and prepackaged foods with added sugar, sodium, saturated fat, and trans fat. If you need suggestions or have questions about what types of food are good for you, talk with your health care provider or a dietitian. This information is not intended to replace advice given to you by your health care provider. Make sure you discuss any questions you have with your healthcare provider. Document Revised: 01/12/2020 Document Reviewed: 01/14/2020 Elsevier Patient  Education  2022 [employer]  

## 2021-05-13 NOTE — Progress Notes (Signed)
Deborah Pruitt is a 80 y.o. female presents to office today for annual physical exam examination.    Concerns today include: 1.  Hypertension with hyperlipidemia Patient is compliant with Norvasc, Lipitor.  No chest pain, shortness of breath or change in exercise tolerance.  She volunteers a lot and stays physically active.  She feels that she is in better health and most patients her age  45.  Osteoporosis Patient mitts that she never started her bisphosphonate because she was worried that she may have an allergic reaction to the bisphosphonate.  Additionally, the Actonel was very expensive.  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  She walks at least once daily but up to twice daily.  Occupation: retired but volunteers  Diet: no dairy, Exercise: daily Last eye exam: UTD Last dental exam: UTD Last colonoscopy: UTD Last mammogram: UTD Last pap smear: n/a Refills needed today: all Immunizations needed: Immunization History  Administered Date(s) Administered   Fluad Quad(high Dose 65+) 06/25/2019   Influenza, High Dose Seasonal PF 08/23/2017, 08/02/2018   Moderna Sars-Covid-2 Vaccination 11/19/2019, 12/17/2019   Pneumococcal Conjugate-13 05/12/2016   Pneumococcal Polysaccharide-23 06/25/2019   Tdap 08/19/2013     Past Medical History:  Diagnosis Date   Allergy baby   Arthritis    Asthma    Cataract 2015   Complication of anesthesia    Ileus post hip replacement   GERD (gastroesophageal reflux disease)    Hyperlipidemia    Hypertension    Urinary tract infection 2012   Social History   Socioeconomic History   Marital status: Married    Spouse name: Doug   Number of children: 2   Years of education: 14   Highest education level: Some college, no degree  Occupational History   Occupation: Retired    Comment: Airline pilot  Tobacco Use   Smoking status: Never   Smokeless tobacco:  Never  Vaping Use   Vaping Use: Never used  Substance and Sexual Activity   Alcohol use: No   Drug use: No   Sexual activity: Yes    Birth control/protection: Post-menopausal  Other Topics Concern   Not on file  Social History Narrative   The patient is recently retired, 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      She is married with one son and one daughter. No alcohol caffeine or tobacco.      Children live hours away - daughter near the [facility], son opposite side of [place]   Social Determinants of Health   Financial Resource Strain: Low Risk    Difficulty of Paying Living Expenses: Not hard at all  Food Insecurity: No Food Insecurity   Worried About Programme researcher, broadcasting/film/video in the Last Year: Never true   Barista in the Last Year: Never true  Transportation Needs: No Transportation Needs   Lack of Transportation (Medical): No   Lack of Transportation (Non-Medical): No  Physical Activity: Sufficiently Active   Days of Exercise per Week: 7 days   Minutes of Exercise per Session: 30 min  Stress: No Stress Concern Present   Feeling of Stress : Only a little  Social Connections: Press photographer of Communication with Friends and Family: More than three times a week   Frequency of Social Gatherings with Friends and Family: Once a week  Attends Religious Services: More than 4 times per year   Active Member of Clubs or Organizations: Yes   Attends Banker Meetings: More than 4 times per year   Marital Status: Married  Catering manager Violence: Not At Risk   Fear of Current or Ex-Partner: No   Emotionally Abused: No   Physically Abused: No   Sexually Abused: No   Past Surgical History:  Procedure Laterality Date   CATARACT EXTRACTION W/PHACO Right 07/23/2014   Procedure: CATARACT EXTRACTION PHACO AND INTRAOCULAR LENS PLACEMENT RIGHT EYE CDE=6.24;  Surgeon: Gemma Payor, MD;  Location: [facility];  Service: Ophthalmology;  Laterality: Right;   CATARACT EXTRACTION W/PHACO Left 08/17/2014   Procedure: CATARACT EXTRACTION PHACO AND INTRAOCULAR LENS PLACEMENT LEFT EYE;  Surgeon: Gemma Payor, MD;  Location: [facility];  Service: Ophthalmology;  Laterality: Left;  CDE:4.65   CHOLECYSTECTOMY     EYE SURGERY  2015, 2019   cataract, eyelids to help vision   JOINT REPLACEMENT Left    hip   TOTAL HIP ARTHROPLASTY Left 1999   Family History  Problem Relation Age of Onset   Stroke Mother 65   Hypertension Mother    Heart disease Mother    Arthritis Mother    Asthma Mother    Heart disease Father    Diabetes Father    Cancer Father    Hearing loss Father    Liver cancer Brother    Cancer Brother    Heart disease Brother    Lung cancer Brother    Stomach cancer Maternal Grandmother    Cancer Maternal Grandmother    Colon cancer Neg Hx    Colon polyps Neg Hx    Esophageal cancer Neg Hx     Current Outpatient Medications:    amLODipine (NORVASC) 10 MG tablet, Take 1 tablet (10 mg total) by mouth daily., Disp: 90 tablet, Rfl: 3   atorvastatin (LIPITOR) 40 MG tablet, Take 1 tablet (40 mg total) by mouth daily., Disp: 90 tablet, Rfl: 3   Calcium-Phosphorus-Vitamin D (CALCIUM GUMMIES PO), Take by mouth., Disp: , Rfl:    Cholecalciferol (VITAMIN D3) 2000 UNITS TABS, Take 1 tablet by mouth daily., Disp: , Rfl:    Cyanocobalamin (VITAMIN B 12 PO), Take 1 tablet by mouth daily., Disp: , Rfl:    Multiple Vitamin (MULTIVITAMIN WITH MINERALS) TABS tablet, Take 2 tablets by mouth daily., Disp: , Rfl:    pantoprazole (PROTONIX) 40 MG tablet, Take 1 tablet (40 mg total) by mouth daily., Disp: 90 tablet, Rfl: 3   RESTASIS 0.05 % ophthalmic emulsion, , Disp: , Rfl:   Allergies  Allergen Reactions   Milk-Related Compounds Other (See Comments)    "I almost died"   Ciprofloxacin Hives and Swelling   Dexilant [Dexlansoprazole] Hives and Swelling   Penicillins Other (See Comments)    Childhood  allergy.   Vioxx [Rofecoxib] Hives and Swelling     ROS: Review of Systems Pertinent items noted in HPI and remainder of comprehensive ROS otherwise negative.    Physical exam BP 139/79   Pulse 68   Temp 97.9 F (36.6 C)   Ht 4\' 11"  (1.499 m)   Wt 155 lb 12.8 oz (70.7 kg)   SpO2 95%   BMI 31.47 kg/m  General appearance: alert, cooperative, appears stated age, and no distress Head: Normocephalic, without obvious abnormality, atraumatic Eyes: negative findings: lids and lashes normal, conjunctivae and sclerae normal, corneas clear, and pupils equal, round, reactive to light and accomodation Ears:  normal TM's and external ear canals both ears Nose: Nares normal. Septum midline. Mucosa normal. No drainage or sinus tenderness. Throat: lips, mucosa, and tongue normal; teeth and gums normal Neck: no adenopathy, no carotid bruit, supple, symmetrical, trachea midline, and thyroid not enlarged, symmetric, no tenderness/mass/nodules Back: symmetric, no curvature. ROM normal. No CVA tenderness. Lungs: clear to auscultation bilaterally Heart: regular rate and rhythm, S1, S2 normal, no murmur, click, rub or gallop Abdomen: soft, non-tender; bowel sounds normal; no masses,  no organomegaly Extremities: extremities normal, atraumatic, no cyanosis or edema Pulses: 2+ and symmetric Skin: Skin color, texture, turgor normal. No rashes or lesions Lymph nodes: Cervical, supraclavicular, and axillary nodes normal. Neurologic: Alert and oriented X 3, normal strength and tone. Normal symmetric reflexes. Normal coordination and gait Psych: Mood stable, speech normal.  Affect somewhat flat  Depression screen [facility] 2/9 05/13/2021 03/18/2021 05/24/2020  Decreased Interest 0 0 0  Down, Depressed, Hopeless 0 0 0  PHQ - 2 Score 0 0 0  Altered sleeping 0 2 -  Tired, decreased energy 0 0 -  Change in appetite 1 0 -  Feeling bad or failure about yourself  0 0 -  Trouble concentrating 0 1 -  Moving slowly or  fidgety/restless 0 0 -  Suicidal thoughts 0 0 -  PHQ-9 Score 1 3 -  Difficult doing work/chores Not difficult at all Not difficult at all -      Assessment/ Plan: Warren Lacy here for annual physical exam.   Annual physical exam  Hyperlipidemia with target LDL less than 100 - Plan: atorvastatin (LIPITOR) 40 MG tablet  Essential hypertension - Plan: amLODipine (NORVASC) 10 MG tablet  Age-related osteoporosis without current pathological fracture  Gastroesophageal reflux disease with esophagitis without hemorrhage - Plan: pantoprazole (PROTONIX) 40 MG tablet  Continue Lipitor.  This has been renewed  Blood pressure at goal for age.  Continue current regimen  Check DEXA scan.  We discussed alternatives to Actonel including Fosamax.  She is leaning toward something that is much shorter acting in case she has an allergic reaction.  Possibility of allergic reaction has been her barrier to starting anything for her bones.  Of note she has a history of left-sided hip replacement.  Reinforced resistance training and handout was provided today.  GERD is stable with PPI.'s been renewed  Counseled on healthy lifestyle choices, including diet (rich in fruits, vegetables and lean meats and low in salt and simple carbohydrates) and exercise (at least 30 minutes of moderate physical activity daily).  Patient to follow up in 1 year for annual exam or sooner if needed.  Maliya Marich M. Nadine Counts, DO

## 2021-05-16 ENCOUNTER — Other Ambulatory Visit: Payer: Self-pay

## 2021-05-16 DIAGNOSIS — M810 Age-related osteoporosis without current pathological fracture: Secondary | ICD-10-CM | POA: Diagnosis not present

## 2021-05-16 MED ORDER — ALENDRONATE SODIUM 70 MG PO TABS: 70.0000 mg | ORAL_TABLET | ORAL | 11 refills | Status: DC

## 2021-05-31 ENCOUNTER — Other Ambulatory Visit: Payer: Self-pay | Admitting: Family Medicine

## 2021-05-31 DIAGNOSIS — Z1231 Encounter for screening mammogram for malignant neoplasm of breast: Secondary | ICD-10-CM

## 2021-06-22 ENCOUNTER — Other Ambulatory Visit: Payer: Self-pay

## 2021-06-22 ENCOUNTER — Ambulatory Visit
Admission: RE | Admit: 2021-06-22 | Discharge: 2021-06-22 | Disposition: A | Discharge status: Home / Self Care | Payer: PPO | Source: Ambulatory Visit | Attending: Family Medicine | Admitting: Family Medicine

## 2021-06-22 DIAGNOSIS — Z1231 Encounter for screening mammogram for malignant neoplasm of breast: Secondary | ICD-10-CM | POA: Diagnosis not present

## 2021-08-01 DIAGNOSIS — H35341 Macular cyst, hole, or pseudohole, right eye: Secondary | ICD-10-CM | POA: Diagnosis not present

## 2021-08-01 DIAGNOSIS — H40013 Open angle with borderline findings, low risk, bilateral: Secondary | ICD-10-CM | POA: Diagnosis not present

## 2021-08-01 DIAGNOSIS — Z961 Presence of intraocular lens: Secondary | ICD-10-CM | POA: Diagnosis not present

## 2021-08-01 DIAGNOSIS — H16223 Keratoconjunctivitis sicca, not specified as Sjogren's, bilateral: Secondary | ICD-10-CM | POA: Diagnosis not present

## 2021-12-12 DIAGNOSIS — M25562 Pain in left knee: Secondary | ICD-10-CM | POA: Diagnosis not present

## 2022-02-21 DIAGNOSIS — M79672 Pain in left foot: Secondary | ICD-10-CM | POA: Diagnosis not present

## 2022-02-21 DIAGNOSIS — M7742 Metatarsalgia, left foot: Secondary | ICD-10-CM | POA: Diagnosis not present

## 2022-03-14 DIAGNOSIS — M7742 Metatarsalgia, left foot: Secondary | ICD-10-CM | POA: Diagnosis not present

## 2022-03-14 DIAGNOSIS — M79672 Pain in left foot: Secondary | ICD-10-CM | POA: Diagnosis not present

## 2022-03-20 ENCOUNTER — Ambulatory Visit (INDEPENDENT_AMBULATORY_CARE_PROVIDER_SITE_OTHER): Payer: PPO

## 2022-03-20 ENCOUNTER — Telehealth: Payer: Self-pay

## 2022-03-20 VITALS — Wt 155.0 lb

## 2022-03-20 DIAGNOSIS — Z0000 Encounter for general adult medical examination without abnormal findings: Secondary | ICD-10-CM | POA: Diagnosis not present

## 2022-03-20 NOTE — Telephone Encounter (Signed)
Appt made for 6/30 with Gott

## 2022-03-24 ENCOUNTER — Ambulatory Visit (INDEPENDENT_AMBULATORY_CARE_PROVIDER_SITE_OTHER): Payer: PPO | Admitting: Family Medicine

## 2022-03-24 ENCOUNTER — Ambulatory Visit (HOSPITAL_BASED_OUTPATIENT_CLINIC_OR_DEPARTMENT_OTHER)
Admission: RE | Admit: 2022-03-24 | Discharge: 2022-03-24 | Disposition: A | Discharge status: Home / Self Care | Payer: PPO | Source: Ambulatory Visit | Attending: Family Medicine | Admitting: Family Medicine

## 2022-03-24 ENCOUNTER — Encounter: Payer: Self-pay | Admitting: Family Medicine

## 2022-03-24 VITALS — BP 152/82 | HR 72 | Temp 98.0°F | Ht 59.0 in | Wt 159.8 lb

## 2022-03-24 DIAGNOSIS — M7989 Other specified soft tissue disorders: Secondary | ICD-10-CM | POA: Diagnosis not present

## 2022-03-24 DIAGNOSIS — R2242 Localized swelling, mass and lump, left lower limb: Secondary | ICD-10-CM | POA: Diagnosis not present

## 2022-03-24 NOTE — Progress Notes (Signed)
Subjective: [id number] PCP: Raliegh Ip, DO QVZ:DGLOVFI Deborah Pruitt is a 81 y.o. female presenting to clinic today for:  1. Edema Patient reports localized left lower extremity edema that sometimes will swell all the way up to her knee.  This been present for the last 2 to 3 weeks.  She denies any preceding injury.  She has a history of left-sided trauma in the past where she fractured her left lower extremity in 3 places but that was many years ago and repaired surgically.  She had a corticosteroid injection administered a few months ago but again the swelling did not occur into the last couple of weeks.  She reports some discomfort but no significant pain with ambulation.  She is ambulating independently.  The swelling gets slightly better each morning but really never goes away.  No known family history of DVT.  Patient's had no recent travel.   ROS: Per HPI  Allergies  Allergen Reactions   Milk-Related Compounds Other (See Comments)    "I almost died"   Ciprofloxacin Hives and Swelling   Dexilant [Dexlansoprazole] Hives and Swelling   Penicillins Other (See Comments)    Childhood allergy.   Vioxx [Rofecoxib] Hives and Swelling   Past Medical History:  Diagnosis Date   Allergy baby   Arthritis    Asthma    Cataract 2015   Complication of anesthesia    Ileus post hip replacement   GERD (gastroesophageal reflux disease)    Hyperlipidemia    Hypertension    Urinary tract infection 2012    Current Outpatient Medications:    amLODipine (NORVASC) 10 MG tablet, Take 1 tablet (10 mg total) by mouth daily., Disp: 90 tablet, Rfl: 3   atorvastatin (LIPITOR) 40 MG tablet, Take 1 tablet (40 mg total) by mouth daily., Disp: 90 tablet, Rfl: 3   Calcium-Phosphorus-Vitamin D (CALCIUM GUMMIES PO), Take by mouth., Disp: , Rfl:    Cholecalciferol (VITAMIN D3) 2000 UNITS TABS, Take 1 tablet by mouth daily., Disp: , Rfl:    Cyanocobalamin (VITAMIN B 12 PO), Take 1 tablet by mouth  daily., Disp: , Rfl:    Multiple Vitamin (MULTIVITAMIN WITH MINERALS) TABS tablet, Take 2 tablets by mouth daily., Disp: , Rfl:    Multiple Vitamins-Minerals ([place] PO), Take by mouth., Disp: , Rfl:    pantoprazole (PROTONIX) 40 MG tablet, Take 1 tablet (40 mg total) by mouth daily., Disp: 90 tablet, Rfl: 3   RESTASIS 0.05 % ophthalmic emulsion, , Disp: , Rfl:  Social History   Socioeconomic History   Marital status: Married    Spouse name: Doug   Number of children: 2   Years of education: 14   Highest education level: Some college, no degree  Occupational History   Occupation: Retired    Comment: Airline pilot  Tobacco Use   Smoking status: Never   Smokeless tobacco: Never  Vaping Use   Vaping Use: Never used  Substance and Sexual Activity   Alcohol use: No   Drug use: No   Sexual activity: Yes    Birth control/protection: Post-menopausal  Other Topics Concern   Not on file  Social History Narrative   The patient is recently retired, 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      She is married with one son and one daughter. No alcohol caffeine or tobacco.      Children live hours away - daughter near the [facility], son opposite side  of [place]      Very involved in church groups, travelling, volunteering, etc   Social Determinants of Health   Financial Resource Strain: Low Risk  (03/20/2022)   Overall Financial Resource Strain (CARDIA)    Difficulty of Paying Living Expenses: Not hard at all  Food Insecurity: No Food Insecurity (03/20/2022)   Hunger Vital Sign    Worried About Running Out of Food in the Last Year: Never true    Ran Out of Food in the Last Year: Never true  Transportation Needs: No Transportation Needs (03/20/2022)   PRAPARE - Administrator, Civil Service (Medical): No    Lack of Transportation (Non-Medical): No  Physical Activity: Sufficiently Active (03/20/2022)   Exercise Vital Sign     Days of Exercise per Week: 7 days    Minutes of Exercise per Session: 30 min  Stress: No Stress Concern Present (03/20/2022)   [employer] of Occupational Health - Occupational Stress Questionnaire    Feeling of Stress : Only a little  Social Connections: Socially Integrated (03/20/2022)   Social Connection and Isolation Panel [NHANES]    Frequency of Communication with Friends and Family: More than three times a week    Frequency of Social Gatherings with Friends and Family: Twice a week    Attends Religious Services: More than 4 times per year    Active Member of [employer] or Organizations: Yes    Attends Engineer, structural: More than 4 times per year    Marital Status: Married  Catering manager Violence: Not At Risk (03/20/2022)   Humiliation, Afraid, Rape, and Kick questionnaire    Fear of Current or Ex-Partner: No    Emotionally Abused: No    Physically Abused: No    Sexually Abused: No   Family History  Problem Relation Age of Onset   Stroke Mother 36   Hypertension Mother    Heart disease Mother    Arthritis Mother    Asthma Mother    Heart disease Father    Diabetes Father    Cancer Father    Hearing loss Father    Stomach cancer Maternal Grandmother    Cancer Maternal Grandmother    Liver cancer Brother    Cancer Brother    Heart disease Brother    Lung cancer Brother    Colon cancer Neg Hx    Colon polyps Neg Hx    Esophageal cancer Neg Hx    Breast cancer Neg Hx     Objective: Office vital signs reviewed. BP (!) 155/83   Pulse 72   Temp 98 F (36.7 Deborah)   Ht 4\' 11"  (1.499 m)   Wt 159 lb 12.8 oz (72.5 kg)   SpO2 96%   BMI 32.28 kg/m   Physical Examination:  General: Awake, alert, well nourished, No acute distress MSK:   Left calf: Girth is 34 cm.  She has erythema, warmth and induration with 1+ pitting edema to mid shin.  No palpable cords or pain to the calf.  Right calf: Girth is 29 cm.  No edema.  No erythema  Assessment/ Plan: 81  y.o. female   Localized swelling of left lower leg - Plan: 70 Venous Img Lower Unilateral Left  Stat unilateral ultrasound to rule out DVT.  She has a 5 cm difference between the lower extremities.  No apparent risk factors but given the upcoming holiday did not feel comfortable simply obtaining D-dimer as this would delay her care.  [place]  score for DVT 2.  No orders of the defined types were placed in this encounter.  No orders of the defined types were placed in this encounter.    Raliegh Ip, DO [facility] [phone]

## 2022-03-24 NOTE — Addendum Note (Signed)
Addended by: Raliegh Ip on: 03/24/2022 02:47 PM   Modules accepted: Orders

## 2022-04-24 ENCOUNTER — Ambulatory Visit: Payer: PPO | Admitting: Family Medicine

## 2022-05-21 ENCOUNTER — Other Ambulatory Visit: Payer: Self-pay | Admitting: Family Medicine

## 2022-05-21 DIAGNOSIS — E785 Hyperlipidemia, unspecified: Secondary | ICD-10-CM

## 2022-05-23 ENCOUNTER — Other Ambulatory Visit: Payer: Self-pay | Admitting: Family Medicine

## 2022-05-23 DIAGNOSIS — K2100 Gastro-esophageal reflux disease with esophagitis, without bleeding: Secondary | ICD-10-CM

## 2022-06-07 ENCOUNTER — Telehealth: Payer: Self-pay | Admitting: Family Medicine

## 2022-06-07 DIAGNOSIS — I10 Essential (primary) hypertension: Secondary | ICD-10-CM

## 2022-06-07 DIAGNOSIS — E785 Hyperlipidemia, unspecified: Secondary | ICD-10-CM

## 2022-06-07 DIAGNOSIS — K2100 Gastro-esophageal reflux disease with esophagitis, without bleeding: Secondary | ICD-10-CM

## 2022-06-07 MED ORDER — AMLODIPINE BESYLATE 10 MG PO TABS: 10.0000 mg | ORAL_TABLET | Freq: Every day | ORAL | 3 refills | Status: DC

## 2022-06-07 MED ORDER — PANTOPRAZOLE SODIUM 40 MG PO TBEC: 40.0000 mg | DELAYED_RELEASE_TABLET | Freq: Every day | ORAL | 0 refills | Status: DC

## 2022-06-07 MED ORDER — ATORVASTATIN CALCIUM 40 MG PO TABS: 40.0000 mg | ORAL_TABLET | Freq: Every day | ORAL | 0 refills | Status: DC

## 2022-06-07 NOTE — Telephone Encounter (Signed)
  Prescription Request  06/07/2022  What is the name of the medication or equipment? ALL  Have you contacted your pharmacy to request a refill? YES  Which pharmacy would you like this sent to?  [facility]  Pt scheduled for CPE on 08/16/22. Needs meds sent in to last her until her appt.

## 2022-06-07 NOTE — Telephone Encounter (Signed)
Patient aware.

## 2022-06-07 NOTE — Telephone Encounter (Signed)
Refills sent to pharmacy. 

## 2022-06-15 ENCOUNTER — Other Ambulatory Visit: Payer: Self-pay | Admitting: Family Medicine

## 2022-06-15 DIAGNOSIS — E785 Hyperlipidemia, unspecified: Secondary | ICD-10-CM

## 2022-06-15 DIAGNOSIS — D1722 Benign lipomatous neoplasm of skin and subcutaneous tissue of left arm: Secondary | ICD-10-CM | POA: Diagnosis not present

## 2022-06-30 ENCOUNTER — Ambulatory Visit: Payer: PPO | Admitting: Family Medicine

## 2022-07-12 ENCOUNTER — Other Ambulatory Visit: Payer: Self-pay | Admitting: Family Medicine

## 2022-07-12 DIAGNOSIS — E785 Hyperlipidemia, unspecified: Secondary | ICD-10-CM

## 2022-08-02 DIAGNOSIS — Z961 Presence of intraocular lens: Secondary | ICD-10-CM | POA: Diagnosis not present

## 2022-08-02 DIAGNOSIS — H35341 Macular cyst, hole, or pseudohole, right eye: Secondary | ICD-10-CM | POA: Diagnosis not present

## 2022-08-02 DIAGNOSIS — H40013 Open angle with borderline findings, low risk, bilateral: Secondary | ICD-10-CM | POA: Diagnosis not present

## 2022-08-16 ENCOUNTER — Ambulatory Visit (INDEPENDENT_AMBULATORY_CARE_PROVIDER_SITE_OTHER): Payer: PPO | Admitting: Family Medicine

## 2022-08-16 ENCOUNTER — Encounter: Payer: Self-pay | Admitting: Family Medicine

## 2022-08-16 VITALS — BP 142/71 | HR 69 | Temp 97.9°F | Ht 59.0 in | Wt 159.0 lb

## 2022-08-16 DIAGNOSIS — Z23 Encounter for immunization: Secondary | ICD-10-CM

## 2022-08-16 DIAGNOSIS — Z0001 Encounter for general adult medical examination with abnormal findings: Secondary | ICD-10-CM | POA: Diagnosis not present

## 2022-08-16 DIAGNOSIS — M810 Age-related osteoporosis without current pathological fracture: Secondary | ICD-10-CM | POA: Diagnosis not present

## 2022-08-16 DIAGNOSIS — K2100 Gastro-esophageal reflux disease with esophagitis, without bleeding: Secondary | ICD-10-CM

## 2022-08-16 DIAGNOSIS — I10 Essential (primary) hypertension: Secondary | ICD-10-CM

## 2022-08-16 DIAGNOSIS — E785 Hyperlipidemia, unspecified: Secondary | ICD-10-CM

## 2022-08-16 DIAGNOSIS — Z0000 Encounter for general adult medical examination without abnormal findings: Secondary | ICD-10-CM

## 2022-08-16 DIAGNOSIS — R7989 Other specified abnormal findings of blood chemistry: Secondary | ICD-10-CM | POA: Diagnosis not present

## 2022-08-16 MED ORDER — ATORVASTATIN CALCIUM 40 MG PO TABS: 40.0000 mg | ORAL_TABLET | Freq: Every day | ORAL | 3 refills | Status: DC

## 2022-08-16 MED ORDER — PANTOPRAZOLE SODIUM 40 MG PO TBEC: 40.0000 mg | DELAYED_RELEASE_TABLET | Freq: Every day | ORAL | 3 refills | Status: DC

## 2022-08-16 MED ORDER — AMLODIPINE BESYLATE 10 MG PO TABS: 10.0000 mg | ORAL_TABLET | Freq: Every day | ORAL | 3 refills | Status: DC

## 2022-08-16 NOTE — Progress Notes (Signed)
 Deborah Pruitt is a 81 y.o. female presents to office today for annual physical exam examination.    Concerns today include: 1.  None.  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  She continues to suffer with left-sided lower extremity edema and uses compression hose for this.  She is doing relatively well and has no complaints today.  Occupation: retired but very involved with community and church, Marital status: married, Substance use: none Diet: typical american, Exercise: no structured but is active Last eye exam: has optometrist and retinal specialist ([facility]) for right retinal tear Last dental exam: UTD Last colonoscopy: UTD Last mammogram: needs to schedule Last pap smear: n/a Refills needed today: all Immunizations needed: Flu Immunization History  Administered Date(s) Administered   Fluad Quad(high Dose 65+) 06/25/2019   Influenza, High Dose Seasonal PF 08/23/2017, 08/02/2018   Influenza-Unspecified 08/23/2017, 08/02/2018   Moderna Sars-Covid-2 Vaccination 11/19/2019, 12/17/2019   Pneumococcal Conjugate-13 05/12/2016   Pneumococcal Polysaccharide-23 06/25/2019   Tdap 08/19/2013     Past Medical History:  Diagnosis Date   Allergy baby   Arthritis    Asthma    Cataract 2015   Complication of anesthesia    Ileus post hip replacement   GERD (gastroesophageal reflux disease)    Hyperlipidemia    Hypertension    Urinary tract infection 2012   Social History   Socioeconomic History   Marital status: Married    Spouse name: Doug   Number of children: 2   Years of education: 14   Highest education level: Some college, no degree  Occupational History   Occupation: Retired    Comment: accountant  Tobacco Use   Smoking status: Never   Smokeless tobacco: Never  Vaping Use   Vaping Use: Never used  Substance and Sexual Activity   Alcohol use: No   Drug use: No   Sexual activity: Yes     Birth control/protection: Post-menopausal  Other Topics Concern   Not on file  Social History Narrative   The patient is recently retired, 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 She is adapting to retirement.      She is married with one son and one daughter. No alcohol caffeine or tobacco.      Children live hours away - daughter near the [place], son opposite side of [place]      Very involved in church groups, travelling, volunteering, etc   Social Determinants of Health   Financial Resource Strain: Low Risk  (03/20/2022)   Overall Financial Resource Strain (CARDIA)    Difficulty of Paying Living Expenses: Not hard at all  Food Insecurity: No Food Insecurity (03/20/2022)   Hunger Vital Sign    Worried About Running Out of Food in the Last Year: Never true    Ran Out of Food in the Last Year: Never true  Transportation Needs: No Transportation Needs (03/20/2022)   PRAPARE - Transportation    Lack of Transportation (Medical): No    Lack of Transportation (Non-Medical): No  Physical Activity: Sufficiently Active (03/20/2022)   Exercise Vital Sign    Days of Exercise per Week: 7 days    Minutes of Exercise per Session: 30 min  Stress: No Stress Concern Present (03/20/2022)   [employer] - Occupational Stress Questionnaire    Feeling of Stress : Only a little  Social Connections: Socially Integrated (03/20/2022)     Social Licensed conveyancer [NHANES]    Frequency of Communication with Friends and Family: More than three times a week    Frequency of Social Gatherings with Friends and Family: Twice a week    Attends Religious Services: More than 4 times per year    Active Member of [employer] or Organizations: Yes    Attends Archivist Meetings: More than 4 times per year    Marital Status: Married  Human resources officer Violence: Not At Risk (03/20/2022)   Humiliation, Afraid, Rape, and Kick questionnaire    Fear of  Current or Ex-Partner: No    Emotionally Abused: No    Physically Abused: No    Sexually Abused: No   Past Surgical History:  Procedure Laterality Date   CATARACT EXTRACTION W/PHACO Right 07/23/2014   Procedure: CATARACT EXTRACTION PHACO AND INTRAOCULAR LENS PLACEMENT RIGHT EYE CDE=6.24;  Surgeon: Tonny Branch, MD;  Location: [facility];  Service: Ophthalmology;  Laterality: Right;   CATARACT EXTRACTION W/PHACO Left 08/17/2014   Procedure: CATARACT EXTRACTION PHACO AND INTRAOCULAR LENS PLACEMENT LEFT EYE;  Surgeon: Tonny Branch, MD;  Location: [facility];  Service: Ophthalmology;  Laterality: Left;  CDE:4.65   CHOLECYSTECTOMY     EYE SURGERY  2015, 2019   cataract, eyelids to help vision   JOINT REPLACEMENT Left    hip   TOTAL HIP ARTHROPLASTY Left 1999   Family History  Problem Relation Age of Onset   Stroke Mother 21   Hypertension Mother    Heart disease Mother    Arthritis Mother    Asthma Mother    Heart disease Father    Diabetes Father    Cancer Father    Hearing loss Father    Stomach cancer Maternal Grandmother    Cancer Maternal Grandmother    Liver cancer Brother    Cancer Brother    Heart disease Brother    Lung cancer Brother    Colon cancer Neg Hx    Colon polyps Neg Hx    Esophageal cancer Neg Hx    Breast cancer Neg Hx     Current Outpatient Medications:    amLODipine (NORVASC) 10 MG tablet, Take 1 tablet (10 mg total) by mouth daily., Disp: 90 tablet, Rfl: 3   atorvastatin (LIPITOR) 40 MG tablet, Take 1 tablet (40 mg total) by mouth daily., Disp: 30 tablet, Rfl: 0   Calcium-Phosphorus-Vitamin D (CALCIUM GUMMIES PO), Take by mouth., Disp: , Rfl:    Cholecalciferol (VITAMIN D3) 2000 UNITS TABS, Take 1 tablet by mouth daily., Disp: , Rfl:    Cyanocobalamin (VITAMIN B 12 PO), Take 1 tablet by mouth daily., Disp: , Rfl:    Multiple Vitamin (MULTIVITAMIN WITH MINERALS) TABS tablet, Take 2 tablets by mouth daily., Disp: , Rfl:    Multiple Vitamins-Minerals (AIRBORNE PO),  Take by mouth., Disp: , Rfl:    pantoprazole (PROTONIX) 40 MG tablet, Take 1 tablet (40 mg total) by mouth daily. (NEEDS TO BE SEEN BEFORE NEXT REFILL), Disp: 90 tablet, Rfl: 0   RESTASIS 0.05 % ophthalmic emulsion, , Disp: , Rfl:   Allergies  Allergen Reactions   Milk-Related Compounds Other (See Comments)    "I almost died"   Ciprofloxacin Hives and Swelling   Dexilant [Dexlansoprazole] Hives and Swelling   Penicillins Other (See Comments)    Childhood allergy.   Vioxx [Rofecoxib] Hives and Swelling     ROS: Review of Systems Pertinent items noted in HPI and remainder of comprehensive ROS otherwise negative.  Physical exam BP (!) 142/71   Pulse 69   Temp 97.9 F (36.6 C)   Ht 4' 11" (1.499 m)   Wt 159 lb (72.1 kg)   SpO2 96%   BMI 32.11 kg/m  General appearance: alert, cooperative, appears stated age, and no distress Head: Normocephalic, without obvious abnormality, atraumatic Eyes: negative findings: lids and lashes normal, conjunctivae and sclerae normal, corneas clear, and pupils equal, round, reactive to light and accomodation Ears: normal TM's and external ear canals both ears Nose: Nares normal. Septum midline. Mucosa normal. No drainage or sinus tenderness. Throat: lips, mucosa, and tongue normal; teeth and gums normal Neck: no adenopathy, no carotid bruit, supple, symmetrical, trachea midline, and thyroid not enlarged, symmetric, no tenderness/mass/nodules Back: symmetric, no curvature. ROM normal. No CVA tenderness. Lungs: clear to auscultation bilaterally Heart: regular rate and rhythm, S1, S2 normal, no murmur, click, rub or gallop Abdomen: soft, non-tender; bowel sounds normal; no masses,  no organomegaly Extremities: extremities normal, atraumatic, no cyanosis. +1 ankle edema on left Pulses: 2+ and symmetric Skin: Skin color, texture, turgor normal. No rashes or lesions Lymph nodes: Cervical, supraclavicular, and axillary nodes normal. Neurologic: Grossly  normal Psych: Mood stable, speech normal, affect appropriate.  Very pleasant, interactive.  Flowsheet Row Office Visit from 08/16/2022 in [facility]  PHQ-2 Total Score 0      Assessment/ Plan: Yolunda C Murrillo here for annual physical exam.   Annual physical exam  Essential hypertension - Plan: CMP14+EGFR, amLODipine (NORVASC) 10 MG tablet  Hyperlipidemia with target LDL less than 100 - Plan: CMP14+EGFR, TSH, Lipid Panel, atorvastatin (LIPITOR) 40 MG tablet  Low serum vitamin D - Plan: VITAMIN D 25 Hydroxy (Vit-D Deficiency, Fractures)  Age-related osteoporosis without current pathological fracture - Plan: CMP14+EGFR, VITAMIN D 25 Hydroxy (Vit-D Deficiency, Fractures), TSH, CBC  Need for immunization against influenza - Plan: Flu Vaccine QUAD High Dose(Fluad)  Gastroesophageal reflux disease with esophagitis without hemorrhage - Plan: pantoprazole (PROTONIX) 40 MG tablet  Influenza vaccination administered and she will schedule mammogram.  She is otherwise up-to-date on preventative health care  Blood pressure at goal for age.  Continue current regimen.  Refill has been sent.  Fasting labs ordered.  Continue statin.  Check vitamin D level given history of low vitamin D and osteoporosis.  Not yet due for DEXA scan.  GERD is chronic and stable.  PPI renewed  Counseled on healthy lifestyle choices, including diet (rich in fruits, vegetables and lean meats and low in salt and simple carbohydrates) and exercise (at least 30 minutes of moderate physical activity daily).  Patient to follow up in 1 year for annual exam or sooner if needed.  Ashly M. Gottschalk, DO       

## 2022-08-17 LAB — LIPID PANEL
Chol/HDL Ratio: 3.5 ratio (ref 0.0–4.4)
Cholesterol, Total: 115 mg/dL (ref 100–199)
HDL: 33 mg/dL — ABNORMAL LOW (ref >39)
LDL Chol Calc (NIH): 55 mg/dL (ref 0–99)
Triglycerides: 156 mg/dL — ABNORMAL HIGH (ref 0–149)
VLDL Cholesterol Cal: 27 mg/dL (ref 5–40)

## 2022-08-17 LAB — CMP14+EGFR
ALT: 20 IU/L (ref 0–32)
AST: 22 IU/L (ref 0–40)
Albumin/Globulin Ratio: 2 (ref 1.2–2.2)
Albumin: 4.6 g/dL (ref 3.8–4.8)
Alkaline Phosphatase: 88 IU/L (ref 44–121)
BUN/Creatinine Ratio: 16 (ref 12–28)
BUN: 12 mg/dL (ref 8–27)
Bilirubin Total: 0.8 mg/dL (ref 0.0–1.2)
CO2: 25 mmol/L (ref 20–29)
Calcium: 9.5 mg/dL (ref 8.7–10.3)
Chloride: 101 mmol/L (ref 96–106)
Creatinine, Ser: 0.77 mg/dL (ref 0.57–1.00)
Globulin, Total: 2.3 g/dL (ref 1.5–4.5)
Glucose: 106 mg/dL — ABNORMAL HIGH (ref 70–99)
Potassium: 3.6 mmol/L (ref 3.5–5.2)
Sodium: 142 mmol/L (ref 134–144)
Total Protein: 6.9 g/dL (ref 6.0–8.5)
eGFR: 78 mL/min/{1.73_m2} (ref >59)

## 2022-08-17 LAB — CBC
Hematocrit: 38 % (ref 34.0–46.6)
Hemoglobin: 13.3 g/dL (ref 11.1–15.9)
MCH: 32.3 pg (ref 26.6–33.0)
MCHC: 35 g/dL (ref 31.5–35.7)
MCV: 92 fL (ref 79–97)
Platelets: 252 10*3/uL (ref 150–450)
RBC: 4.12 x10E6/uL (ref 3.77–5.28)
RDW: 12.8 % (ref 11.7–15.4)
WBC: 8.1 10*3/uL (ref 3.4–10.8)

## 2022-08-17 LAB — TSH: TSH: 0.681 u[IU]/mL (ref 0.450–4.500)

## 2022-08-17 LAB — VITAMIN D 25 HYDROXY (VIT D DEFICIENCY, FRACTURES): Vit D, 25-Hydroxy: 55.1 ng/mL (ref 30.0–100.0)

## 2022-09-12 ENCOUNTER — Other Ambulatory Visit: Payer: Self-pay | Admitting: Family Medicine

## 2022-09-12 DIAGNOSIS — Z1231 Encounter for screening mammogram for malignant neoplasm of breast: Secondary | ICD-10-CM

## 2022-10-02 ENCOUNTER — Ambulatory Visit
Admission: RE | Admit: 2022-10-02 | Discharge: 2022-10-02 | Disposition: A | Discharge status: Home / Self Care | Payer: PPO | Source: Ambulatory Visit | Attending: Family Medicine | Admitting: Family Medicine

## 2022-10-02 DIAGNOSIS — Z1231 Encounter for screening mammogram for malignant neoplasm of breast: Secondary | ICD-10-CM | POA: Diagnosis not present

## 2023-01-04 DIAGNOSIS — M25551 Pain in right hip: Secondary | ICD-10-CM | POA: Diagnosis not present

## 2023-01-04 DIAGNOSIS — M25561 Pain in right knee: Secondary | ICD-10-CM | POA: Diagnosis not present

## 2023-02-07 DIAGNOSIS — Z96642 Presence of left artificial hip joint: Secondary | ICD-10-CM | POA: Diagnosis not present

## 2023-02-07 DIAGNOSIS — M1611 Unilateral primary osteoarthritis, right hip: Secondary | ICD-10-CM | POA: Diagnosis not present

## 2023-02-07 DIAGNOSIS — M1711 Unilateral primary osteoarthritis, right knee: Secondary | ICD-10-CM | POA: Diagnosis not present

## 2023-03-21 DIAGNOSIS — M1611 Unilateral primary osteoarthritis, right hip: Secondary | ICD-10-CM | POA: Diagnosis not present

## 2023-03-21 DIAGNOSIS — M1711 Unilateral primary osteoarthritis, right knee: Secondary | ICD-10-CM | POA: Diagnosis not present

## 2023-03-22 ENCOUNTER — Ambulatory Visit (INDEPENDENT_AMBULATORY_CARE_PROVIDER_SITE_OTHER): Payer: PPO

## 2023-03-22 VITALS — Ht 59.0 in | Wt 155.0 lb

## 2023-03-22 DIAGNOSIS — Z0000 Encounter for general adult medical examination without abnormal findings: Secondary | ICD-10-CM

## 2023-03-22 NOTE — Progress Notes (Signed)
Subjective:   Deborah Pruitt is a 82 y.o. female who presents for Medicare Annual (Subsequent) preventive examination.  Visit Complete: Virtual  I connected with  Deborah Pruitt on 03/22/23 by a audio enabled telemedicine application and verified that I am speaking with the correct person using two identifiers.  Patient Location: Home  Provider Location: Home Office  I discussed the limitations of evaluation and management by telemedicine. The patient expressed understanding and agreed to proceed.  Patient Medicare AWV questionnaire was completed by the patient on 03/22/2023; I have confirmed that all information answered by patient is correct and no changes since this date.  Review of Systems     Cardiac Risk Factors include: advanced age (>65men, >35 women);dyslipidemia     Objective:    Today's Vitals   03/22/23 1206  Weight: 155 lb (70.3 kg)  Height: 4\' 11"  (1.499 m)   Body mass index is 31.31 kg/m.     03/22/2023   12:10 PM 03/20/2022   12:14 PM 03/18/2021    9:20 AM 03/17/2020    8:44 AM 03/07/2019   10:46 AM 02/05/2018    2:45 PM 11/28/2017    8:36 PM  Advanced Directives  Does Patient Have a Medical Advance Directive? Yes Yes Yes Yes Yes Yes Yes  Type of Estate agent of [place];Living will Healthcare Power of [place];Living will Healthcare Power of [place];Living will Healthcare Power of [place];Living will Healthcare Power of [place];Living will Healthcare Power of [place];Living will Healthcare Power of [place];Living will  Does patient want to make changes to medical advance directive?    No - Patient declined No - Patient declined  No - Patient declined  Copy of Healthcare Power of Attorney in Chart? No - copy requested No - copy requested No - copy requested No - copy requested No - copy requested No - copy requested No - copy requested    Current Medications (verified) Outpatient Encounter Medications as of 03/22/2023   Medication Sig   amLODipine (NORVASC) 10 MG tablet Take 1 tablet (10 mg total) by mouth daily.   atorvastatin (LIPITOR) 40 MG tablet Take 1 tablet (40 mg total) by mouth daily.   Calcium-Phosphorus-Vitamin D (CALCIUM GUMMIES PO) Take by mouth.   Cholecalciferol (VITAMIN D3) 2000 UNITS TABS Take 1 tablet by mouth daily.   Cyanocobalamin (VITAMIN B 12 PO) Take 1 tablet by mouth daily.   Multiple Vitamin (MULTIVITAMIN WITH MINERALS) TABS tablet Take 2 tablets by mouth daily.   Multiple Vitamins-Minerals (AIRBORNE PO) Take by mouth.   pantoprazole (PROTONIX) 40 MG tablet Take 1 tablet (40 mg total) by mouth daily.   RESTASIS 0.05 % ophthalmic emulsion    No facility-administered encounter medications on file as of 03/22/2023.    Allergies (verified) Milk-related compounds, Ciprofloxacin, Dexilant [dexlansoprazole], Penicillins, and Vioxx [rofecoxib]   History: Past Medical History:  Diagnosis Date   Allergy baby   Arthritis    Asthma    Cataract 2015   Complication of anesthesia    Ileus post hip replacement   GERD (gastroesophageal reflux disease)    Hyperlipidemia    Hypertension    Urinary tract infection 2012   Past Surgical History:  Procedure Laterality Date   CATARACT EXTRACTION W/PHACO Right 07/23/2014   Procedure: CATARACT EXTRACTION PHACO AND INTRAOCULAR LENS PLACEMENT RIGHT EYE CDE=6.24;  Surgeon: Gemma Payor, MD;  Location: [facility];  Service: Ophthalmology;  Laterality: Right;   CATARACT EXTRACTION W/PHACO Left 08/17/2014   Procedure: CATARACT EXTRACTION PHACO AND  INTRAOCULAR LENS PLACEMENT LEFT EYE;  Surgeon: Gemma Payor, MD;  Location: [facility];  Service: Ophthalmology;  Laterality: Left;  CDE:4.65   CHOLECYSTECTOMY     EYE SURGERY  2015, 2019   cataract, eyelids to help vision   JOINT REPLACEMENT Left    hip   TOTAL HIP ARTHROPLASTY Left 1999   Family History  Problem Relation Age of Onset   Stroke Mother 63   Hypertension Mother    Heart disease Mother     Arthritis Mother    Asthma Mother    Heart disease Father    Diabetes Father    Cancer Father    Hearing loss Father    Stomach cancer Maternal Grandmother    Cancer Maternal Grandmother    Liver cancer Brother    Cancer Brother    Heart disease Brother    Lung cancer Brother    Colon cancer Neg Hx    Colon polyps Neg Hx    Esophageal cancer Neg Hx    Breast cancer Neg Hx    Social History   Socioeconomic History   Marital status: Married    Spouse name: Doug   Number of children: 2   Years of education: 14   Highest education level: Some college, no degree  Occupational History   Occupation: Retired    Comment: Airline pilot  Tobacco Use   Smoking status: Never   Smokeless tobacco: Never  Vaping Use   Vaping Use: Never used  Substance and Sexual Activity   Alcohol use: No   Drug use: No   Sexual activity: Yes    Birth control/protection: Post-menopausal  Other Topics Concern   Not on file  Social History Narrative   The patient is recently retired, 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      She is married with one son and one daughter. No alcohol caffeine or tobacco.      Children live hours away - daughter near the [facility], son opposite side of [place]      Very involved in church groups, travelling, volunteering, etc   Social Determinants of Health   Financial Resource Strain: Low Risk  (03/22/2023)   Overall Financial Resource Strain (CARDIA)    Difficulty of Paying Living Expenses: Not hard at all  Food Insecurity: No Food Insecurity (03/22/2023)   Hunger Vital Sign    Worried About Running Out of Food in the Last Year: Never true    Ran Out of Food in the Last Year: Never true  Transportation Needs: No Transportation Needs (03/22/2023)   PRAPARE - Administrator, Civil Service (Medical): No    Lack of Transportation (Non-Medical): No  Physical Activity: Insufficiently Active (03/22/2023)    Exercise Vital Sign    Days of Exercise per Week: 3 days    Minutes of Exercise per Session: 30 min  Stress: No Stress Concern Present (03/22/2023)   [employer] of Occupational Health - Occupational Stress Questionnaire    Feeling of Stress : Not at all  Social Connections: Socially Integrated (03/22/2023)   Social Connection and Isolation Panel [NHANES]    Frequency of Communication with Friends and Family: More than three times a week    Frequency of Social Gatherings with Friends and Family: More than three times a week    Attends Religious Services: More than 4 times per year    Active Member of [employer] or Organizations: Yes  Attends Engineer, structural: More than 4 times per year    Marital Status: Married    Tobacco Counseling Counseling given: Not Answered   Clinical Intake:  Pre-visit preparation completed: Yes  Pain : No/denies pain     Nutritional Risks: None Diabetes: No  How often do you need to have someone help you when you read instructions, pamphlets, or other written materials from your doctor or pharmacy?: 1 - Never  Interpreter Needed?: No  Information entered by :: Renie Ora, LPN   Activities of Daily Living    03/22/2023   12:10 PM 03/19/2023    5:28 PM  In your present state of health, do you have any difficulty performing the following activities:  Hearing? 0 0  Vision? 0 0  Difficulty concentrating or making decisions? 0 0  Walking or climbing stairs? 0 1  Dressing or bathing? 0 0  Doing errands, shopping? 0 0  Preparing Food and eating ? N N  Using the Toilet? N N  In the past six months, have you accidently leaked urine? N N  Do you have problems with loss of bowel control? N N  Managing your Medications? N N  Managing your Finances? N N  Housekeeping or managing your Housekeeping? N N    Patient Care Team: Raliegh Ip, DO as PCP - General (Family Medicine)  Indicate any recent Medical Services you may  have received from other than [facility] providers in the past year (date may be approximate).     Assessment:   This is a routine wellness examination for Br[place].  Hearing/Vision screen Vision Screening - Comments:: Wears rx glasses - up to date with routine eye exams with  Dr.Johnson   Dietary issues and exercise activities discussed:     Goals Addressed             This Visit's Progress    DIET - INCREASE WATER INTAKE   On track    Try to drink 6-8 glasses of water daily.       Depression Screen    03/22/2023   12:09 PM 08/16/2022   11:14 AM 03/20/2022   12:07 PM 05/13/2021    1:29 PM 03/18/2021    9:02 AM 05/24/2020    1:23 PM 03/17/2020    8:37 AM  PHQ 2/9 Scores  PHQ - 2 Score 0 0 0 0 0 0 0  PHQ- 9 Score    1 3      Fall Risk    03/22/2023   12:07 PM 03/19/2023    5:28 PM 08/16/2022   11:14 AM 03/20/2022   12:04 PM 05/13/2021    1:29 PM  Fall Risk   Falls in the past year? 0 0 0 0 0  Number falls in past yr: 0   0   Injury with Fall? 0   0   Risk for fall due to : No Fall Risks   Orthopedic patient   Follow up Falls prevention discussed   Falls prevention discussed     MEDICARE RISK AT HOME:  Medicare Risk at Home - 03/22/23 1207     Any stairs in or around the home? Yes    If so, are there any without handrails? No    Home free of loose throw rugs in walkways, pet beds, electrical cords, etc? Yes    Adequate lighting in your home to reduce risk of falls? Yes    Life alert? No    Use  of a cane, walker or w/c? No    Grab bars in the bathroom? Yes    Shower chair or bench in shower? Yes    Elevated toilet seat or a handicapped toilet? Yes             TIMED UP AND GO:  Was the test performed?  No    Cognitive Function:    02/05/2018    2:54 PM  MMSE - Mini Mental State Exam  Orientation to time 5  Orientation to Place 5  Registration 3  Attention/ Calculation 5  Recall 3  Language- name 2 objects 2  Language- repeat 1  Language- follow 3  step command 3  Language- read & follow direction 1  Write a sentence 1  Copy design 1  Total score 30        03/22/2023   12:11 PM 03/20/2022   12:14 PM 03/17/2020    8:47 AM 03/07/2019   10:48 AM  6CIT Screen  What Year? 0 points 0 points 0 points 0 points  What month? 0 points 0 points 0 points 0 points  What time? 0 points 0 points 0 points 0 points  Count back from 20 0 points 0 points 0 points 0 points  Months in reverse 0 points 0 points 0 points 0 points  Repeat phrase 0 points 0 points 0 points 0 points  Total Score 0 points 0 points 0 points 0 points    Immunizations Immunization History  Administered Date(s) Administered   Fluad Quad(high Dose 65+) 06/25/2019, 08/16/2022   Influenza, High Dose Seasonal PF 08/23/2017, 08/02/2018   Influenza-Unspecified 08/23/2017, 08/02/2018   Moderna Sars-Covid-2 Vaccination 11/19/2019, 12/17/2019   Pneumococcal Conjugate-13 05/12/2016   Pneumococcal Polysaccharide-23 06/25/2019   Tdap 08/19/2013    TDAP status: Up to date  Flu Vaccine status: Up to date  Pneumococcal vaccine status: Up to date  Covid-19 vaccine status: Completed vaccines  Qualifies for Shingles Vaccine? Yes   Zostavax completed No   Shingrix Completed?: No.    Education has been provided regarding the importance of this vaccine. Patient has been advised to call insurance company to determine out of pocket expense if they have not yet received this vaccine. Advised may also receive vaccine at local pharmacy or Health Dept. Verbalized acceptance and understanding.  Screening Tests Health Maintenance  Topic Date Due   Zoster Vaccines- Shingrix (1 of 2) Never done   COVID-19 Vaccine (3 - 2023-24 season) 05/26/2022   Hepatitis C Screening  08/15/2023 (Originally 05/28/1959)   INFLUENZA VACCINE  04/26/2023   DTaP/Tdap/Td (2 - Td or Tdap) 08/20/2023   MAMMOGRAM  10/03/2023   Medicare Annual Wellness (AWV)  03/21/2024   Pneumonia Vaccine 14+ Years old  Completed    DEXA SCAN  Completed   HPV VACCINES  Aged Out   Colonoscopy  Discontinued    Health Maintenance  Health Maintenance Due  Topic Date Due   Zoster Vaccines- Shingrix (1 of 2) Never done   COVID-19 Vaccine (3 - 2023-24 season) 05/26/2022    Colorectal cancer screening: No longer required.   Mammogram status: No longer required due to age.  Bone Density status: Completed 05/16/2021. Results reflect: Bone density results: OSTEOPOROSIS. Repeat every 2 years.  Lung Cancer Screening: (Low Dose CT Chest recommended if Age 25-80 years, 20 pack-year currently smoking OR have quit w/in 15years.) does not qualify.   Lung Cancer Screening Referral: n/a  Additional Screening:  Hepatitis C Screening: does not qualify;  Vision Screening: Recommended annual ophthalmology exams for early detection of glaucoma and other disorders of the eye. Is the patient up to date with their annual eye exam?  Yes  Who is the provider or what is the name of the office in which the patient attends annual eye exams? Dr.johnson  If pt is not established with a provider, would they like to be referred to a provider to establish care? No .   Dental Screening: Recommended annual dental exams for proper oral hygiene    Community Resource Referral / Chronic Care Management: CRR required this visit?  No   CCM required this visit?  No     Plan:     I have personally reviewed and noted the following in the patient's chart:   Medical and social history Use of alcohol, tobacco or illicit drugs  Current medications and supplements including opioid prescriptions. Patient is not currently taking opioid prescriptions. Functional ability and status Nutritional status Physical activity Advanced directives List of other physicians Hospitalizations, surgeries, and ER visits in previous 12 months Vitals Screenings to include cognitive, depression, and falls Referrals and appointments  In addition, I have  reviewed and discussed with patient certain preventive protocols, quality metrics, and best practice recommendations. A written personalized care plan for preventive services as well as general preventive health recommendations were provided to patient.     Lorrene Reid, LPN   [id number]/47/[id number]   After Visit Summary: (MyChart) Due to this being a telephonic visit, the after visit summary with patients personalized plan was offered to patient via MyChart   Nurse Notes: none

## 2023-03-22 NOTE — Patient Instructions (Signed)
[name] , Thank you for taking time to come for your Medicare Wellness Visit. I appreciate your ongoing commitment to your health goals. Please review the following plan we discussed and let me know if I can assist you in the future.   These are the goals we discussed:  Goals      DIET - INCREASE WATER INTAKE     Try to drink 6-8 glasses of water daily.     Exercise 150 min/wk Moderate Activity     Walking is a great option.        This is a list of the screening recommended for you and due dates:  Health Maintenance  Topic Date Due   Zoster (Shingles) Vaccine (1 of 2) Never done   COVID-19 Vaccine (3 - 2023-24 season) 05/26/2022   Hepatitis C Screening  08/15/2023*   Flu Shot  04/26/2023   DTaP/Tdap/Td vaccine (2 - Td or Tdap) 08/20/2023   Mammogram  10/03/2023   Medicare Annual Wellness Visit  03/21/2024   Pneumonia Vaccine  Completed   DEXA scan (bone density measurement)  Completed   HPV Vaccine  Aged Out   Colon Cancer Screening  Discontinued  *Topic was postponed. The date shown is not the original due date.    Advanced directives: Please bring a copy of your health care power of attorney and living will to the office to be added to your chart at your convenience.   Conditions/risks identified: Aim for 30 minutes of exercise or brisk walking, 6-8 glasses of water, and 5 servings of fruits and vegetables each day.   Next appointment: Follow up in one year for your annual wellness visit    Preventive Care 65 Years and Older, Female Preventive care refers to lifestyle choices and visits with your health care provider that can promote health and wellness. What does preventive care include? A yearly physical exam. This is also called an annual well check. Dental exams once or twice a year. Routine eye exams. Ask your health care provider how often you should have your eyes checked. Personal lifestyle choices, including: Daily care of your teeth and gums. Regular  physical activity. Eating a healthy diet. Avoiding tobacco and drug use. Limiting alcohol use. Practicing safe sex. Taking low-dose aspirin every day. Taking vitamin and mineral supplements as recommended by your health care provider. What happens during an annual well check? The services and screenings done by your health care provider during your annual well check will depend on your age, overall health, lifestyle risk factors, and family history of disease. Counseling  Your health care provider may ask you questions about your: Alcohol use. Tobacco use. Drug use. Emotional well-being. Home and relationship well-being. Sexual activity. Eating habits. History of falls. Memory and ability to understand (cognition). Work and work Astronomer. Reproductive health. Screening  You may have the following tests or measurements: Height, weight, and BMI. Blood pressure. Lipid and cholesterol levels. These may be checked every 5 years, or more frequently if you are over 62 years old. Skin check. Lung cancer screening. You may have this screening every year starting at age 33 if you have a 30-pack-year history of smoking and currently smoke or have quit within the past 15 years. Fecal occult blood test (FOBT) of the stool. You may have this test every year starting at age 69. Flexible sigmoidoscopy or colonoscopy. You may have a sigmoidoscopy every 5 years or a colonoscopy every 10 years starting at age 56. Hepatitis C blood  test. Hepatitis B blood test. Sexually transmitted disease (STD) testing. Diabetes screening. This is done by checking your blood sugar (glucose) after you have not eaten for a while (fasting). You may have this done every 1-3 years. Bone density scan. This is done to screen for osteoporosis. You may have this done starting at age 59. Mammogram. This may be done every 1-2 years. Talk to your health care provider about how often you should have regular mammograms. Talk  with your health care provider about your test results, treatment options, and if necessary, the need for more tests. Vaccines  Your health care provider may recommend certain vaccines, such as: Influenza vaccine. This is recommended every year. Tetanus, diphtheria, and acellular pertussis (Tdap, Td) vaccine. You may need a Td booster every 10 years. Zoster vaccine. You may need this after age 17. Pneumococcal 13-valent conjugate (PCV13) vaccine. One dose is recommended after age 52. Pneumococcal polysaccharide (PPSV23) vaccine. One dose is recommended after age 56. Talk to your health care provider about which screenings and vaccines you need and how often you need them. This information is not intended to replace advice given to you by your health care provider. Make sure you discuss any questions you have with your health care provider. Document Released: 10/08/2015 Document Revised: 05/31/2016 Document Reviewed: 07/13/2015 Elsevier Interactive Patient Education  2017 [place] Prevention in the Home Falls can cause injuries. They can happen to people of all ages. There are many things you can do to make your home safe and to help prevent falls. What can I do on the outside of my home? Regularly fix the edges of walkways and driveways and fix any cracks. Remove anything that might make you trip as you walk through a door, such as a raised step or threshold. Trim any bushes or trees on the path to your home. Use bright outdoor lighting. Clear any walking paths of anything that might make someone trip, such as rocks or tools. Regularly check to see if handrails are loose or broken. Make sure that both sides of any steps have handrails. Any raised decks and porches should have guardrails on the edges. Have any leaves, snow, or ice cleared regularly. Use sand or salt on walking paths during winter. Clean up any spills in your garage right away. This includes oil or grease  spills. What can I do in the bathroom? Use night lights. Install grab bars by the toilet and in the tub and shower. Do not use towel bars as grab bars. Use non-skid mats or decals in the tub or shower. If you need to sit down in the shower, use a plastic, non-slip stool. Keep the floor dry. Clean up any water that spills on the floor as soon as it happens. Remove soap buildup in the tub or shower regularly. Attach bath mats securely with double-sided non-slip rug tape. Do not have throw rugs and other things on the floor that can make you trip. What can I do in the bedroom? Use night lights. Make sure that you have a light by your bed that is easy to reach. Do not use any sheets or blankets that are too big for your bed. They should not hang down onto the floor. Have a firm chair that has side arms. You can use this for support while you get dressed. Do not have throw rugs and other things on the floor that can make you trip. What can I do in the kitchen? Clean  up any spills right away. Avoid walking on wet floors. Keep items that you use a lot in easy-to-reach places. If you need to reach something above you, use a strong step stool that has a grab bar. Keep electrical cords out of the way. Do not use floor polish or wax that makes floors slippery. If you must use wax, use non-skid floor wax. Do not have throw rugs and other things on the floor that can make you trip. What can I do with my stairs? Do not leave any items on the stairs. Make sure that there are handrails on both sides of the stairs and use them. Fix handrails that are broken or loose. Make sure that handrails are as long as the stairways. Check any carpeting to make sure that it is firmly attached to the stairs. Fix any carpet that is loose or worn. Avoid having throw rugs at the top or bottom of the stairs. If you do have throw rugs, attach them to the floor with carpet tape. Make sure that you have a light switch at the  top of the stairs and the bottom of the stairs. If you do not have them, ask someone to add them for you. What else can I do to help prevent falls? Wear shoes that: Do not have high heels. Have rubber bottoms. Are comfortable and fit you well. Are closed at the toe. Do not wear sandals. If you use a stepladder: Make sure that it is fully opened. Do not climb a closed stepladder. Make sure that both sides of the stepladder are locked into place. Ask someone to hold it for you, if possible. Clearly mark and make sure that you can see: Any grab bars or handrails. First and last steps. Where the edge of each step is. Use tools that help you move around (mobility aids) if they are needed. These include: Canes. Walkers. Scooters. Crutches. Turn on the lights when you go into a dark area. Replace any light bulbs as soon as they burn out. Set up your furniture so you have a clear path. Avoid moving your furniture around. If any of your floors are uneven, fix them. If there are any pets around you, be aware of where they are. Review your medicines with your doctor. Some medicines can make you feel dizzy. This can increase your chance of falling. Ask your doctor what other things that you can do to help prevent falls. This information is not intended to replace advice given to you by your health care provider. Make sure you discuss any questions you have with your health care provider. Document Released: 07/08/2009 Document Revised: 02/17/2016 Document Reviewed: 10/16/2014 Elsevier Interactive Patient Education  2017 [employer].

## 2023-03-28 DIAGNOSIS — M25551 Pain in right hip: Secondary | ICD-10-CM | POA: Diagnosis not present

## 2023-05-02 DIAGNOSIS — M1711 Unilateral primary osteoarthritis, right knee: Secondary | ICD-10-CM | POA: Diagnosis not present

## 2023-05-02 DIAGNOSIS — M1611 Unilateral primary osteoarthritis, right hip: Secondary | ICD-10-CM | POA: Diagnosis not present

## 2023-05-16 ENCOUNTER — Other Ambulatory Visit: Payer: Self-pay | Admitting: Family Medicine

## 2023-05-16 DIAGNOSIS — E785 Hyperlipidemia, unspecified: Secondary | ICD-10-CM

## 2023-06-19 ENCOUNTER — Encounter: Payer: Self-pay | Admitting: Nurse Practitioner

## 2023-06-19 ENCOUNTER — Ambulatory Visit: Payer: PPO | Admitting: Nurse Practitioner

## 2023-06-19 VITALS — BP 144/66 | HR 68 | Temp 97.8°F | Resp 20 | Ht 59.0 in | Wt 160.0 lb

## 2023-06-19 DIAGNOSIS — Z23 Encounter for immunization: Secondary | ICD-10-CM | POA: Diagnosis not present

## 2023-06-19 DIAGNOSIS — R600 Localized edema: Secondary | ICD-10-CM

## 2023-06-19 MED ORDER — FUROSEMIDE 20 MG PO TABS: 20.0000 mg | ORAL_TABLET | Freq: Every day | ORAL | 3 refills | Status: DC

## 2023-06-19 NOTE — Progress Notes (Signed)
Subjective:    Patient ID: Deborah Pruitt, female    DOB: 1941-04-07, 82 y.o.   MRN: [id number]   Chief Complaint: Leg Swelling   Patient in c/o peripheral edema that started several weeks ago. She says it goes down during th enight and comes back during the day. She denies any SOB.      Patient Active Problem List   Diagnosis Date Noted   Verrucous skin lesion 10/17/2019   Hypokalemia    Femur fracture (HCC) 11/28/2017   Hip fracture, left (HCC) 11/28/2017   Hip fracture (HCC) 11/28/2017   Essential hypertension 08/23/2017   Healthcare maintenance 05/12/2016   Low serum vitamin D 08/26/2014   Osteoporosis 08/26/2014   Hyperlipidemia with target LDL less than 100 08/19/2013   GERD (gastroesophageal reflux disease) 08/19/2013       Review of Systems  Constitutional:  Negative for diaphoresis.  Eyes:  Negative for pain.  Respiratory:  Negative for shortness of breath.   Cardiovascular:  Positive for leg swelling (bil). Negative for chest pain and palpitations.  Gastrointestinal:  Negative for abdominal pain.  Endocrine: Negative for polydipsia.  Skin:  Negative for rash.  Neurological:  Negative for dizziness, weakness and headaches.  Hematological:  Does not bruise/bleed easily.  All other systems reviewed and are negative.      Objective:   Physical Exam Constitutional:      Appearance: Normal appearance.  Cardiovascular:     Rate and Rhythm: Normal rate.     Heart sounds: Normal heart sounds.  Pulmonary:     Breath sounds: Normal breath sounds.  Musculoskeletal:     Right lower leg: Edema (3+) present.     Left lower leg: Edema (3+) present.  Skin:    General: Skin is warm.  Neurological:     General: No focal deficit present.     Mental Status: She is alert and oriented to person, place, and time.  Psychiatric:        Mood and Affect: Mood normal.        Behavior: Behavior normal.    BP (!) 144/66   Pulse 68   Temp 97.8 F (36.6 C)  (Temporal)   Resp 20   Ht 4\' 11"  (1.499 m)   Wt 160 lb (72.6 kg)   SpO2 96%   BMI 32.32 kg/m         Assessment & Plan:   Deborah Pruitt in today with chief complaint of Leg Swelling   1. Peripheral edema Compression socks Elevate legs when sitting RTO prn - furosemide (LASIX) 20 MG tablet; Take 1 tablet (20 mg total) by mouth daily.  Dispense: 30 tablet; Refill: 3    The above assessment and management plan was discussed with the patient. The patient verbalized understanding of and has agreed to the management plan. Patient is aware to call the clinic if symptoms persist or worsen. Patient is aware when to return to the clinic for a follow-up visit. Patient educated on when it is appropriate to go to the emergency department.   Mary-Margaret Daphine Deutscher, FNP

## 2023-06-19 NOTE — Patient Instructions (Signed)
Peripheral Edema  Peripheral edema is swelling that is caused by a buildup of fluid. Peripheral edema most often affects the lower legs, ankles, and feet. It can also develop in the arms, hands, and face. The area of the body that has peripheral edema will look swollen. It may also feel heavy or warm. Your clothes may start to feel tight. Pressing on the area may make a temporary dent in your skin (pitting edema). You may not be able to move your swollen arm or leg as much as usual. There are many causes of peripheral edema. It can happen because of a complication of other conditions such as heart failure, kidney disease, or a problem with your circulation. It also can be a side effect of certain medicines or happen because of an infection. It often happens to women during pregnancy. Sometimes, the cause is not known. Follow these instructions at home: Managing pain, stiffness, and swelling  Raise (elevate) your legs while you are sitting or lying down. Move around often to prevent stiffness and to reduce swelling. Do not sit or stand for long periods of time. Do not wear tight clothing. Do not wear garters on your upper legs. Exercise your legs to get your circulation going. This helps to move the fluid back into your blood vessels, and it may help the swelling go down. Wear compression stockings as told by your health care provider. These stockings help to prevent blood clots and reduce swelling in your legs. It is important that these are the correct size. These stockings should be prescribed by your doctor to prevent possible injuries. If elastic bandages or wraps are recommended, use them as told by your health care provider. Medicines Take over-the-counter and prescription medicines only as told by your health care provider. Your health care provider may prescribe medicine to help your body get rid of excess water (diuretic). Take this medicine if you are told to take it. General  instructions Eat a low-salt (low-sodium) diet as told by your health care provider. Sometimes, eating less salt may reduce swelling. Pay attention to any changes in your symptoms. Moisturize your skin daily to help prevent skin from cracking and draining. Keep all follow-up visits. This is important. Contact a health care provider if: You have a fever. You have swelling in only one leg. You have increased swelling, redness, or pain in one or both of your legs. You have drainage or sores at the area where you have edema. Get help right away if: You have edema that starts suddenly or is getting worse, especially if you are pregnant or have a medical condition. You develop shortness of breath, especially when you are lying down. You have pain in your chest or abdomen. You feel weak. You feel like you will faint. These symptoms may be an emergency. Get help right away. Call 911. Do not wait to see if the symptoms will go away. Do not drive yourself to the hospital. Summary Peripheral edema is swelling that is caused by a buildup of fluid. Peripheral edema most often affects the lower legs, ankles, and feet. Move around often to prevent stiffness and to reduce swelling. Do not sit or stand for long periods of time. Pay attention to any changes in your symptoms. Contact a health care provider if you have edema that starts suddenly or is getting worse, especially if you are pregnant or have a medical condition. Get help right away if you develop shortness of breath, especially when lying down.  This information is not intended to replace advice given to you by your health care provider. Make sure you discuss any questions you have with your health care provider. Document Revised: 05/16/2021 Document Reviewed: 05/16/2021 Elsevier Patient Education  2024 [employer].

## 2023-06-20 DIAGNOSIS — Z23 Encounter for immunization: Secondary | ICD-10-CM

## 2023-06-20 DIAGNOSIS — R600 Localized edema: Secondary | ICD-10-CM | POA: Diagnosis not present

## 2023-06-20 LAB — BMP8+EGFR
BUN/Creatinine Ratio: 13 (ref 12–28)
BUN: 12 mg/dL (ref 8–27)
CO2: 24 mmol/L (ref 20–29)
Calcium: 9.6 mg/dL (ref 8.7–10.3)
Chloride: 103 mmol/L (ref 96–106)
Creatinine, Ser: 0.93 mg/dL (ref 0.57–1.00)
Glucose: 92 mg/dL (ref 70–99)
Potassium: 4.3 mmol/L (ref 3.5–5.2)
Sodium: 142 mmol/L (ref 134–144)
eGFR: 62 mL/min/{1.73_m2} (ref >59)

## 2023-06-29 ENCOUNTER — Ambulatory Visit: Payer: PPO | Admitting: Family Medicine

## 2023-07-01 ENCOUNTER — Emergency Department (HOSPITAL_BASED_OUTPATIENT_CLINIC_OR_DEPARTMENT_OTHER): Payer: PPO

## 2023-07-01 ENCOUNTER — Other Ambulatory Visit: Payer: Self-pay

## 2023-07-01 ENCOUNTER — Emergency Department (HOSPITAL_BASED_OUTPATIENT_CLINIC_OR_DEPARTMENT_OTHER)
Admission: EM | Admit: 2023-07-01 | Discharge: 2023-07-01 | Disposition: A | Discharge status: Home / Self Care | Payer: PPO | Attending: Emergency Medicine | Admitting: Emergency Medicine

## 2023-07-01 ENCOUNTER — Encounter (HOSPITAL_BASED_OUTPATIENT_CLINIC_OR_DEPARTMENT_OTHER): Payer: Self-pay | Admitting: Emergency Medicine

## 2023-07-01 DIAGNOSIS — W010XXA Fall on same level from slipping, tripping and stumbling without subsequent striking against object, initial encounter: Secondary | ICD-10-CM | POA: Diagnosis not present

## 2023-07-01 DIAGNOSIS — M25561 Pain in right knee: Secondary | ICD-10-CM | POA: Insufficient documentation

## 2023-07-01 DIAGNOSIS — M25461 Effusion, right knee: Secondary | ICD-10-CM | POA: Diagnosis not present

## 2023-07-01 DIAGNOSIS — M85861 Other specified disorders of bone density and structure, right lower leg: Secondary | ICD-10-CM | POA: Diagnosis not present

## 2023-07-01 DIAGNOSIS — M1711 Unilateral primary osteoarthritis, right knee: Secondary | ICD-10-CM | POA: Diagnosis not present

## 2023-07-01 DIAGNOSIS — W19XXXA Unspecified fall, initial encounter: Secondary | ICD-10-CM

## 2023-07-01 DIAGNOSIS — S80911A Unspecified superficial injury of right knee, initial encounter: Secondary | ICD-10-CM | POA: Diagnosis not present

## 2023-07-01 NOTE — ED Triage Notes (Signed)
Pt fell today ,landing on her right knee. Initially was not very painful,but now is very sore, hard to put weight on leg.

## 2023-07-01 NOTE — ED Notes (Signed)
Dc instructions reviewed with patient. Patient voiced understanding. Dc with belongings.  °

## 2023-07-01 NOTE — ED Notes (Signed)
Ambulated patient with rolling walker. Pt able to bear weight and ambulate. Pt states she does feel pain,but knee brace gives her support.

## 2023-07-01 NOTE — ED Provider Notes (Signed)
[place] EMERGENCY DEPARTMENT AT [facility] Provider Note   CSN: [id number] Arrival date & time: 07/01/23  1324     History  Chief Complaint  Patient presents with   Deborah Pruitt    Deborah Pruitt is a 82 y.o. female.  Patient with history of osteoporosis presents today with complaints of fall.  She states that same occurred earlier this morning when she was walking to get ready to go to church and tripped and fell on her right knee.  She did not hit her head or lose consciousness.  She is not anticoagulated.  She was able to get up off the ground and walk around afterwards and continued to go to church.  She states after the church service she tried to get up to go outside and had significant discomfort in her right knee and limited mobility.  She presents with concern for same.  She denies any other injuries or complaints.  Of note, she does note that she has had edema in her bilateral lower extremities and has been told to take Lasix for management of this.  She has not started taking it yet as she had some duties in the community that she needed to complete and was concerned about having to go to the bathroom all the time on this medication.  She denies any chest pain or shortness of breath.  The history is provided by the patient. No language interpreter was used.  Fall       Home Medications Prior to Admission medications   Medication Sig Start Date End Date Taking? Authorizing Provider  amLODipine (NORVASC) 10 MG tablet Take 1 tablet (10 mg total) by mouth daily. 08/16/22   Raliegh Ip, DO  atorvastatin (LIPITOR) 40 MG tablet Take 1 tablet (40 mg total) by mouth daily. 08/16/22   Raliegh Ip, DO  Calcium-Phosphorus-Vitamin D (CALCIUM GUMMIES PO) Take by mouth.    [provider]  Cholecalciferol (VITAMIN D3) 2000 UNITS TABS Take 1 tablet by mouth daily.    [provider]  Cyanocobalamin (VITAMIN B 12 PO) Take 1 tablet by mouth daily.     [provider]  furosemide (LASIX) 20 MG tablet Take 1 tablet (20 mg total) by mouth daily. 06/19/23   Daphine Deutscher Mary-Margaret, FNP  Multiple Vitamin (MULTIVITAMIN WITH MINERALS) TABS tablet Take 2 tablets by mouth daily.    [provider]  Multiple Vitamins-Minerals (AIRBORNE PO) Take by mouth.    [provider]  pantoprazole (PROTONIX) 40 MG tablet Take 1 tablet (40 mg total) by mouth daily. 08/16/22   Raliegh Ip, DO  RESTASIS 0.05 % ophthalmic emulsion  10/05/19   [provider]      Allergies    Milk-related compounds, Ciprofloxacin, Dexilant [dexlansoprazole], Penicillins, and Vioxx [rofecoxib]    Review of Systems   Review of Systems  Musculoskeletal:  Positive for arthralgias.  All other systems reviewed and are negative.   Physical Exam Updated Vital Signs BP (!) 162/85   Pulse (!) 104   Temp 98 F (36.7 C) (Oral)   Resp 20   Wt 70.3 kg   SpO2 97%   BMI 31.31 kg/m  Physical Exam Vitals and nursing note reviewed.  Constitutional:      General: She is not in acute distress.    Appearance: Normal appearance. She is normal weight. She is not ill-appearing, toxic-appearing or diaphoretic.  HENT:     Head: Normocephalic and atraumatic.  Cardiovascular:  Rate and Rhythm: Normal rate.  Pulmonary:     Effort: Pulmonary effort is normal. No respiratory distress.  Musculoskeletal:        General: Normal range of motion.     Cervical back: Normal range of motion.     Comments: TTP to the right anterior knee with bruising and swelling.  2+ pitting edema noted to bilateral lower extremities which patient states is unchanged from baseline.  No calf pain or tenderness.  No erythema or warmth.  DP and PT pulses intact and 2+.  ROM limited due to pain.  Skin:    General: Skin is warm and dry.  Neurological:     General: No focal deficit present.     Mental Status: She is alert.  Psychiatric:        Mood and Affect: Mood normal.         Behavior: Behavior normal.     ED Results / Procedures / Treatments   Labs (all labs ordered are listed, but only abnormal results are displayed) Labs Reviewed - No data to display  EKG None  Radiology CT Knee Right Wo Contrast  Result Date: 07/01/2023 CLINICAL DATA:  Larey Seat today and landed on right knee. Pain and swelling. EXAM: CT OF THE RIGHT KNEE WITHOUT CONTRAST TECHNIQUE: Multidetector CT imaging of the right knee was performed according to the standard protocol. Multiplanar CT image reconstructions were also generated. RADIATION DOSE REDUCTION: This exam was performed according to the departmental dose-optimization program which includes automated exposure control, adjustment of the mA and/or kV according to patient size and/or use of iterative reconstruction technique. COMPARISON:  Radiographs, same date. FINDINGS: Moderate age related tricompartmental degenerative changes with joint space narrowing and spurring. No acute fracture is identified. No osteochondral lesion. There is a small knee joint effusion. Grossly by CT the cruciate and collateral ligaments are intact. The quadriceps and patellar tendons are intact. There is a large hematoma in the prepatellar space likely related to direct trauma. Acute hemorrhagic prepatellar bursitis is also possible. This hematoma measures approximately 10 x 5 x 2.5 cm. IMPRESSION: 1. Moderate age related tricompartmental degenerative changes but no acute fracture or osteochondral lesion. 2. Small knee joint effusion. 3. Large prepatellar hematoma likely related to direct trauma. Acute traumatic hemorrhagic prepatellar bursitis is also possible. Electronically Signed   By: Rudie Meyer M.D.   On: 07/01/2023 11:09   DG Knee Complete 4 Views Right  Result Date: 07/01/2023 CLINICAL DATA:  Pain after fall EXAM: RIGHT KNEE - COMPLETE 4 VIEW COMPARISON:  None Available. FINDINGS: Severe osteopenia. No fracture or dislocation. Tiny osteophytes seen  of the patellofemoral joint and lateral compartment. Hyperostosis along the dorsal aspect of the patella. No joint effusion. Scattered vascular calcifications. IMPRESSION: Severe osteopenia.  Mild degenerative changes. Electronically Signed   By: Karen Kays M.D.   On: 07/01/2023 10:43    Procedures Procedures    Medications Ordered in ED Medications - No data to display  ED Course/ Medical Decision Making/ A&P                                 Medical Decision Making Amount and/or Complexity of Data Reviewed Radiology: ordered.   Patient presents today with complaints of right knee pain from a fall.  She is afebrile, nontoxic-appearing, in no acute distress reassuring vital signs.  Physical exam reveals TTP of the right anterior knee with associated bruising.  She  does have some pitting edema in her legs however is supposed to be on Lasix and not been taking it.  She denies any chest pain or shortness of breath therefore no additional evaluation for workup in this regard is indicated.  X-ray imaging of her knee revealed severe osteopenia and mild degenerative changes.  I have personally reviewed and interpreted this imaging and agree with radiology interpretation.  CT of her knee showed  1. Moderate age related tricompartmental degenerative changes but no acute fracture or osteochondral lesion. 2. Small knee joint effusion. 3. Large prepatellar hematoma likely related to direct trauma. Acute traumatic hemorrhagic prepatellar bursitis is also possible  I have personally reviewed and interpreted this imaging and agree with radiology.  Discussed findings with patient who expressed understanding is in agreement with this.  She has been given a knee brace for support and is able to walk with a walker which she has at home.  She already has an orthopedist that she follows with regularly.  Recommend that she call to schedule a follow-up appointment.  Her pain is controlled with Motrin she took prior  to arrival today.  Recommend RICE and Tylenol/Motrin as needed for pain. Evaluation and diagnostic testing in the emergency department does not suggest an emergent condition requiring admission or immediate intervention beyond what has been performed at this time.  Plan for discharge with close PCP follow-up.  Patient is understanding and amenable with plan, educated on red flag symptoms that would prompt immediate return.  Patient discharged in stable condition.  Final Clinical Impression(s) / ED Diagnoses Final diagnoses:  Fall, initial encounter  Acute pain of right knee    Rx / DC Orders ED Discharge Orders     None     An After Visit Summary was printed and given to the patient.     Vear Clock 07/01/23 1248    Derwood Kaplan, MD 07/01/23 1537

## 2023-07-01 NOTE — Discharge Instructions (Addendum)
As we discussed, your work-up in the ER was reassuring for acute findings.  X-ray and CT imaging of your knee did not reveal any emergent concerns.  I have given you a brace to wear for support of your knee and recommend that you use your walker to get around for the next few days.  I recommend that you call your orthopedist to schedule follow-up appointment as well.  In the interim, recommend that you rest, ice, compress, and elevate your leg and take Tylenol/Motrin as needed for pain.  Return if development of any new or worsening symptoms.

## 2023-07-01 NOTE — ED Notes (Signed)
Transport to cT

## 2023-07-05 ENCOUNTER — Ambulatory Visit (INDEPENDENT_AMBULATORY_CARE_PROVIDER_SITE_OTHER): Payer: PPO | Admitting: Family Medicine

## 2023-07-05 ENCOUNTER — Encounter: Payer: Self-pay | Admitting: Family Medicine

## 2023-07-05 VITALS — BP 130/71 | HR 96 | Temp 98.4°F | Ht 59.0 in | Wt 159.0 lb

## 2023-07-05 DIAGNOSIS — K2100 Gastro-esophageal reflux disease with esophagitis, without bleeding: Secondary | ICD-10-CM

## 2023-07-05 DIAGNOSIS — M810 Age-related osteoporosis without current pathological fracture: Secondary | ICD-10-CM | POA: Diagnosis not present

## 2023-07-05 DIAGNOSIS — I10 Essential (primary) hypertension: Secondary | ICD-10-CM

## 2023-07-05 DIAGNOSIS — E785 Hyperlipidemia, unspecified: Secondary | ICD-10-CM | POA: Diagnosis not present

## 2023-07-05 DIAGNOSIS — S8001XD Contusion of right knee, subsequent encounter: Secondary | ICD-10-CM | POA: Diagnosis not present

## 2023-07-05 MED ORDER — ATORVASTATIN CALCIUM 40 MG PO TABS
40.0000 mg | ORAL_TABLET | Freq: Every day | ORAL | 3 refills | Status: DC
Start: 2023-07-05 — End: 2024-07-07

## 2023-07-05 MED ORDER — PANTOPRAZOLE SODIUM 40 MG PO TBEC
40.0000 mg | DELAYED_RELEASE_TABLET | Freq: Every day | ORAL | 3 refills | Status: DC
Start: 2023-07-05 — End: 2024-07-07

## 2023-07-05 MED ORDER — AMLODIPINE BESYLATE 10 MG PO TABS
10.0000 mg | ORAL_TABLET | Freq: Every day | ORAL | 3 refills | Status: DC
Start: 2023-07-05 — End: 2024-05-12

## 2023-07-05 NOTE — Progress Notes (Signed)
Subjective: CC: ER follow-up PCP: Deborah Pruitt [id number] [name] is a 82 y.o. female presenting to clinic today for:  1.  ER follow-up Patient reports that she sustained a mechanical fall at home.  She did not fall directly onto her knee but more twisted to the side.  She was evaluated at the ER on [facility] who performed x-rays and CT of the right knee given the degree of swelling, ecchymosis.  She has been able to ambulate but notes some stiffness in that knee.  She has been using ice, knee brace and trying to elevate the knee.  She has noticed that the bruising seems to be going down the leg now.  Still has some tenderness anteriorly but overall it is getting slightly better.  2.  Hypertension with hyperlipidemia Compliant with medications.  No reports of chest pain, shortness of breath.  No dizziness.  Swelling is present and was started on Lasix recently but has not started it yet because she was afraid to given everything else was going on  3.  GERD This is chronic and stable.  Needs a renewal on Protonix.  No GI bleeding reported   ROS: Per HPI  Allergies  Allergen Reactions   Milk-Related Compounds Other (See Comments)    "I almost died"   Ciprofloxacin Hives and Swelling   Dexilant [Dexlansoprazole] Hives and Swelling   Penicillins Other (See Comments)    Childhood allergy.   Vioxx [Rofecoxib] Hives and Swelling   Past Medical History:  Diagnosis Date   Allergy baby   Arthritis    Asthma    Cataract 2015   Complication of anesthesia    Ileus post hip replacement   Femur fracture (HCC) 11/28/2017   GERD (gastroesophageal reflux disease)    Hip fracture, left (HCC) 11/28/2017   Hyperlipidemia    Hypertension    Urinary tract infection 2012   Verrucous skin lesion 10/17/2019    Current Outpatient Medications:    Calcium-Phosphorus-Vitamin D (CALCIUM GUMMIES PO), Take by mouth., Disp: , Rfl:    Cholecalciferol (VITAMIN D3) 2000 UNITS  TABS, Take 1 tablet by mouth daily., Disp: , Rfl:    Cyanocobalamin (VITAMIN B 12 PO), Take 1 tablet by mouth daily., Disp: , Rfl:    furosemide (LASIX) 20 MG tablet, Take 1 tablet (20 mg total) by mouth daily., Disp: 30 tablet, Rfl: 3   Multiple Vitamin (MULTIVITAMIN WITH MINERALS) TABS tablet, Take 2 tablets by mouth daily., Disp: , Rfl:    Multiple Vitamins-Minerals (AIRBORNE PO), Take by mouth., Disp: , Rfl:    RESTASIS 0.05 % ophthalmic emulsion, , Disp: , Rfl:    amLODipine (NORVASC) 10 MG tablet, Take 1 tablet (10 mg total) by mouth daily., Disp: 90 tablet, Rfl: 3   atorvastatin (LIPITOR) 40 MG tablet, Take 1 tablet (40 mg total) by mouth daily., Disp: 90 tablet, Rfl: 3   pantoprazole (PROTONIX) 40 MG tablet, Take 1 tablet (40 mg total) by mouth daily., Disp: 90 tablet, Rfl: 3 Social History   Socioeconomic History   Marital status: Married    Spouse name: Deborah Pruitt   Number of children: 2   Years of education: 14   Highest education level: Some college, no degree  Occupational History   Occupation: Retired    Comment: Airline pilot  Tobacco Use   Smoking status: Never   Smokeless tobacco: Never  Vaping Use   Vaping status: Never Used  Substance and Sexual Activity   Alcohol use: No  Drug use: No   Sexual activity: Yes    Birth control/protection: Post-menopausal  Other Topics Concern   Not on file  Social History Narrative   The patient is recently retired, 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      She is married with one son and one daughter. No alcohol caffeine or tobacco.      Children live hours away - daughter near the [facility], son opposite side of [place]      Very involved in church groups, travelling, volunteering, etc   Social Determinants of Health   Financial Resource Strain: Low Risk  (03/22/2023)   Overall Financial Resource Strain (CARDIA)    Difficulty of Paying Living Expenses: Not hard at all   Food Insecurity: No Food Insecurity (03/22/2023)   Hunger Vital Sign    Worried About Running Out of Food in the Last Year: Never true    Ran Out of Food in the Last Year: Never true  Transportation Needs: No Transportation Needs (03/22/2023)   PRAPARE - Administrator, Civil Service (Medical): No    Lack of Transportation (Non-Medical): No  Physical Activity: Insufficiently Active (03/22/2023)   Exercise Vital Sign    Days of Exercise per Week: 3 days    Minutes of Exercise per Session: 30 min  Stress: No Stress Concern Present (03/22/2023)   [employer] of Occupational Health - Occupational Stress Questionnaire    Feeling of Stress : Not at all  Social Connections: Socially Integrated (03/22/2023)   Social Connection and Isolation Panel [NHANES]    Frequency of Communication with Friends and Family: More than three times a week    Frequency of Social Gatherings with Friends and Family: More than three times a week    Attends Religious Services: More than 4 times per year    Active Member of [employer] or Organizations: Yes    Attends Engineer, structural: More than 4 times per year    Marital Status: Married  Catering manager Violence: Not At Risk (03/22/2023)   Humiliation, Afraid, Rape, and Kick questionnaire    Fear of Current or Ex-Partner: No    Emotionally Abused: No    Physically Abused: No    Sexually Abused: No   Family History  Problem Relation Age of Onset   Stroke Mother 8   Hypertension Mother    Heart disease Mother    Arthritis Mother    Asthma Mother    Heart disease Father    Diabetes Father    Cancer Father    Hearing loss Father    Stomach cancer Maternal Grandmother    Cancer Maternal Grandmother    Liver cancer Brother    Cancer Brother    Heart disease Brother    Lung cancer Brother    Colon cancer Neg Hx    Colon polyps Neg Hx    Esophageal cancer Neg Hx    Breast cancer Neg Hx     Objective: Office vital signs  reviewed. BP 130/71   Pulse 96   Temp 98.4 F (36.9 C)   Ht 4\' 11"  (1.499 m)   Wt 159 lb (72.1 kg)   SpO2 96%   BMI 32.11 kg/m   Physical Examination:  General: Awake, alert, well nourished, No acute distress HEENT: Sclera white.  Moist mucous membranes.  Does have some discoloration below bilateral eyes with back formation Cardio: regular rate and rhythm, S1S2 heard,  no murmurs appreciated Pulm: clear to auscultation bilaterally, no wheezes, rhonchi or rales; normal work of breathing on room air MSK: Antalgic gait.  Right knee with quite a bit of swelling, ecchymosis.  Tenderness palpation along the patellar tendon.  She is ambulating independently and utilizing a brace  Assessment/ Plan: 82 y.o. female   Essential hypertension - Plan: CMP14+EGFR, amLODipine (NORVASC) 10 MG tablet  Hyperlipidemia with target LDL less than 100 - Plan: [id number], Lipid panel, TSH, atorvastatin (LIPITOR) 40 MG tablet  Contusion of right knee, subsequent encounter - Plan: CBC  Gastroesophageal reflux disease with esophagitis without hemorrhage - Plan: pantoprazole (PROTONIX) 40 MG tablet  Age-related osteoporosis without current pathological fracture - Plan: CMP14+EGFR, VITAMIN D 25 Hydroxy (Vit-D Deficiency, Fractures)  Blood pressure is controlled.  No changes needed.  Renewal of medication sent.  Check labs  Continue statin.  Rx sent  Certainly seems like a contusion of that right knee.  She had some tenderness along the anterior patella.  I reviewed her CAT scan of the knee which showed some joint effusion and hematoma but no fracture.  We discussed home care instructions including ongoing compression, use of ice and elevation.  GERD is stable.  PPI renewed  Did not discuss osteoporosis.  Check calcium, vitamin D   Deborah Gebel Hulen Skains, Pruitt [facility] [phone]

## 2023-07-26 DIAGNOSIS — S8001XA Contusion of right knee, initial encounter: Secondary | ICD-10-CM | POA: Diagnosis not present

## 2023-07-26 DIAGNOSIS — M1711 Unilateral primary osteoarthritis, right knee: Secondary | ICD-10-CM | POA: Diagnosis not present

## 2023-07-27 ENCOUNTER — Other Ambulatory Visit: Payer: PPO

## 2023-07-27 DIAGNOSIS — E785 Hyperlipidemia, unspecified: Secondary | ICD-10-CM

## 2023-07-27 DIAGNOSIS — S8001XD Contusion of right knee, subsequent encounter: Secondary | ICD-10-CM

## 2023-07-27 DIAGNOSIS — I10 Essential (primary) hypertension: Secondary | ICD-10-CM | POA: Diagnosis not present

## 2023-07-27 DIAGNOSIS — R739 Hyperglycemia, unspecified: Secondary | ICD-10-CM | POA: Diagnosis not present

## 2023-07-27 DIAGNOSIS — M810 Age-related osteoporosis without current pathological fracture: Secondary | ICD-10-CM

## 2023-07-28 LAB — CMP14+EGFR
ALT: 18 [IU]/L (ref 0–32)
AST: 18 [IU]/L (ref 0–40)
Albumin: 4.6 g/dL (ref 3.8–4.8)
Alkaline Phosphatase: 96 [IU]/L (ref 44–121)
BUN/Creatinine Ratio: 16 (ref 12–28)
BUN: 14 mg/dL (ref 8–27)
Bilirubin Total: 0.8 mg/dL (ref 0.0–1.2)
CO2: 21 mmol/L (ref 20–29)
Calcium: 10.1 mg/dL (ref 8.7–10.3)
Chloride: 102 mmol/L (ref 96–106)
Creatinine, Ser: 0.87 mg/dL (ref 0.57–1.00)
Globulin, Total: 2.8 g/dL (ref 1.5–4.5)
Glucose: 172 mg/dL — ABNORMAL HIGH (ref 70–99)
Potassium: 4.4 mmol/L (ref 3.5–5.2)
Sodium: 141 mmol/L (ref 134–144)
Total Protein: 7.4 g/dL (ref 6.0–8.5)
eGFR: 67 mL/min/{1.73_m2} (ref >59)

## 2023-07-28 LAB — CBC
Hematocrit: 42.5 % (ref 34.0–46.6)
Hemoglobin: 14.2 g/dL (ref 11.1–15.9)
MCH: 32.3 pg (ref 26.6–33.0)
MCHC: 33.4 g/dL (ref 31.5–35.7)
MCV: 97 fL (ref 79–97)
Platelets: 310 10*3/uL (ref 150–450)
RBC: 4.4 x10E6/uL (ref 3.77–5.28)
RDW: 12.3 % (ref 11.7–15.4)
WBC: 9.4 10*3/uL (ref 3.4–10.8)

## 2023-07-28 LAB — LIPID PANEL
Chol/HDL Ratio: 3 ratio (ref 0.0–4.4)
Cholesterol, Total: 138 mg/dL (ref 100–199)
HDL: 46 mg/dL (ref >39)
LDL Chol Calc (NIH): 76 mg/dL (ref 0–99)
Triglycerides: 85 mg/dL (ref 0–149)
VLDL Cholesterol Cal: 16 mg/dL (ref 5–40)

## 2023-07-28 LAB — VITAMIN D 25 HYDROXY (VIT D DEFICIENCY, FRACTURES): Vit D, 25-Hydroxy: 71 ng/mL (ref 30.0–100.0)

## 2023-07-28 LAB — TSH: TSH: 0.359 u[IU]/mL — ABNORMAL LOW (ref 0.450–4.500)

## 2023-07-31 ENCOUNTER — Telehealth: Payer: Self-pay

## 2023-07-31 ENCOUNTER — Other Ambulatory Visit: Payer: Self-pay

## 2023-07-31 DIAGNOSIS — E785 Hyperlipidemia, unspecified: Secondary | ICD-10-CM

## 2023-07-31 NOTE — Telephone Encounter (Signed)
Transition Care Management Unsuccessful Follow-up Telephone Call  Date of discharge and from where:  07/01/2023 [facility]  Attempts:  1st Attempt  Reason for unsuccessful TCM follow-up call:  Left voice message  Mistina Coatney [facility]  [facility], [facility] Guide Direct Dial: [phone]  Website: [email or web address]

## 2023-08-01 ENCOUNTER — Telehealth: Payer: Self-pay

## 2023-08-01 LAB — SPECIMEN STATUS REPORT

## 2023-08-01 LAB — HGB A1C W/O EAG: Hgb A1c MFr Bld: 5.8 % — ABNORMAL HIGH (ref 4.8–5.6)

## 2023-08-01 NOTE — Telephone Encounter (Signed)
Transition Care Management Unsuccessful Follow-up Telephone Call  Date of discharge and from where:  07/01/2023 [facility]  Attempts:  2nd Attempt  Reason for unsuccessful TCM follow-up call:  Left voice message  Lesslie Mckeehan [facility]  [facility], [facility] Guide Direct Dial: [phone]  Website: [email or web address]

## 2023-08-28 ENCOUNTER — Other Ambulatory Visit: Payer: PPO

## 2023-08-28 DIAGNOSIS — E785 Hyperlipidemia, unspecified: Secondary | ICD-10-CM

## 2023-08-29 LAB — T4, FREE: Free T4: 1.49 ng/dL (ref 0.82–1.77)

## 2023-09-15 ENCOUNTER — Other Ambulatory Visit: Payer: Self-pay | Admitting: Nurse Practitioner

## 2023-09-15 DIAGNOSIS — R600 Localized edema: Secondary | ICD-10-CM

## 2023-10-16 DIAGNOSIS — H35341 Macular cyst, hole, or pseudohole, right eye: Secondary | ICD-10-CM | POA: Diagnosis not present

## 2023-10-16 DIAGNOSIS — H40013 Open angle with borderline findings, low risk, bilateral: Secondary | ICD-10-CM | POA: Diagnosis not present

## 2023-10-16 DIAGNOSIS — Z961 Presence of intraocular lens: Secondary | ICD-10-CM | POA: Diagnosis not present

## 2024-03-25 ENCOUNTER — Ambulatory Visit: Payer: PPO

## 2024-03-25 VITALS — BP 130/77 | HR 96 | Ht 59.0 in | Wt 159.0 lb

## 2024-03-25 DIAGNOSIS — Z1231 Encounter for screening mammogram for malignant neoplasm of breast: Secondary | ICD-10-CM

## 2024-03-25 DIAGNOSIS — Z0000 Encounter for general adult medical examination without abnormal findings: Secondary | ICD-10-CM

## 2024-03-25 NOTE — Progress Notes (Signed)
 Subjective:   Deborah Pruitt is a 83 y.o. who presents for a Medicare Wellness preventive visit.  As a reminder, Annual Wellness Visits don't include a physical exam, and some assessments may be limited, especially if this visit is performed virtually. We may recommend an in-person follow-up visit with your provider if needed.  Visit Complete: Virtual I connected with  Odalys C Elbaum on 03/25/24 by a audio enabled telemedicine application and verified that I am speaking with the correct person using two identifiers.  Patient Location: Home  Provider Location: Home Office  I discussed the limitations of evaluation and management by telemedicine. The patient expressed understanding and agreed to proceed.  Vital Signs: Because this visit was a virtual/telehealth visit, some criteria may be missing or patient reported. Any vitals not documented were not able to be obtained and vitals that have been documented are patient reported.  VideoDeclined- This patient declined Librarian, academic. Therefore the visit was completed with audio only.  Persons Participating in Visit: Patient.  AWV Questionnaire: Yes. Patient Medicare AWV questionnaire was completed 03/22/2024 prior to this visit.        Objective:    There were no vitals filed for this visit. There is no height or weight on file to calculate BMI.     03/25/2024   11:23 AM 07/01/2023   10:08 AM 03/22/2023   12:10 PM 03/20/2022   12:14 PM 03/18/2021    9:20 AM 03/17/2020    8:44 AM 03/07/2019   10:46 AM  Advanced Directives  Does Patient Have a Medical Advance Directive? No No Yes Yes Yes Yes Yes  Type of Surveyor, minerals;Living will Healthcare Power of [place];Living will Healthcare Power of [place];Living will Healthcare Power of [place];Living will Healthcare Power of [place];Living will  Does patient want to make changes to medical advance directive?       No - Patient declined No - Patient declined   Copy of Healthcare Power of Attorney in Chart?   No - copy requested No - copy requested No - copy requested No - copy requested No - copy requested   Would patient like information on creating a medical advance directive?  No - Patient declined          Data saved with a previous flowsheet row definition    Current Medications (verified) Outpatient Encounter Medications as of 03/25/2024  Medication Sig   amLODipine  (NORVASC ) 10 MG tablet Take 1 tablet (10 mg total) by mouth daily.   atorvastatin  (LIPITOR) 40 MG tablet Take 1 tablet (40 mg total) by mouth daily.   Calcium -Phosphorus-Vitamin D  (CALCIUM  GUMMIES PO) Take by mouth.   Cholecalciferol  (VITAMIN D3) 2000 UNITS TABS Take 1 tablet by mouth daily.   Multiple Vitamin (MULTIVITAMIN WITH MINERALS) TABS tablet Take 2 tablets by mouth daily.   Multiple Vitamins-Minerals (AIRBORNE PO) Take by mouth.   pantoprazole  (PROTONIX ) 40 MG tablet Take 1 tablet (40 mg total) by mouth daily.   RESTASIS  0.05 % ophthalmic emulsion    Cyanocobalamin (VITAMIN B 12 PO) Take 1 tablet by mouth daily. (Patient not taking: Reported on 03/25/2024)   [DISCONTINUED] furosemide  (LASIX ) 20 MG tablet Take 1 tablet (20 mg total) by mouth daily.   No facility-administered encounter medications on file as of 03/25/2024.    Allergies (verified) Milk-related compounds, Ciprofloxacin, Dexilant [dexlansoprazole], Penicillins, and Vioxx [rofecoxib]   History: Past Medical History:  Diagnosis Date   Allergy baby   Arthritis  Asthma    Cataract 2015   Complication of anesthesia    Ileus post hip replacement   Femur fracture (HCC) 11/28/2017   GERD (gastroesophageal reflux disease)    Hip fracture, left (HCC) 11/28/2017   Hyperlipidemia    Hypertension    Urinary tract infection 2012   Verrucous skin lesion 10/17/2019   Past Surgical History:  Procedure Laterality Date   CATARACT EXTRACTION W/PHACO Right 07/23/2014    Procedure: CATARACT EXTRACTION PHACO AND INTRAOCULAR LENS PLACEMENT RIGHT EYE CDE=6.24;  Surgeon: Cherene Mania, MD;  Location: [facility];  Service: Ophthalmology;  Laterality: Right;   CATARACT EXTRACTION W/PHACO Left 08/17/2014   Procedure: CATARACT EXTRACTION PHACO AND INTRAOCULAR LENS PLACEMENT LEFT EYE;  Surgeon: Cherene Mania, MD;  Location: [facility];  Service: Ophthalmology;  Laterality: Left;  CDE:4.65   CHOLECYSTECTOMY     EYE SURGERY  2015, 2019   cataract, eyelids to help vision   JOINT REPLACEMENT Left    hip   TOTAL HIP ARTHROPLASTY Left 1999   Family History  Problem Relation Age of Onset   Stroke Mother 19   Hypertension Mother    Heart disease Mother    Arthritis Mother    Asthma Mother    Heart disease Father    Diabetes Father    Cancer Father    Hearing loss Father    Stomach cancer Maternal Grandmother    Cancer Maternal Grandmother    Liver cancer Brother    Cancer Brother    Heart disease Brother    Lung cancer Brother    Colon cancer Neg Hx    Colon polyps Neg Hx    Esophageal cancer Neg Hx    Breast cancer Neg Hx    Social History   Socioeconomic History   Marital status: Married    Spouse name: Doug   Number of children: 2   Years of education: 14   Highest education level: Some college, no degree  Occupational History   Occupation: Retired    Comment: Airline pilot  Tobacco Use   Smoking status: Never   Smokeless tobacco: Never  Vaping Use   Vaping status: Never Used  Substance and Sexual Activity   Alcohol use: No   Drug use: Never   Sexual activity: Yes    Birth control/protection: Post-menopausal  Other Topics Concern   Not on file  Social History Narrative   The patient is recently retired, 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      She is married with one son and one daughter. No alcohol caffeine or tobacco.      Children live hours away - daughter near the [facility], son  opposite side of [place]      Very involved in church groups, travelling, volunteering, etc   Social Drivers of Health   Financial Resource Strain: Low Risk  (03/25/2024)   Overall Financial Resource Strain (CARDIA)    Difficulty of Paying Living Expenses: Not hard at all  Food Insecurity: No Food Insecurity (03/25/2024)   Hunger Vital Sign    Worried About Running Out of Food in the Last Year: Never true    Ran Out of Food in the Last Year: Never true  Transportation Needs: No Transportation Needs (03/25/2024)   PRAPARE - Administrator, Civil Service (Medical): No    Lack of Transportation (Non-Medical): No  Physical Activity: Insufficiently Active (03/25/2024)   Exercise Vital Sign  Days of Exercise per Week: 3 days    Minutes of Exercise per Session: 30 min  Stress: No Stress Concern Present (03/25/2024)   [employer] of Occupational Health - Occupational Stress Questionnaire    Feeling of Stress: Not at all  Social Connections: Socially Integrated (03/25/2024)   Social Connection and Isolation Panel    Frequency of Communication with Friends and Family: More than three times a week    Frequency of Social Gatherings with Friends and Family: More than three times a week    Attends Religious Services: More than 4 times per year    Active Member of [employer] or Organizations: Yes    Attends Engineer, structural: More than 4 times per year    Marital Status: Married    Tobacco Counseling Counseling given: Not Answered    Clinical Intake:  Pre-visit preparation completed: Yes  Pain : No/denies pain     Nutritional Risks: None Diabetes: No  Lab Results  Component Value Date   HGBA1C 5.8 (H) 07/27/2023   HGBA1C 5.5 02/01/2018   HGBA1C 5.5 01/28/2013     How often do you need to have someone help you when you read instructions, pamphlets, or other written materials from your doctor or pharmacy?: 1 - Never  Interpreter Needed?: No  Information entered by  :: alia t/cma   Activities of Daily Living     03/22/2024    2:47 PM  In your present state of health, do you have any difficulty performing the following activities:  Hearing? 0  Vision? 0  Difficulty concentrating or making decisions? 0  Walking or climbing stairs? 1  Dressing or bathing? 0  Doing errands, shopping? 0  Preparing Food and eating ? N  Using the Toilet? N  In the past six months, have you accidently leaked urine? N  Do you have problems with loss of bowel control? N  Managing your Medications? N  Managing your Finances? N  Housekeeping or managing your Housekeeping? N    Patient Care Team: Jolinda Norene HERO, DO as PCP - General (Family Medicine)  I have updated your Care Teams any recent Medical Services you may have received from other providers in the past year.     Assessment:   This is a routine wellness examination for Lacinda.  Hearing/Vision screen Hearing Screening - Comments:: Pt denies hearing dif Vision Screening - Comments:: Pt wears glasses/pt goes to [facility] [address] In [place], [place] &amp; Dr. Octavia in [place], [place]/last ov 2025   Goals Addressed             This Visit's Progress    DIET - INCREASE WATER INTAKE   On track    Try to drink 6-8 glasses of water daily.     Exercise 150 min/wk Moderate Activity   On track    Walking is a great option.       Depression Screen     03/25/2024   10:51 AM 06/19/2023    2:07 PM 03/22/2023   12:09 PM 08/16/2022   11:14 AM 03/20/2022   12:07 PM 05/13/2021    1:29 PM 03/18/2021    9:02 AM  PHQ 2/9 Scores  PHQ - 2 Score 0 0 0 0 0 0 0  PHQ- 9 Score  0    1 3    Fall Risk     03/22/2024    2:47 PM 07/05/2023    1:59 PM 06/19/2023    2:07 PM 03/22/2023  12:07 PM 03/19/2023    5:28 PM  Fall Risk   Falls in the past year? 1 0 0 0 0  Number falls in past yr: 0 0  0   Injury with Fall? 1 0  0   Risk for fall due to :  No Fall Risks  No Fall Risks   Follow up  Education provided  Falls prevention  discussed     MEDICARE RISK AT HOME:  Medicare Risk at Home Any stairs in or around the home?: (Patient-Rptd) Yes If so, are there any without handrails?: (Patient-Rptd) No Home free of loose throw rugs in walkways, pet beds, electrical cords, etc?: (Patient-Rptd) Yes Adequate lighting in your home to reduce risk of falls?: (Patient-Rptd) Yes Life alert?: (Patient-Rptd) No Use of a cane, walker or w/c?: (Patient-Rptd) No Grab bars in the bathroom?: (Patient-Rptd) Yes Shower chair or bench in shower?: (Patient-Rptd) No Elevated toilet seat or a handicapped toilet?: (Patient-Rptd) Yes  TIMED UP AND GO:  Was the test performed?  no  Cognitive Function: 6CIT completed    02/05/2018    2:54 PM  MMSE - Mini Mental State Exam  Orientation to time 5  Orientation to Place 5  Registration 3  Attention/ Calculation 5  Recall 3  Language- name 2 objects 2  Language- repeat 1  Language- follow 3 step command 3  Language- read & follow direction 1  Write a sentence 1  Copy design 1  Total score 30        03/25/2024   11:19 AM 03/22/2023   12:11 PM 03/20/2022   12:14 PM 03/17/2020    8:47 AM 03/07/2019   10:48 AM  6CIT Screen  What Year? 0 points 0 points 0 points 0 points 0 points  What month? 0 points 0 points 0 points 0 points 0 points  What time? 0 points 0 points 0 points 0 points 0 points  Count back from 20 0 points 0 points 0 points 0 points 0 points  Months in reverse 0 points 0 points 0 points 0 points 0 points  Repeat phrase 0 points 0 points 0 points 0 points 0 points  Total Score 0 points 0 points 0 points 0 points 0 points    Immunizations Immunization History  Administered Date(s) Administered   Fluad Quad(high Dose 65+) 06/25/2019, 08/16/2022   Fluad Trivalent(High Dose 65+) 06/20/2023   Influenza, High Dose Seasonal PF 08/23/2017, 08/02/2018   Influenza-Unspecified 08/23/2017, 08/02/2018   Moderna Sars-Covid-2 Vaccination 11/19/2019, 12/17/2019   Pneumococcal  Conjugate-13 05/12/2016   Pneumococcal Polysaccharide-23 06/25/2019   Tdap 08/19/2013    Screening Tests Health Maintenance  Topic Date Due   Zoster Vaccines- Shingrix (1 of 2) Never done   DTaP/Tdap/Td (2 - Td or Tdap) 08/20/2023   MAMMOGRAM  10/03/2023   COVID-19 Vaccine (3 - 2024-25 season) 04/10/2025 (Originally 05/27/2023)   INFLUENZA VACCINE  04/25/2024   Medicare Annual Wellness (AWV)  03/25/2025   Pneumococcal Vaccine: 50+ Years  Completed   DEXA SCAN  Completed   Hepatitis B Vaccines  Aged Out   HPV VACCINES  Aged Out   Meningococcal B Vaccine  Aged Out   Colonoscopy  Discontinued    Health Maintenance  Health Maintenance Due  Topic Date Due   Zoster Vaccines- Shingrix (1 of 2) Never done   DTaP/Tdap/Td (2 - Td or Tdap) 08/20/2023   MAMMOGRAM  10/03/2023   Health Maintenance Items Addressed: See Nurse Notes at the end of this note  Additional Screening:  Vision Screening: Recommended annual ophthalmology exams for early detection of glaucoma and other disorders of the eye. Would you like a referral to an eye doctor? No    Dental Screening: Recommended annual dental exams for proper oral hygiene  Community Resource Referral / Chronic Care Management: CRR required this visit?  No   CCM required this visit?  No   Plan:    I have personally reviewed and noted the following in the patient's chart:   Medical and social history Use of alcohol, tobacco or illicit drugs  Current medications and supplements including opioid prescriptions. Patient is not currently taking opioid prescriptions. Functional ability and status Nutritional status Physical activity Advanced directives List of other physicians Hospitalizations, surgeries, and ER visits in previous 12 months Vitals Screenings to include cognitive, depression, and falls Referrals and appointments  In addition, I have reviewed and discussed with patient certain preventive protocols, quality metrics,  and best practice recommendations. A written personalized care plan for preventive services as well as general preventive health recommendations were provided to patient.   Ozie Ned, CMA   03/25/2024   After Visit Summary: (MyChart) Due to this being a telephonic visit, the after visit summary with patients personalized plan was offered to patient via MyChart   Notes: Pt is aware and due DTAP and Shingles will get at next visit w/pcp

## 2024-03-25 NOTE — Patient Instructions (Addendum)
 Deborah Pruitt , Thank you for taking time out of your busy schedule to complete your Annual Wellness Visit with me. I enjoyed our conversation and look forward to speaking with you again next year. I, as well as your care team,  appreciate your ongoing commitment to your health goals. Please review the following plan we discussed and let me know if I can assist you in the future. Your Game plan/ To Do List    Follow up Visits: Next Medicare AWV with our clinical staff: 03/26/25 at 3:10p.m.    Next Office Visit with your provider: 07/07/24 at 9:00a.m.  Clinician Recommendations:  Aim for 30 minutes of exercise or brisk walking, 6-8 glasses of water, and 5 servings of fruits and vegetables each day. Pt is aware and due DTAP and Shingles.       This is a list of the screening recommended for you and due dates:  Health Maintenance  Topic Date Due   Zoster (Shingles) Vaccine (1 of 2) Never done   DTaP/Tdap/Td vaccine (2 - Td or Tdap) 08/20/2023   Mammogram  10/03/2023   Medicare Annual Wellness Visit  03/21/2024   COVID-19 Vaccine (3 - 2024-25 season) 04/10/2025*   Flu Shot  04/25/2024   Pneumococcal Vaccine for age over 68  Completed   DEXA scan (bone density measurement)  Completed   Hepatitis B Vaccine  Aged Out   HPV Vaccine  Aged Out   Meningitis B Vaccine  Aged Out   Colon Cancer Screening  Discontinued  *Topic was postponed. The date shown is not the original due date.    Advanced directives: Pt has no Medical advanced directives at this time.   [x]  Makes sure you receive the medical care that is consistent with your values, goals, and preferences  [x]  It provides guidance to your family and loved ones and reduces their decisional burden about whether or not they are making the right decisions based on your wishes.  Follow the link provided in your after visit summary or read over the paperwork we have mailed to you to help you started getting your Advance Directives in place. If  you need assistance in completing these, please reach out to us  so that we can help you!  See attachments for Preventive Care and Fall Prevention Tips.

## 2024-05-11 ENCOUNTER — Other Ambulatory Visit: Payer: Self-pay | Admitting: Family Medicine

## 2024-05-11 DIAGNOSIS — I10 Essential (primary) hypertension: Secondary | ICD-10-CM

## 2024-06-03 ENCOUNTER — Other Ambulatory Visit: Payer: Self-pay | Admitting: Family Medicine

## 2024-06-03 DIAGNOSIS — Z1231 Encounter for screening mammogram for malignant neoplasm of breast: Secondary | ICD-10-CM

## 2024-06-18 ENCOUNTER — Ambulatory Visit
Admission: RE | Admit: 2024-06-18 | Discharge: 2024-06-18 | Disposition: A | Discharge status: Home / Self Care | Source: Ambulatory Visit | Attending: Family Medicine | Admitting: Family Medicine

## 2024-06-18 DIAGNOSIS — Z1231 Encounter for screening mammogram for malignant neoplasm of breast: Secondary | ICD-10-CM

## 2024-07-04 ENCOUNTER — Other Ambulatory Visit: Payer: Self-pay | Admitting: Family Medicine

## 2024-07-04 DIAGNOSIS — Z13820 Encounter for screening for osteoporosis: Secondary | ICD-10-CM

## 2024-07-04 DIAGNOSIS — Z780 Asymptomatic menopausal state: Secondary | ICD-10-CM

## 2024-07-07 ENCOUNTER — Encounter: Payer: Self-pay | Admitting: Family Medicine

## 2024-07-07 ENCOUNTER — Ambulatory Visit: Payer: PPO | Admitting: Family Medicine

## 2024-07-07 ENCOUNTER — Ambulatory Visit (INDEPENDENT_AMBULATORY_CARE_PROVIDER_SITE_OTHER)

## 2024-07-07 VITALS — BP 135/75 | HR 97 | Temp 97.8°F | Ht 59.0 in | Wt 153.4 lb

## 2024-07-07 DIAGNOSIS — Z23 Encounter for immunization: Secondary | ICD-10-CM | POA: Diagnosis not present

## 2024-07-07 DIAGNOSIS — I10 Essential (primary) hypertension: Secondary | ICD-10-CM

## 2024-07-07 DIAGNOSIS — Z780 Asymptomatic menopausal state: Secondary | ICD-10-CM

## 2024-07-07 DIAGNOSIS — Z0001 Encounter for general adult medical examination with abnormal findings: Secondary | ICD-10-CM

## 2024-07-07 DIAGNOSIS — R7303 Prediabetes: Secondary | ICD-10-CM | POA: Diagnosis not present

## 2024-07-07 DIAGNOSIS — K2100 Gastro-esophageal reflux disease with esophagitis, without bleeding: Secondary | ICD-10-CM | POA: Diagnosis not present

## 2024-07-07 DIAGNOSIS — E785 Hyperlipidemia, unspecified: Secondary | ICD-10-CM | POA: Diagnosis not present

## 2024-07-07 DIAGNOSIS — Z0000 Encounter for general adult medical examination without abnormal findings: Secondary | ICD-10-CM

## 2024-07-07 DIAGNOSIS — Z13820 Encounter for screening for osteoporosis: Secondary | ICD-10-CM

## 2024-07-07 DIAGNOSIS — M810 Age-related osteoporosis without current pathological fracture: Secondary | ICD-10-CM | POA: Diagnosis not present

## 2024-07-07 LAB — BAYER DCA HB A1C WAIVED: HB A1C (BAYER DCA - WAIVED): 5.6 % (ref 4.8–5.6)

## 2024-07-07 MED ORDER — ATORVASTATIN CALCIUM 40 MG PO TABS: 40.0000 mg | ORAL_TABLET | Freq: Every day | ORAL | 3 refills | Status: AC

## 2024-07-07 MED ORDER — AMLODIPINE BESYLATE 10 MG PO TABS: 10.0000 mg | ORAL_TABLET | Freq: Every day | ORAL | 3 refills | Status: AC

## 2024-07-07 MED ORDER — PANTOPRAZOLE SODIUM 40 MG PO TBEC: 40.0000 mg | DELAYED_RELEASE_TABLET | Freq: Every day | ORAL | 3 refills | Status: AC

## 2024-07-07 NOTE — Progress Notes (Signed)
 Deborah Pruitt is a 83 y.o. female presents to office today for annual physical exam examination.    She reports that she is doing well.  She voices no concerns today.  Has some typical osteoarthritis but this is well relieved by utilization of topical Voltaren gel and/or capsaicin cream.  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  She is fasting today and compliant with all medications.  Reports no chest pain, shortness of breath, dizziness.  Mental health is good.  Vision is stable and she recently got updated eyeglasses   Occupation: retired, Marital status: married to Deborah Pruitt, Substance use: none Health Maintenance Due  Topic Date Due   DEXA SCAN  05/17/2023   Influenza Vaccine  04/25/2024    Immunization History  Administered Date(s) Administered   Fluad Quad(high Dose 65+) 06/25/2019, 08/16/2022   Fluad Trivalent(High Dose 65+) 06/20/2023   INFLUENZA, HIGH DOSE SEASONAL PF 08/23/2017, 08/02/2018   Influenza-Unspecified 08/23/2017, 08/02/2018   Moderna Sars-Covid-2 Vaccination 11/19/2019, 12/17/2019   Pneumococcal Conjugate-13 05/12/2016   Pneumococcal Polysaccharide-23 06/25/2019   Tdap 08/19/2013   Past Medical History:  Diagnosis Date   Allergy baby   Arthritis    Asthma    Cataract 2015   Complication of anesthesia    Ileus post hip replacement   Femur fracture (HCC) 11/28/2017   GERD (gastroesophageal reflux disease)    Hip fracture, left (HCC) 11/28/2017   Hyperlipidemia    Hypertension    Urinary tract infection 2012   Verrucous skin lesion 10/17/2019   Social History   Socioeconomic History   Marital status: Married    Spouse name: Deborah Pruitt   Number of children: 2   Years of education: 14   Highest education level: Some college, no degree  Occupational History   Occupation: Retired    Comment: Airline pilot  Tobacco Use   Smoking status: Never   Smokeless tobacco: Never  Vaping Use   Vaping  status: Never Used  Substance and Sexual Activity   Alcohol use: No   Drug use: Never   Sexual activity: Yes    Birth control/protection: Post-menopausal  Other Topics Concern   Not on file  Social History Narrative   The patient is recently retired, 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      She is married with one son and one daughter. No alcohol caffeine or tobacco.      Children live hours away - daughter near the [facility], son opposite side of       Very involved in church groups, travelling, volunteering, etc   Social Drivers of Health   Financial Resource Strain: Low Risk  (07/04/2024)   Overall Financial Resource Strain (CARDIA)    Difficulty of Paying Living Expenses: Not hard at all  Food Insecurity: No Food Insecurity (07/04/2024)   Hunger Vital Sign    Worried About Running Out of Food in the Last Year: Never true    Ran Out of Food in the Last Year: Never true  Transportation Needs: No Transportation Needs (07/04/2024)   PRAPARE - Administrator, Civil Service (Medical): No    Lack of Transportation (Non-Medical): No  Physical Activity: Insufficiently Active (07/04/2024)   Exercise Vital Sign    Days of Exercise per Week: 4 days    Minutes of Exercise per Session: 20 min  Stress: No Stress Concern Present (07/04/2024)  [employer] of Occupational Health - Occupational Stress Questionnaire    Feeling of Stress: Only a little  Social Connections: Socially Integrated (07/04/2024)   Social Connection and Isolation Panel    Frequency of Communication with Friends and Family: More than three times a week    Frequency of Social Gatherings with Friends and Family: Once a week    Attends Religious Services: More than 4 times per year    Active Member of [employer] or Organizations: Yes    Attends Banker Meetings: More than 4 times per year    Marital Status: Married  Careers information officer Violence: Not At Risk (03/25/2024)   Humiliation, Afraid, Rape, and Kick questionnaire    Fear of Current or Ex-Partner: No    Emotionally Abused: No    Physically Abused: No    Sexually Abused: No   Past Surgical History:  Procedure Laterality Date   CATARACT EXTRACTION W/PHACO Right 07/23/2014   Procedure: CATARACT EXTRACTION PHACO AND INTRAOCULAR LENS PLACEMENT RIGHT EYE CDE=6.24;  Surgeon: Cherene Mania, MD;  Location: [facility];  Service: Ophthalmology;  Laterality: Right;   CATARACT EXTRACTION W/PHACO Left 08/17/2014   Procedure: CATARACT EXTRACTION PHACO AND INTRAOCULAR LENS PLACEMENT LEFT EYE;  Surgeon: Cherene Mania, MD;  Location: [facility];  Service: Ophthalmology;  Laterality: Left;  CDE:4.65   CHOLECYSTECTOMY     EYE SURGERY  2015, 2019   cataract, eyelids to help vision   JOINT REPLACEMENT Left    hip   TOTAL HIP ARTHROPLASTY Left 1999   Family History  Problem Relation Age of Onset   Stroke Mother 10   Hypertension Mother    Heart disease Mother    Arthritis Mother    Asthma Mother    Heart disease Father    Diabetes Father    Cancer Father    Hearing loss Father    Stomach cancer Maternal Grandmother    Cancer Maternal Grandmother    Liver cancer Brother    Cancer Brother    Heart disease Brother    Lung cancer Brother    Colon cancer Neg Hx    Colon polyps Neg Hx    Esophageal cancer Neg Hx    Breast cancer Neg Hx     Current Outpatient Medications:    amLODipine  (NORVASC ) 10 MG tablet, Take 1 tablet (10 mg total) by mouth daily., Disp: 100 tablet, Rfl: 3   atorvastatin  (LIPITOR) 40 MG tablet, Take 1 tablet (40 mg total) by mouth daily., Disp: 100 tablet, Rfl: 3   Calcium -Phosphorus-Vitamin D  (CALCIUM  GUMMIES PO), Take by mouth., Disp: , Rfl:    Cholecalciferol  (VITAMIN D3) 2000 UNITS TABS, Take 1 tablet by mouth daily., Disp: , Rfl:    Cyanocobalamin (VITAMIN B 12 PO), Take 1 tablet by mouth daily., Disp: , Rfl:    Multiple Vitamin (MULTIVITAMIN WITH  MINERALS) TABS tablet, Take 2 tablets by mouth daily., Disp: , Rfl:    Multiple Vitamins-Minerals (AIRBORNE PO), Take by mouth., Disp: , Rfl:    pantoprazole  (PROTONIX ) 40 MG tablet, Take 1 tablet (40 mg total) by mouth daily., Disp: 100 tablet, Rfl: 3   RESTASIS  0.05 % ophthalmic emulsion, , Disp: , Rfl:   Allergies  Allergen Reactions   Milk-Related Compounds Other (See Comments)    I almost died   Ciprofloxacin Hives and Swelling   Dexilant [Dexlansoprazole] Hives and Swelling   Penicillins Other (See Comments)    Childhood allergy.   Vioxx [Rofecoxib] Hives and Swelling  ROS: Review of Systems Pertinent items noted in HPI and remainder of comprehensive ROS otherwise negative.    Physical exam BP 135/75   Pulse 97   Temp 97.8 F (36.6 C)   Ht 4' 11 (1.499 m)   Wt 153 lb 6.4 oz (69.6 kg)   SpO2 97%   BMI 30.98 kg/m  General appearance: alert, cooperative, appears stated age, no distress, and mildly obese Head: Normocephalic, without obvious abnormality, atraumatic Eyes: negative findings: lids and lashes normal, conjunctivae and sclerae normal, corneas clear, and pupils equal, round, reactive to light and accomodation Ears: normal TM's and external ear canals both ears Nose: Nares normal. Septum midline. Mucosa normal. No drainage or sinus tenderness. Throat: lips, mucosa, and tongue normal; teeth and gums normal Neck: no adenopathy, no carotid bruit, supple, symmetrical, trachea midline, and thyroid  not enlarged, symmetric, no tenderness/mass/nodules Back: Increased thoracic kyphosis with mild reduction in extension of the thoracic spine. Lungs: clear to auscultation bilaterally Heart: regular rate and rhythm, S1, S2 normal, no murmur, click, rub or gallop Abdomen: soft, non-tender; bowel sounds normal; no masses,  no organomegaly Extremities: Nonpitting edema, trace.  She has varicose veins at the ankles. Pulses: 2+ and symmetric Skin: Skin color, texture, turgor  normal. No rashes or lesions Lymph nodes: Cervical, supraclavicular, and axillary nodes normal. Neurologic: Grossly normal      07/07/2024    8:46 AM 03/25/2024   10:51 AM 06/19/2023    2:07 PM  Depression screen PHQ 2/9  Decreased Interest 0 0 0  Down, Depressed, Hopeless 0 0 0  PHQ - 2 Score 0 0 0  Altered sleeping   0  Tired, decreased energy   0  Change in appetite   0  Feeling bad or failure about yourself    0  Trouble concentrating   0  Moving slowly or fidgety/restless   0  Suicidal thoughts   0  PHQ-9 Score   0  Difficult doing work/chores   Not difficult at all      06/19/2023    2:08 PM 08/16/2022   11:14 AM 05/13/2021    1:29 PM  GAD 7 : Generalized Anxiety Score  Nervous, Anxious, on Edge 0 0 0  Control/stop worrying 0 0 0  Worry too much - different things 0 0 0  Trouble relaxing 0 0 1  Restless 0 0 0  Easily annoyed or irritable 0 0 0  Afraid - awful might happen 0 0 0  Total GAD 7 Score 0 0 1  Anxiety Difficulty Not difficult at all Not difficult at all Not difficult at all    No results found for this or any previous visit (from the past 2160 hours).   Assessment/ Plan: Deborah Pruitt here for annual physical exam.   Annual physical exam  Essential hypertension - Plan: CMP14+EGFR, amLODipine  (NORVASC ) 10 MG tablet  Hyperlipidemia with target LDL less than 100 - Plan: RFE85+ZHQM, Lipid Panel, TSH, atorvastatin  (LIPITOR) 40 MG tablet  Age-related osteoporosis without current pathological fracture - Plan: CMP14+EGFR, VITAMIN D  25 Hydroxy (Vit-D Deficiency, Fractures)  Gastroesophageal reflux disease with esophagitis without hemorrhage - Plan: CMP14+EGFR, CBC with Differential, pantoprazole  (PROTONIX ) 40 MG tablet  Prediabetes - Plan: CMP14+EGFR, Bayer DCA Hb A1c Waived   Fasting labs collected.  She was given influenza vaccination but wants to hold off on all other vaccines today.  DEXA scan performed.  Continue vitamin D , calcium  and  weightbearing exercise.  GERD stable.  PPI renewed.  Continue  Norvasc  and statin.  Counseled on healthy lifestyle choices, including diet (rich in fruits, vegetables and lean meats and low in salt and simple carbohydrates) and exercise (at least 30 minutes of moderate physical activity daily).  Patient to follow up 1 year for CPE  Deborah Pruitt M. Jolinda, DO

## 2024-07-07 NOTE — Addendum Note (Signed)
 Addended by: JODENE CORNERS B on: 07/07/2024 09:20 AM   Modules accepted: Orders

## 2024-07-08 ENCOUNTER — Ambulatory Visit: Payer: Self-pay | Admitting: Family Medicine

## 2024-07-08 LAB — CMP14+EGFR
ALT: 17 IU/L (ref 0–32)
AST: 21 IU/L (ref 0–40)
Albumin: 4.6 g/dL (ref 3.7–4.7)
Alkaline Phosphatase: 87 IU/L (ref 48–129)
BUN/Creatinine Ratio: 14 (ref 12–28)
BUN: 12 mg/dL (ref 8–27)
Bilirubin Total: 0.9 mg/dL (ref 0.0–1.2)
CO2: 23 mmol/L (ref 20–29)
Calcium: 9.6 mg/dL (ref 8.7–10.3)
Chloride: 104 mmol/L (ref 96–106)
Creatinine, Ser: 0.85 mg/dL (ref 0.57–1.00)
Globulin, Total: 2.2 g/dL (ref 1.5–4.5)
Glucose: 116 mg/dL — ABNORMAL HIGH (ref 70–99)
Potassium: 3.7 mmol/L (ref 3.5–5.2)
Sodium: 142 mmol/L (ref 134–144)
Total Protein: 6.8 g/dL (ref 6.0–8.5)
eGFR: 69 mL/min/1.73 (ref >59)

## 2024-07-08 LAB — CBC WITH DIFFERENTIAL/PLATELET
Basophils Absolute: 0 x10E3/uL (ref 0.0–0.2)
Basos: 1 %
EOS (ABSOLUTE): 0.1 x10E3/uL (ref 0.0–0.4)
Eos: 3 %
Hematocrit: 39.8 % (ref 34.0–46.6)
Hemoglobin: 13.6 g/dL (ref 11.1–15.9)
Immature Grans (Abs): 0 x10E3/uL (ref 0.0–0.1)
Immature Granulocytes: 0 %
Lymphocytes Absolute: 1.3 x10E3/uL (ref 0.7–3.1)
Lymphs: 33 %
MCH: 32.6 pg (ref 26.6–33.0)
MCHC: 34.2 g/dL (ref 31.5–35.7)
MCV: 95 fL (ref 79–97)
Monocytes Absolute: 0.3 x10E3/uL (ref 0.1–0.9)
Monocytes: 8 %
Neutrophils Absolute: 2.2 x10E3/uL (ref 1.4–7.0)
Neutrophils: 55 %
Platelets: 239 x10E3/uL (ref 150–450)
RBC: 4.17 x10E6/uL (ref 3.77–5.28)
RDW: 13.1 % (ref 11.7–15.4)
WBC: 4 x10E3/uL (ref 3.4–10.8)

## 2024-07-08 LAB — VITAMIN D 25 HYDROXY (VIT D DEFICIENCY, FRACTURES): Vit D, 25-Hydroxy: 66.5 ng/mL (ref 30.0–100.0)

## 2024-07-08 LAB — LIPID PANEL
Chol/HDL Ratio: 3.9 ratio (ref 0.0–4.4)
Cholesterol, Total: 128 mg/dL (ref 100–199)
HDL: 33 mg/dL — ABNORMAL LOW (ref >39)
LDL Chol Calc (NIH): 61 mg/dL (ref 0–99)
Triglycerides: 209 mg/dL — ABNORMAL HIGH (ref 0–149)
VLDL Cholesterol Cal: 34 mg/dL (ref 5–40)

## 2024-07-08 LAB — TSH: TSH: 0.878 u[IU]/mL (ref 0.450–4.500)

## 2024-07-08 NOTE — Telephone Encounter (Signed)
 Patient aware and verbalized understanding.

## 2024-10-08 ENCOUNTER — Encounter: Payer: Self-pay | Admitting: Physical Therapy

## 2024-10-08 ENCOUNTER — Other Ambulatory Visit: Payer: Self-pay

## 2024-10-08 ENCOUNTER — Ambulatory Visit: Attending: Physician Assistant | Admitting: Physical Therapy

## 2024-10-08 DIAGNOSIS — M5459 Other low back pain: Secondary | ICD-10-CM | POA: Diagnosis present

## 2024-10-08 DIAGNOSIS — M62830 Muscle spasm of back: Secondary | ICD-10-CM | POA: Insufficient documentation

## 2024-10-08 DIAGNOSIS — R293 Abnormal posture: Secondary | ICD-10-CM | POA: Insufficient documentation

## 2024-10-08 NOTE — Therapy (Signed)
 " OUTPATIENT PHYSICAL THERAPY THORACOLUMBAR EVALUATION   Patient Name: Deborah Pruitt MRN: [id number] DOB:December 02, 1940, 84 y.o., female Today's Date: 10/08/2024  END OF SESSION:  PT End of Session - 10/08/24 1102     Visit Number 1    Number of Visits 12    Date for Recertification  01/06/25    PT Start Time 0845    PT Stop Time 0944    PT Time Calculation (min) 59 min    Activity Tolerance Patient tolerated treatment well    Behavior During Therapy [facility] for tasks assessed/performed          Past Medical History:  Diagnosis Date   Allergy baby   Arthritis    Asthma    Cataract 2015   Complication of anesthesia    Ileus post hip replacement   Femur fracture (HCC) 11/28/2017   GERD (gastroesophageal reflux disease)    Hip fracture, left (HCC) 11/28/2017   Hyperlipidemia    Hypertension    Urinary tract infection 2012   Verrucous skin lesion 10/17/2019   Past Surgical History:  Procedure Laterality Date   CATARACT EXTRACTION W/PHACO Right 07/23/2014   Procedure: CATARACT EXTRACTION PHACO AND INTRAOCULAR LENS PLACEMENT RIGHT EYE CDE=6.24;  Surgeon: Cherene Mania, MD;  Location: [facility];  Service: Ophthalmology;  Laterality: Right;   CATARACT EXTRACTION W/PHACO Left 08/17/2014   Procedure: CATARACT EXTRACTION PHACO AND INTRAOCULAR LENS PLACEMENT LEFT EYE;  Surgeon: Cherene Mania, MD;  Location: [facility];  Service: Ophthalmology;  Laterality: Left;  CDE:4.65   CHOLECYSTECTOMY     EYE SURGERY  2015, 2019   cataract, eyelids to help vision   JOINT REPLACEMENT Left    hip   TOTAL HIP ARTHROPLASTY Left 1999   Patient Active Problem List   Diagnosis Date Noted   Essential hypertension 08/23/2017   Osteoporosis 08/26/2014   Hyperlipidemia with target LDL less than 100 08/19/2013   GERD (gastroesophageal reflux disease) 08/19/2013   REFERRING PROVIDER: Jeoffrey Sages PA-C  REFERRING DIAG: Low back pain.  Rationale for Evaluation and Treatment: Rehabilitation  THERAPY DIAG:   Other low back pain  Muscle spasm of back  Abnormal posture  ONSET DATE: November, 2025.    SUBJECTIVE:                                                                                                                                                                                           SUBJECTIVE STATEMENT: The patient presents to the clinic with c/o low back pain that came on in November of 2025.  Her pain is rated at around a 6-7/10.  Standing in one place increases  her pain.  Tylenol  and sitting for short periods of time decrease her pain.  She describes the pain as an ache and sore.  She also report she has been experiencing  some right hip pain.    PERTINENT HISTORY:  OP, left THA.   PAIN:  Are you having pain? Yes: NPRS scale: 6-7/10.   Pain location: Right low back.   Pain description: As above.   Aggravating factors: As above.   Relieving factors: As above.    PRECAUTIONS: Other: OP.  RED FLAGS: None   WEIGHT BEARING RESTRICTIONS: No  FALLS:  Has patient fallen in last 6 months? Yes. Number of falls 1.    LIVING ENVIRONMENT: Lives with: lives with their spouse Lives in: House/apartment Has following equipment at home: None  OCCUPATION: Retired.    PLOF: Independent  PATIENT GOALS: Get out of pain.     OBJECTIVE:   PATIENT SURVEYS:  ODI:  13/50.  POSTURE: flexed trunk   PALPATION: Tender to palpation over right low back (QL) musculature.    LOWER EXTREMITY ROM:     WFL.    LOWER EXTREMITY MMT:    WNL.  LUMBAR SPECIAL TESTS:  Equal leg lengths. (-) SLR testing.     GAIT: Slow and purposeful in some trunk flexion with a decrease in step and stride length.    TREATMENT DATE: 10/08/24:  HMP and IFC at 80-150 Hz on 40% scan x 20 minutes to patient's right low back f/b patient in sdly position with pillow between knees for comfort   while receiving STW/M x 8 minutes.      Normal modality response following removal of modality.                                                                                                                             PATIENT EDUCATION:  Education details:  Person educated:  International aid/development worker:  Education comprehension:   HOME EXERCISE PROGRAM:   ASSESSMENT:  CLINICAL IMPRESSION: The patient presents to [facility] with c/o right-sided low back pain.  She is palpably tender over her right QL.  She walks in some trunk flexion due to pain and her gait is slow and purposeful.  Her ODI score is 13/50.  Patient will benefit from skilled physical therapy intervention to address pain and deficits.     OBJECTIVE IMPAIRMENTS: Abnormal gait, decreased activity tolerance, increased muscle spasms, and pain.   ACTIVITY LIMITATIONS: carrying, lifting, bending, standing, and locomotion level  PARTICIPATION LIMITATIONS: meal prep, cleaning, laundry, shopping, community activity, and yard work  PERSONAL FACTORS: 1 comorbidity: OP are also affecting patient's functional outcome.   REHAB POTENTIAL: Good  CLINICAL DECISION MAKING: Evolving/moderate complexity  EVALUATION COMPLEXITY: Moderate   GOALS:  LONG TERM GOALS: Target date: 01/06/25  Ind with an HEP. Goal status: INITIAL  2.  Perform ADL's with pain not > 3/10.  Goal status: INITIAL  3.  Eliminate right hip pain. Goal status: INITIAL  4.  Improve ODI by at least 3 points.  Goal status: INITIAL  PLAN:  PT FREQUENCY/DURATION:    12 visits.    PLANNED INTERVENTIONS: 97110-Therapeutic exercises, 97530- Therapeutic activity, [id number]- Neuromuscular re-education, 97535- Self Care, 02859- Manual therapy, G0283- Electrical stimulation (unattended), 97035- Ultrasound, Patient/Family education, and Moist heat.  PLAN FOR NEXT SESSION: Core exercise progression.  Body mechanics training and spinal protection techniques.  STW/M and modaliites as needed.      Deborah Pruitt, PT 10/08/2024, 12:46 PM  "

## 2024-10-13 ENCOUNTER — Ambulatory Visit: Admitting: Physical Therapy

## 2024-10-13 DIAGNOSIS — M5459 Other low back pain: Secondary | ICD-10-CM

## 2024-10-13 DIAGNOSIS — R293 Abnormal posture: Secondary | ICD-10-CM

## 2024-10-13 DIAGNOSIS — M62830 Muscle spasm of back: Secondary | ICD-10-CM

## 2024-10-13 NOTE — Therapy (Signed)
 " OUTPATIENT PHYSICAL THERAPY THORACOLUMBAR TREATMENT Patient Name: Deborah Pruitt MRN: [id number] DOB:1940-11-22, 84 y.o., female Today's Date: 10/13/2024  END OF SESSION:    Past Medical History:  Diagnosis Date   Allergy baby   Arthritis    Asthma    Cataract 2015   Complication of anesthesia    Ileus post hip replacement   Femur fracture (HCC) 11/28/2017   GERD (gastroesophageal reflux disease)    Hip fracture, left (HCC) 11/28/2017   Hyperlipidemia    Hypertension    Urinary tract infection 2012   Verrucous skin lesion 10/17/2019   Past Surgical History:  Procedure Laterality Date   CATARACT EXTRACTION W/PHACO Right 07/23/2014   Procedure: CATARACT EXTRACTION PHACO AND INTRAOCULAR LENS PLACEMENT RIGHT EYE CDE=6.24;  Surgeon: Cherene Mania, MD;  Location: [facility];  Service: Ophthalmology;  Laterality: Right;   CATARACT EXTRACTION W/PHACO Left 08/17/2014   Procedure: CATARACT EXTRACTION PHACO AND INTRAOCULAR LENS PLACEMENT LEFT EYE;  Surgeon: Cherene Mania, MD;  Location: [facility];  Service: Ophthalmology;  Laterality: Left;  CDE:4.65   CHOLECYSTECTOMY     EYE SURGERY  2015, 2019   cataract, eyelids to help vision   JOINT REPLACEMENT Left    hip   TOTAL HIP ARTHROPLASTY Left 1999   Patient Active Problem List   Diagnosis Date Noted   Essential hypertension 08/23/2017   Osteoporosis 08/26/2014   Hyperlipidemia with target LDL less than 100 08/19/2013   GERD (gastroesophageal reflux disease) 08/19/2013   REFERRING PROVIDER: Jeoffrey Sages PA-C  REFERRING DIAG: Low back pain.  Rationale for Evaluation and Treatment: Rehabilitation  THERAPY DIAG:  No diagnosis found.  ONSET DATE: November, 2025.    SUBJECTIVE:                                                                                                                                                                                           SUBJECTIVE STATEMENT: Felt really good after first treatment for about 3 hours  or more.  Pain at an 8 right now.  PERTINENT HISTORY:  OP, left THA.   PAIN:  Are you having pain? Yes: NPRS scale: 8/10.   Pain location: Right low back.   Pain description: As above.   Aggravating factors: As above.   Relieving factors: As above.    PRECAUTIONS: Other: OP.  RED FLAGS: None   WEIGHT BEARING RESTRICTIONS: No  FALLS:  Has patient fallen in last 6 months? Yes. Number of falls 1.    LIVING ENVIRONMENT: Lives with: lives with their spouse Lives in: House/apartment Has following equipment at home: None  OCCUPATION: Retired.    PLOF: Independent  PATIENT GOALS: Get out of pain.     OBJECTIVE:   PATIENT SURVEYS:  ODI:  13/50.  POSTURE: flexed trunk   PALPATION: Tender to palpation over right low back (QL) musculature.    LOWER EXTREMITY ROM:     WFL.    LOWER EXTREMITY MMT:    WNL.  LUMBAR SPECIAL TESTS:  Equal leg lengths. (-) SLR testing.     GAIT: Slow and purposeful in some trunk flexion with a decrease in step and stride length.    TREATMENT DATE:   10/13/24:  Nustep level 3 x 15 minutes f/b Right SKTC with one minute holds multiple reps f/b hip bridges 2 x 10.  Patient then in standing and performing standing trunk extension with elbow on wall multiple reps f/b STW/M x 13 minutes f/b  HMP and IFC at 80-150 Hz on 40% scan x 15 minutes to patient's right low backpatient's right low back.  Normal modality response following removal of modality.                                                                                                                           10/08/24:  HMP and IFC at 80-150 Hz on 40% scan x 20 minutes to patient's right low back f/b patient in sdly position with pillow between knees for comfort   while receiving STW/M x 8 minutes.      Normal modality response following removal of modality.                                                                                                                             PATIENT EDUCATION:  Education details: See below. Person educated: Pt.   Education method: Handouts provided.   Education comprehension: Very good.    HOME EXERCISE PROGRAM:  SKTC  [[id number]]  SINGLE KNEE TO CHEST STRETCH - SKTC -  Repeat 3 Repetitions, Hold 1 Minute, Complete 1 Set, Perform 3 Times a Day  Bridges -  Repeat 15 Repetitions, Hold 2 Seconds, Complete 2 Sets, Perform 2 Times a Day ASSESSMENT:  CLINICAL IMPRESSION: Patient did very well today with the addition of therex that will also be part of her HEP.  She performed with minimal cues, performed slowly with very good technique.     OBJECTIVE IMPAIRMENTS: Abnormal gait, decreased activity tolerance, increased muscle spasms, and pain.   ACTIVITY LIMITATIONS: carrying, lifting, bending, standing, and locomotion level  PARTICIPATION LIMITATIONS: meal prep, cleaning, laundry, shopping, community activity, and yard work  PERSONAL FACTORS: 1 comorbidity: OP are also affecting patient's functional outcome.   REHAB POTENTIAL: Good  CLINICAL DECISION MAKING: Evolving/moderate complexity  EVALUATION COMPLEXITY: Moderate   GOALS:  LONG TERM GOALS: Target date: 01/06/25  Ind with an HEP. Goal status: INITIAL  2.  Perform ADL's with pain not > 3/10.  Goal status: INITIAL  3.  Eliminate right hip pain. Goal status: INITIAL  4.  Improve ODI by at least 3 points.  Goal status: INITIAL  PLAN:  PT FREQUENCY/DURATION:    12 visits.    PLANNED INTERVENTIONS: 97110-Therapeutic exercises, 97530- Therapeutic activity, [id number]- Neuromuscular re-education, 97535- Self Care, 02859- Manual therapy, G0283- Electrical stimulation (unattended), 97035- Ultrasound, Patient/Family education, and Moist heat.  PLAN FOR NEXT SESSION: Core exercise progression.  Body mechanics training and spinal protection  techniques.  STW/M and modaliites as needed.      Fermina Mishkin, PT 10/13/2024, 9:20 AM  "

## 2024-10-15 ENCOUNTER — Ambulatory Visit

## 2024-10-15 DIAGNOSIS — M5459 Other low back pain: Secondary | ICD-10-CM

## 2024-10-15 DIAGNOSIS — M62830 Muscle spasm of back: Secondary | ICD-10-CM

## 2024-10-15 DIAGNOSIS — R293 Abnormal posture: Secondary | ICD-10-CM

## 2024-10-15 NOTE — Therapy (Signed)
 " OUTPATIENT PHYSICAL THERAPY THORACOLUMBAR TREATMENT Patient Name: Deborah Pruitt MRN: [id number] DOB:29-Sep-1940, 84 y.o., female Today's Date: 10/15/2024  END OF SESSION:  PT End of Session - 10/15/24 0808     Visit Number 3    Number of Visits 12    Date for Recertification  01/06/25    PT Start Time 0800    PT Stop Time 0845    PT Time Calculation (min) 45 min    Activity Tolerance Patient tolerated treatment well    Behavior During Therapy [facility] for tasks assessed/performed           Past Medical History:  Diagnosis Date   Allergy baby   Arthritis    Asthma    Cataract 2015   Complication of anesthesia    Ileus post hip replacement   Femur fracture (HCC) 11/28/2017   GERD (gastroesophageal reflux disease)    Hip fracture, left (HCC) 11/28/2017   Hyperlipidemia    Hypertension    Urinary tract infection 2012   Verrucous skin lesion 10/17/2019   Past Surgical History:  Procedure Laterality Date   CATARACT EXTRACTION W/PHACO Right 07/23/2014   Procedure: CATARACT EXTRACTION PHACO AND INTRAOCULAR LENS PLACEMENT RIGHT EYE CDE=6.24;  Surgeon: Cherene Mania, MD;  Location: [facility];  Service: Ophthalmology;  Laterality: Right;   CATARACT EXTRACTION W/PHACO Left 08/17/2014   Procedure: CATARACT EXTRACTION PHACO AND INTRAOCULAR LENS PLACEMENT LEFT EYE;  Surgeon: Cherene Mania, MD;  Location: [facility];  Service: Ophthalmology;  Laterality: Left;  CDE:4.65   CHOLECYSTECTOMY     EYE SURGERY  2015, 2019   cataract, eyelids to help vision   JOINT REPLACEMENT Left    hip   TOTAL HIP ARTHROPLASTY Left 1999   Patient Active Problem List   Diagnosis Date Noted   Essential hypertension 08/23/2017   Osteoporosis 08/26/2014   Hyperlipidemia with target LDL less than 100 08/19/2013   GERD (gastroesophageal reflux disease) 08/19/2013   REFERRING PROVIDER: Jeoffrey Sages PA-C  REFERRING DIAG: Low back pain.  Rationale for Evaluation and Treatment: Rehabilitation  THERAPY DIAG:  Other  low back pain  Muscle spasm of back  Abnormal posture  ONSET DATE: November, 2025.    SUBJECTIVE:                                                                                                                                                                                           SUBJECTIVE STATEMENT: Felt really good after first treatment for about 3 hours or more.  Pain at an 8 right now.  PERTINENT HISTORY:  OP, left THA.   PAIN:  Are you having pain?  Yes: NPRS scale: 8/10.   Pain location: Right low back.   Pain description: As above.   Aggravating factors: As above.   Relieving factors: As above.    PRECAUTIONS: Other: OP.  RED FLAGS: None   WEIGHT BEARING RESTRICTIONS: No  FALLS:  Has patient fallen in last 6 months? Yes. Number of falls 1.    LIVING ENVIRONMENT: Lives with: lives with their spouse Lives in: House/apartment Has following equipment at home: None  OCCUPATION: Retired.    PLOF: Independent  PATIENT GOALS: Get out of pain.     OBJECTIVE:   PATIENT SURVEYS:  ODI:  13/50.  POSTURE: flexed trunk   PALPATION: Tender to palpation over right low back (QL) musculature.    LOWER EXTREMITY ROM:     WFL.    LOWER EXTREMITY MMT:    WNL.  LUMBAR SPECIAL TESTS:  Equal leg lengths. (-) SLR testing.     GAIT: Slow and purposeful in some trunk flexion with a decrease in step and stride length.    TREATMENT DATE:    10/15/24                                 EXERCISE LOG  Exercise Repetitions and Resistance Comments  Nustep Lvl 3 x 15 mins   LAQs 2# x 25 reps bil   Seated Marches 2# x 25 reps bil   Seated Hip Abduction Red x 3 mins   Seated Hip Adduction 3 mins   Seated Ham Curls Red x 25 reps bil   SKTC 15 reps RLE only   Bridges 20 reps     Blank cell = exercise not performed today   10/13/24:  Nustep level 3 x 15 minutes f/b Right SKTC with one minute holds multiple reps f/b hip bridges 2 x 10.  Patient then in standing and  performing standing trunk extension with elbow on wall multiple reps f/b STW/M x 13 minutes f/b  HMP and IFC at 80-150 Hz on 40% scan x 15 minutes to patient's right low backpatient's right low back.  Normal modality response following removal of modality.                                                                                                                           10/08/24:  HMP and IFC at 80-150 Hz on 40% scan x 20 minutes to patient's right low back f/b patient in sdly position with pillow between knees for comfort   while receiving STW/M x 8 minutes.      Normal modality response following removal of modality.  PATIENT EDUCATION:  Education details: See below. Person educated: Pt.   Education method: Handouts provided.   Education comprehension: Very good.    HOME EXERCISE PROGRAM:                                                                                                                      SKTC  [[id number]]  SINGLE KNEE TO CHEST STRETCH - SKTC -  Repeat 3 Repetitions, Hold 1 Minute, Complete 1 Set, Perform 3 Times a Day  Bridges -  Repeat 15 Repetitions, Hold 2 Seconds, Complete 2 Sets, Perform 2 Times a Day ASSESSMENT:  CLINICAL IMPRESSION: Pt arrives for today's treatment session denying any pain.  Pt states that she felt very good after last treatment session.  Pt introduced to seated exercises today to increase strength and function.  Pt requiring min cues for proper technique and posture.  Reviewed previously performed supine exercises with good results.  Pt denied any pain at completion of today's treatment session.  OBJECTIVE IMPAIRMENTS: Abnormal gait, decreased activity tolerance, increased muscle spasms, and pain.   ACTIVITY LIMITATIONS: carrying, lifting, bending, standing, and locomotion level  PARTICIPATION LIMITATIONS: meal prep,  cleaning, laundry, shopping, community activity, and yard work  PERSONAL FACTORS: 1 comorbidity: OP are also affecting patient's functional outcome.   REHAB POTENTIAL: Good  CLINICAL DECISION MAKING: Evolving/moderate complexity  EVALUATION COMPLEXITY: Moderate   GOALS:  LONG TERM GOALS: Target date: 01/06/25  Ind with an HEP. Goal status: INITIAL  2.  Perform ADL's with pain not > 3/10.  Goal status: INITIAL  3.  Eliminate right hip pain. Goal status: INITIAL  4.  Improve ODI by at least 3 points.  Goal status: INITIAL  PLAN:  PT FREQUENCY/DURATION:    12 visits.    PLANNED INTERVENTIONS: 97110-Therapeutic exercises, 97530- Therapeutic activity, [id number]- Neuromuscular re-education, 97535- Self Care, 02859- Manual therapy, G0283- Electrical stimulation (unattended), 97035- Ultrasound, Patient/Family education, and Moist heat.  PLAN FOR NEXT SESSION: Core exercise progression.  Body mechanics training and spinal protection techniques.  STW/M and modaliites as needed.      Delon DELENA Gosling, PTA 10/15/2024, 8:53 AM  "

## 2024-10-21 ENCOUNTER — Ambulatory Visit

## 2024-10-23 ENCOUNTER — Ambulatory Visit: Admitting: Physical Therapy

## 2024-10-23 DIAGNOSIS — M5459 Other low back pain: Secondary | ICD-10-CM

## 2024-10-23 DIAGNOSIS — R293 Abnormal posture: Secondary | ICD-10-CM

## 2024-10-23 DIAGNOSIS — M62830 Muscle spasm of back: Secondary | ICD-10-CM

## 2024-10-23 NOTE — Therapy (Signed)
 " OUTPATIENT PHYSICAL THERAPY THORACOLUMBAR TREATMENT Patient Name: Deborah Pruitt MRN: [id number] DOB:04-Jul-1941, 84 y.o., female Today's Date: 10/23/2024  END OF SESSION:  PT End of Session - 10/23/24 1344     Visit Number 4    Number of Visits 12    Date for Recertification  01/06/25    PT Start Time 0135    PT Stop Time 0240    PT Time Calculation (min) 65 min    Activity Tolerance Patient tolerated treatment well    Behavior During Therapy [facility] for tasks assessed/performed           Past Medical History:  Diagnosis Date   Allergy baby   Arthritis    Asthma    Cataract 2015   Complication of anesthesia    Ileus post hip replacement   Femur fracture (HCC) 11/28/2017   GERD (gastroesophageal reflux disease)    Hip fracture, left (HCC) 11/28/2017   Hyperlipidemia    Hypertension    Urinary tract infection 2012   Verrucous skin lesion 10/17/2019   Past Surgical History:  Procedure Laterality Date   CATARACT EXTRACTION W/PHACO Right 07/23/2014   Procedure: CATARACT EXTRACTION PHACO AND INTRAOCULAR LENS PLACEMENT RIGHT EYE CDE=6.24;  Surgeon: Cherene Mania, MD;  Location: [facility];  Service: Ophthalmology;  Laterality: Right;   CATARACT EXTRACTION W/PHACO Left 08/17/2014   Procedure: CATARACT EXTRACTION PHACO AND INTRAOCULAR LENS PLACEMENT LEFT EYE;  Surgeon: Cherene Mania, MD;  Location: [facility];  Service: Ophthalmology;  Laterality: Left;  CDE:4.65   CHOLECYSTECTOMY     EYE SURGERY  2015, 2019   cataract, eyelids to help vision   JOINT REPLACEMENT Left    hip   TOTAL HIP ARTHROPLASTY Left 1999   Patient Active Problem List   Diagnosis Date Noted   Essential hypertension 08/23/2017   Osteoporosis 08/26/2014   Hyperlipidemia with target LDL less than 100 08/19/2013   GERD (gastroesophageal reflux disease) 08/19/2013   REFERRING PROVIDER: Jeoffrey Sages PA-C  REFERRING DIAG: Low back pain.  Rationale for Evaluation and Treatment: Rehabilitation  THERAPY DIAG:  Other  low back pain  Muscle spasm of back  Abnormal posture  ONSET DATE: November, 2025.    SUBJECTIVE:                                                                                                                                                                                           SUBJECTIVE STATEMENT: Getting better.  Good and bad days.  Today, pain is low.  PERTINENT HISTORY:  OP, left THA.   PAIN:  Are you having pain? Yes: NPRS scale: 2/10.   Pain  location: Right low back.   Pain description: As above.   Aggravating factors: As above.   Relieving factors: As above.    PRECAUTIONS: Other: OP.  RED FLAGS: None   WEIGHT BEARING RESTRICTIONS: No  FALLS:  Has patient fallen in last 6 months? Yes. Number of falls 1.    LIVING ENVIRONMENT: Lives with: lives with their spouse Lives in: House/apartment Has following equipment at home: None  OCCUPATION: Retired.    PLOF: Independent  PATIENT GOALS: Get out of pain.     OBJECTIVE:   PATIENT SURVEYS:  ODI:  13/50.  POSTURE: flexed trunk   PALPATION: Tender to palpation over right low back (QL) musculature.    LOWER EXTREMITY ROM:     WFL.    LOWER EXTREMITY MMT:    WNL.  LUMBAR SPECIAL TESTS:  Equal leg lengths. (-) SLR testing.     GAIT: Slow and purposeful in some trunk flexion with a decrease in step and stride length.    TREATMENT DATE:   10/23/24:                                       EXERCISE LOG  Exercise Repetitions and Resistance Comments  Nustep Level 3 x 15 minutes                   Patient in left sdly position with pillow between knees for comfort while receiving combo e'stim/US  at 1.50 W/CM2 x 10 minutes to patient's right low back f/b STW/M x 13 minutes f/b HMP and IFC at 80-150 Hz on 40% scan x 20 minutes.   Normal modality response following removal of modality.     10/15/24                                 EXERCISE LOG  Exercise Repetitions and Resistance Comments   Nustep Lvl 3 x 15 mins   LAQs 2# x 25 reps bil   Seated Marches 2# x 25 reps bil   Seated Hip Abduction Red x 3 mins   Seated Hip Adduction 3 mins   Seated Ham Curls Red x 25 reps bil   SKTC 15 reps RLE only   Bridges 20 reps     Blank cell = exercise not performed today   10/13/24:  Nustep level 3 x 15 minutes f/b Right SKTC with one minute holds multiple reps f/b hip bridges 2 x 10.  Patient then in standing and performing standing trunk extension with elbow on wall multiple reps f/b STW/M x 13 minutes f/b  HMP and IFC at 80-150 Hz on 40% scan x 15 minutes to patient's right low backpatient's right low back.  Normal modality response following removal of modality.  10/08/24:  HMP and IFC at 80-150 Hz on 40% scan x 20 minutes to patient's right low back f/b patient in sdly position with pillow between knees for comfort   while receiving STW/M x 8 minutes.      Normal modality response following removal of modality.                                                                                                                            PATIENT EDUCATION:  Education details: See below. Person educated: Pt.   Education method: Handouts provided.   Education comprehension: Very good.    HOME EXERCISE PROGRAM:                                                                                                                      SKTC  [[id number]]  SINGLE KNEE TO CHEST STRETCH - SKTC -  Repeat 3 Repetitions, Hold 1 Minute, Complete 1 Set, Perform 3 Times a Day  Bridges -  Repeat 15 Repetitions, Hold 2 Seconds, Complete 2 Sets, Perform 2 Times a Day ASSESSMENT:  CLINICAL IMPRESSION: Patient reporting PT is helping.  Her right QL exhibits increased tone.  She did well with STW/M today.    OBJECTIVE IMPAIRMENTS: Abnormal gait, decreased activity tolerance, increased muscle  spasms, and pain.   ACTIVITY LIMITATIONS: carrying, lifting, bending, standing, and locomotion level  PARTICIPATION LIMITATIONS: meal prep, cleaning, laundry, shopping, community activity, and yard work  PERSONAL FACTORS: 1 comorbidity: OP are also affecting patient's functional outcome.   REHAB POTENTIAL: Good  CLINICAL DECISION MAKING: Evolving/moderate complexity  EVALUATION COMPLEXITY: Moderate   GOALS:  LONG TERM GOALS: Target date: 01/06/25  Ind with an HEP. Goal status: INITIAL  2.  Perform ADL's with pain not > 3/10.  Goal status: INITIAL  3.  Eliminate right hip pain. Goal status: INITIAL  4.  Improve ODI by at least 3 points.  Goal status: INITIAL  PLAN:  PT FREQUENCY/DURATION:    12 visits.    PLANNED INTERVENTIONS: 97110-Therapeutic exercises, 97530- Therapeutic activity, [id number]- Neuromuscular re-education, 97535- Self Care, 02859- Manual therapy, G0283- Electrical stimulation (unattended), 97035- Ultrasound, Patient/Family education, and Moist heat.  PLAN FOR NEXT SESSION: Core exercise progression.  Body mechanics training and spinal protection techniques.  STW/M and modaliites as needed.      Tashara Suder, PT 10/23/2024, 3:07 PM  "

## 2024-10-29 ENCOUNTER — Ambulatory Visit: Payer: Self-pay

## 2024-10-29 DIAGNOSIS — R293 Abnormal posture: Secondary | ICD-10-CM

## 2024-10-29 DIAGNOSIS — M62830 Muscle spasm of back: Secondary | ICD-10-CM

## 2024-10-29 DIAGNOSIS — M5459 Other low back pain: Secondary | ICD-10-CM

## 2024-10-29 NOTE — Therapy (Signed)
 " OUTPATIENT PHYSICAL THERAPY THORACOLUMBAR TREATMENT Patient Name: Deborah Pruitt MRN: [id number] DOB:1941/04/21, 84 y.o., female Today's Date: 10/29/2024  END OF SESSION:  PT End of Session - 10/29/24 1314     Visit Number 5    Number of Visits 12    Date for Recertification  01/06/25    PT Start Time 1300    PT Stop Time 1359    PT Time Calculation (min) 59 min    Activity Tolerance Patient tolerated treatment well    Behavior During Therapy [facility] for tasks assessed/performed           Past Medical History:  Diagnosis Date   Allergy baby   Arthritis    Asthma    Cataract 2015   Complication of anesthesia    Ileus post hip replacement   Femur fracture (HCC) 11/28/2017   GERD (gastroesophageal reflux disease)    Hip fracture, left (HCC) 11/28/2017   Hyperlipidemia    Hypertension    Urinary tract infection 2012   Verrucous skin lesion 10/17/2019   Past Surgical History:  Procedure Laterality Date   CATARACT EXTRACTION W/PHACO Right 07/23/2014   Procedure: CATARACT EXTRACTION PHACO AND INTRAOCULAR LENS PLACEMENT RIGHT EYE CDE=6.24;  Surgeon: Cherene Mania, MD;  Location: [facility];  Service: Ophthalmology;  Laterality: Right;   CATARACT EXTRACTION W/PHACO Left 08/17/2014   Procedure: CATARACT EXTRACTION PHACO AND INTRAOCULAR LENS PLACEMENT LEFT EYE;  Surgeon: Cherene Mania, MD;  Location: [facility];  Service: Ophthalmology;  Laterality: Left;  CDE:4.65   CHOLECYSTECTOMY     EYE SURGERY  2015, 2019   cataract, eyelids to help vision   JOINT REPLACEMENT Left    hip   TOTAL HIP ARTHROPLASTY Left 1999   Patient Active Problem List   Diagnosis Date Noted   Essential hypertension 08/23/2017   Osteoporosis 08/26/2014   Hyperlipidemia with target LDL less than 100 08/19/2013   GERD (gastroesophageal reflux disease) 08/19/2013   REFERRING PROVIDER: Jeoffrey Sages PA-C  REFERRING DIAG: Low back pain.  Rationale for Evaluation and Treatment: Rehabilitation  THERAPY DIAG:  Other  low back pain  Muscle spasm of back  Abnormal posture  ONSET DATE: November, 2025.    SUBJECTIVE:                                                                                                                                                                                           SUBJECTIVE STATEMENT: Getting better.  Good and bad days.  Today, pain is low.  PERTINENT HISTORY:  OP, left THA.   PAIN:  Are you having pain? Yes: NPRS scale: 2/10.   Pain  location: Right low back.   Pain description: As above.   Aggravating factors: As above.   Relieving factors: As above.    PRECAUTIONS: Other: OP.  RED FLAGS: None   WEIGHT BEARING RESTRICTIONS: No  FALLS:  Has patient fallen in last 6 months? Yes. Number of falls 1.    LIVING ENVIRONMENT: Lives with: lives with their spouse Lives in: House/apartment Has following equipment at home: None  OCCUPATION: Retired.    PLOF: Independent  PATIENT GOALS: Get out of pain.     OBJECTIVE:   PATIENT SURVEYS:  ODI:  13/50.  POSTURE: flexed trunk   PALPATION: Tender to palpation over right low back (QL) musculature.    LOWER EXTREMITY ROM:     WFL.    LOWER EXTREMITY MMT:    WNL.  LUMBAR SPECIAL TESTS:  Equal leg lengths. (-) SLR testing.     GAIT: Slow and purposeful in some trunk flexion with a decrease in step and stride length.    TREATMENT DATE:   10/29/24                                 EXERCISE LOG  Exercise Repetitions and Resistance Comments  Nustep Lvl 3 x 17 mins   LAQs 2# x 30 reps bil   Seated Marches 2# x 30 reps bil   Seated Hip Abduction Red x 3 mins   Seated Hip Adduction 3 mins   Seated Ham Curls Red x 30 reps bil   SKTC    Bridges     Blank cell = exercise not performed today  Modalities  Date:  Unattended Estim: Lumbar, IFC 80-150 hz, 15 mins, Pain and Tone Hot Pack: Lumbar, 15 mins, Pain and Tone                                 10/23/24:                        EXERCISE  LOG  Exercise Repetitions and Resistance Comments  Nustep Level 3 x 15 minutes                   Patient in left sdly position with pillow between knees for comfort while receiving combo e'stim/US  at 1.50 W/CM2 x 10 minutes to patient's right low back f/b STW/M x 13 minutes f/b HMP and IFC at 80-150 Hz on 40% scan x 20 minutes.   Normal modality response following removal of modality.     10/15/24                                 EXERCISE LOG  Exercise Repetitions and Resistance Comments  Nustep Lvl 3 x 15 mins   LAQs 2# x 25 reps bil   Seated Marches 2# x 25 reps bil   Seated Hip Abduction Red x 3 mins   Seated Hip Adduction 3 mins   Seated Ham Curls Red x 25 reps bil   SKTC 15 reps RLE only   Bridges 20 reps     Blank cell = exercise not performed today   10/13/24:  Nustep level 3 x 15 minutes f/b Right SKTC with one minute holds multiple reps f/b hip bridges 2 x 10.  Patient then in standing  and performing standing trunk extension with elbow on wall multiple reps f/b STW/M x 13 minutes f/b  HMP and IFC at 80-150 Hz on 40% scan x 15 minutes to patient's right low backpatient's right low back.  Normal modality response following removal of modality.                                                                                                                                                                                                                                          PATIENT EDUCATION:  Education details: See below. Person educated: Pt.   Education method: Handouts provided.   Education comprehension: Very good.    HOME EXERCISE PROGRAM:                                                                                                                      SKTC  [[id number]]  SINGLE KNEE TO CHEST STRETCH - SKTC -  Repeat 3 Repetitions, Hold 1 Minute, Complete 1 Set, Perform 3 Times a Day  Bridges -  Repeat 15 Repetitions, Hold 2 Seconds, Complete 2 Sets, Perform 2 Times a  Day ASSESSMENT:  CLINICAL IMPRESSION: Pt arrives for today's treatment session reporting 5/10 low back pain.  Pt reported increases soreness and stiffness after last treatment session.  Due to this combo not performed today.  Pt able to tolerate increased reps with all previously performed seated exercises. Normal responses to estim and MH noted upon removal.  Pt reported decreased pain at completion of today's treatment session.     OBJECTIVE IMPAIRMENTS: Abnormal gait, decreased activity tolerance, increased muscle spasms, and pain.   ACTIVITY LIMITATIONS: carrying, lifting, bending, standing, and locomotion level  PARTICIPATION LIMITATIONS: meal prep, cleaning, laundry, shopping, community activity, and yard work  PERSONAL FACTORS: 1 comorbidity: OP are also affecting patient's functional outcome.   REHAB POTENTIAL: Good  CLINICAL DECISION MAKING: Evolving/moderate complexity  EVALUATION COMPLEXITY: Moderate   GOALS:  LONG TERM GOALS: Target date: 01/06/25  Ind with an HEP. Goal status: INITIAL  2.  Perform ADL's with pain not > 3/10.  Goal status: INITIAL  3.  Eliminate right hip pain. Goal status: INITIAL  4.  Improve ODI by at least 3 points.  Goal status: INITIAL  PLAN:  PT FREQUENCY/DURATION:    12 visits.    PLANNED INTERVENTIONS: 97110-Therapeutic exercises, 97530- Therapeutic activity, [id number]- Neuromuscular re-education, 97535- Self Care, 02859- Manual therapy, G0283- Electrical stimulation (unattended), 97035- Ultrasound, Patient/Family education, and Moist heat.  PLAN FOR NEXT SESSION: Core exercise progression.  Body mechanics training and spinal protection techniques.  STW/M and modaliites as needed.      Delon DELENA Gosling, PTA 10/29/2024, 2:44 PM  "

## 2024-11-03 ENCOUNTER — Ambulatory Visit: Payer: Self-pay | Admitting: Physical Therapy

## 2024-11-05 ENCOUNTER — Ambulatory Visit: Payer: Self-pay

## 2025-03-26 ENCOUNTER — Ambulatory Visit: Payer: Self-pay

## 2025-03-31 ENCOUNTER — Ambulatory Visit

## 2025-07-10 ENCOUNTER — Encounter: Payer: Self-pay | Admitting: Family Medicine
